# Patient Record
Sex: Male | Born: 1940
Health system: Southern US, Community
[De-identification: ages and names within clinical notes are randomized; demographics above are authoritative.]

## PROBLEM LIST (undated history)

## (undated) DIAGNOSIS — Z86010 Personal history of colonic polyps: Secondary | ICD-10-CM

## (undated) DIAGNOSIS — Z8719 Personal history of other diseases of the digestive system: Secondary | ICD-10-CM

## (undated) DIAGNOSIS — I10 Essential (primary) hypertension: Secondary | ICD-10-CM

## (undated) DIAGNOSIS — R918 Other nonspecific abnormal finding of lung field: Secondary | ICD-10-CM

## (undated) DIAGNOSIS — D499 Neoplasm of unspecified behavior of unspecified site: Secondary | ICD-10-CM

## (undated) DIAGNOSIS — R51 Headache: Secondary | ICD-10-CM

## (undated) DIAGNOSIS — E663 Overweight: Secondary | ICD-10-CM

## (undated) DIAGNOSIS — C801 Malignant (primary) neoplasm, unspecified: Secondary | ICD-10-CM

## (undated) DIAGNOSIS — R519 Headache, unspecified: Secondary | ICD-10-CM

## (undated) DIAGNOSIS — E785 Hyperlipidemia, unspecified: Secondary | ICD-10-CM

## (undated) DIAGNOSIS — Z8669 Personal history of other diseases of the nervous system and sense organs: Secondary | ICD-10-CM

## (undated) DIAGNOSIS — Z87442 Personal history of urinary calculi: Secondary | ICD-10-CM

## (undated) DIAGNOSIS — K219 Gastro-esophageal reflux disease without esophagitis: Secondary | ICD-10-CM

## (undated) DIAGNOSIS — D509 Iron deficiency anemia, unspecified: Secondary | ICD-10-CM

## (undated) HISTORY — DX: Neoplasm of unspecified behavior of unspecified site: D49.9

## (undated) HISTORY — DX: Gastro-esophageal reflux disease without esophagitis: K21.9

## (undated) HISTORY — DX: Other nonspecific abnormal finding of lung field: R91.8

## (undated) HISTORY — DX: Essential (primary) hypertension: I10

## (undated) HISTORY — DX: Personal history of other diseases of the nervous system and sense organs: Z86.69

## (undated) HISTORY — PX: OTHER SURGICAL HISTORY: SHX169

## (undated) HISTORY — DX: Iron deficiency anemia, unspecified: D50.9

## (undated) HISTORY — DX: Overweight: E66.3

## (undated) HISTORY — DX: Hyperlipidemia, unspecified: E78.5

## (undated) HISTORY — DX: Personal history of colonic polyps: Z86.010

---

## 2001-10-20 DIAGNOSIS — E785 Hyperlipidemia, unspecified: Secondary | ICD-10-CM

## 2001-10-20 DIAGNOSIS — I10 Essential (primary) hypertension: Secondary | ICD-10-CM

## 2001-10-20 HISTORY — DX: Hyperlipidemia, unspecified: E78.5

## 2001-10-20 HISTORY — DX: Essential (primary) hypertension: I10

## 2003-02-15 ENCOUNTER — Ambulatory Visit (HOSPITAL_COMMUNITY): Admission: RE | Admit: 2003-02-15 | Discharge: 2003-02-15 | Payer: Self-pay | Admitting: Family Medicine

## 2003-02-15 ENCOUNTER — Encounter: Payer: Self-pay | Admitting: Family Medicine

## 2003-03-22 ENCOUNTER — Ambulatory Visit (HOSPITAL_COMMUNITY): Admission: RE | Admit: 2003-03-22 | Discharge: 2003-03-22 | Payer: Self-pay | Admitting: General Surgery

## 2006-10-20 HISTORY — PX: COLONOSCOPY: SHX174

## 2006-12-28 ENCOUNTER — Ambulatory Visit: Payer: Self-pay | Admitting: Gastroenterology

## 2007-01-05 ENCOUNTER — Encounter (INDEPENDENT_AMBULATORY_CARE_PROVIDER_SITE_OTHER): Payer: Self-pay | Admitting: *Deleted

## 2007-01-05 ENCOUNTER — Ambulatory Visit: Payer: Self-pay | Admitting: Gastroenterology

## 2007-01-05 ENCOUNTER — Ambulatory Visit (HOSPITAL_COMMUNITY): Admission: RE | Admit: 2007-01-05 | Discharge: 2007-01-05 | Payer: Self-pay | Admitting: Gastroenterology

## 2007-01-05 HISTORY — PX: ESOPHAGOGASTRODUODENOSCOPY: SHX1529

## 2007-01-20 ENCOUNTER — Ambulatory Visit (HOSPITAL_COMMUNITY): Admission: RE | Admit: 2007-01-20 | Discharge: 2007-01-20 | Payer: Self-pay | Admitting: Gastroenterology

## 2007-01-20 HISTORY — PX: GIVENS CAPSULE STUDY: SHX5432

## 2007-01-22 ENCOUNTER — Ambulatory Visit: Payer: Self-pay | Admitting: Gastroenterology

## 2007-02-24 ENCOUNTER — Ambulatory Visit (HOSPITAL_COMMUNITY): Admission: RE | Admit: 2007-02-24 | Discharge: 2007-02-24 | Payer: Self-pay | Admitting: Gastroenterology

## 2007-02-24 ENCOUNTER — Ambulatory Visit: Payer: Self-pay | Admitting: Gastroenterology

## 2007-02-24 ENCOUNTER — Encounter: Payer: Self-pay | Admitting: Family Medicine

## 2007-02-24 DIAGNOSIS — Z860100 Personal history of colon polyps, unspecified: Secondary | ICD-10-CM

## 2007-02-24 HISTORY — DX: Personal history of colonic polyps: Z86.010

## 2007-02-24 HISTORY — DX: Personal history of colon polyps, unspecified: Z86.0100

## 2007-07-08 ENCOUNTER — Encounter: Payer: Self-pay | Admitting: Family Medicine

## 2008-03-06 ENCOUNTER — Ambulatory Visit (HOSPITAL_COMMUNITY): Admission: RE | Admit: 2008-03-06 | Discharge: 2008-03-06 | Payer: Self-pay | Admitting: Family Medicine

## 2008-04-10 ENCOUNTER — Ambulatory Visit (HOSPITAL_COMMUNITY): Admission: RE | Admit: 2008-04-10 | Discharge: 2008-04-10 | Payer: Self-pay | Admitting: Family Medicine

## 2008-04-10 ENCOUNTER — Encounter: Payer: Self-pay | Admitting: Family Medicine

## 2008-06-23 ENCOUNTER — Emergency Department (HOSPITAL_COMMUNITY): Admission: EM | Admit: 2008-06-23 | Discharge: 2008-06-23 | Payer: Self-pay | Admitting: Emergency Medicine

## 2008-07-11 ENCOUNTER — Encounter: Payer: Self-pay | Admitting: Family Medicine

## 2008-09-07 ENCOUNTER — Encounter: Payer: Self-pay | Admitting: Family Medicine

## 2008-09-07 ENCOUNTER — Ambulatory Visit (HOSPITAL_COMMUNITY): Admission: RE | Admit: 2008-09-07 | Discharge: 2008-09-07 | Payer: Self-pay | Admitting: Family Medicine

## 2008-09-18 ENCOUNTER — Emergency Department (HOSPITAL_COMMUNITY): Admission: EM | Admit: 2008-09-18 | Discharge: 2008-09-18 | Payer: Self-pay | Admitting: Emergency Medicine

## 2009-03-16 ENCOUNTER — Ambulatory Visit (HOSPITAL_COMMUNITY): Admission: RE | Admit: 2009-03-16 | Discharge: 2009-03-16 | Payer: Self-pay | Admitting: Family Medicine

## 2009-03-16 ENCOUNTER — Encounter: Payer: Self-pay | Admitting: Family Medicine

## 2009-09-24 ENCOUNTER — Encounter: Payer: Self-pay | Admitting: Family Medicine

## 2009-09-24 ENCOUNTER — Ambulatory Visit (HOSPITAL_COMMUNITY): Admission: RE | Admit: 2009-09-24 | Discharge: 2009-09-24 | Payer: Self-pay | Admitting: Family Medicine

## 2010-07-23 ENCOUNTER — Ambulatory Visit: Payer: Self-pay | Admitting: Family Medicine

## 2010-07-23 DIAGNOSIS — R5381 Other malaise: Secondary | ICD-10-CM | POA: Insufficient documentation

## 2010-07-23 DIAGNOSIS — E785 Hyperlipidemia, unspecified: Secondary | ICD-10-CM | POA: Insufficient documentation

## 2010-07-23 DIAGNOSIS — I10 Essential (primary) hypertension: Secondary | ICD-10-CM | POA: Insufficient documentation

## 2010-07-23 DIAGNOSIS — R5383 Other fatigue: Secondary | ICD-10-CM

## 2010-07-30 ENCOUNTER — Encounter: Payer: Self-pay | Admitting: Physician Assistant

## 2010-08-09 ENCOUNTER — Encounter (INDEPENDENT_AMBULATORY_CARE_PROVIDER_SITE_OTHER): Payer: Self-pay | Admitting: *Deleted

## 2010-08-09 LAB — CONVERTED CEMR LAB
ALT: 17 units/L
ALT: 17 units/L (ref 0–53)
AST: 18 units/L
AST: 18 units/L (ref 0–37)
Albumin: 4.6 g/dL
Albumin: 4.6 g/dL (ref 3.5–5.2)
Alkaline Phosphatase: 49 units/L
Alkaline Phosphatase: 49 units/L (ref 39–117)
BUN: 15 mg/dL
BUN: 15 mg/dL (ref 6–23)
Bilirubin, Direct: 52 mg/dL
CO2: 27 meq/L
CO2: 27 meq/L (ref 19–32)
Calcium: 9.4 mg/dL
Calcium: 9.4 mg/dL (ref 8.4–10.5)
Chloride: 106 meq/L
Chloride: 106 meq/L (ref 96–112)
Cholesterol: 211 mg/dL — ABNORMAL HIGH (ref 0–200)
Creatinine, Ser: 1.37 mg/dL
Creatinine, Ser: 1.37 mg/dL (ref 0.40–1.50)
Glomerular Filtration Rate, Af Am: >60 mL/min/{1.73_m2}
Glucose, Bld: 102 mg/dL
Glucose, Bld: 102 mg/dL — ABNORMAL HIGH (ref 70–99)
HCT: 39.8 % (ref 39.0–52.0)
HDL: 48 mg/dL (ref >39)
Hemoglobin: 12.9 g/dL — ABNORMAL LOW (ref 13.0–17.0)
LDL Cholesterol: 127 mg/dL — ABNORMAL HIGH (ref 0–99)
MCHC: 32.4 g/dL (ref 30.0–36.0)
MCV: 88.1 fL (ref 78.0–100.0)
PSA: 0.5 ng/mL (ref 0.10–4.00)
Platelets: 229 10*3/uL (ref 150–400)
Potassium: 4.4 meq/L
Potassium: 4.4 meq/L (ref 3.5–5.3)
RBC: 4.52 M/uL (ref 4.22–5.81)
RDW: 14.3 % (ref 11.5–15.5)
Sodium: 142 meq/L
Sodium: 142 meq/L (ref 135–145)
TSH: 2.003 microintl units/mL (ref 0.350–4.500)
Total Bilirubin: 0.5 mg/dL (ref 0.3–1.2)
Total CHOL/HDL Ratio: 4.4
Total Protein: 6.8 g/dL
Total Protein: 6.8 g/dL (ref 6.0–8.3)
Triglycerides: 181 mg/dL — ABNORMAL HIGH (ref <150)
VLDL: 36 mg/dL (ref 0–40)
WBC: 7.3 10*3/uL (ref 4.0–10.5)

## 2010-08-12 ENCOUNTER — Ambulatory Visit: Payer: Self-pay | Admitting: Family Medicine

## 2010-08-12 LAB — CONVERTED CEMR LAB
Bilirubin Urine: NEGATIVE
Glucose, Urine, Semiquant: NEGATIVE
Ketones, urine, test strip: NEGATIVE
Nitrite: NEGATIVE
OCCULT 1: NEGATIVE
Protein, U semiquant: NEGATIVE
Specific Gravity, Urine: 1.025
Urobilinogen, UA: 0.2
WBC Urine, dipstick: NEGATIVE
pH: 5.5

## 2010-08-14 ENCOUNTER — Encounter: Payer: Self-pay | Admitting: Family Medicine

## 2010-08-14 LAB — CONVERTED CEMR LAB
Bacteria, UA: NONE SEEN
Bilirubin Urine: NEGATIVE
Casts: NONE SEEN /lpf
Crystals: NONE SEEN
Hemoglobin, Urine: NEGATIVE
Ketones, ur: NEGATIVE mg/dL
Leukocytes, UA: NEGATIVE
Nitrite: NEGATIVE
Protein, ur: NEGATIVE mg/dL
Specific Gravity, Urine: 1.021 (ref 1.005–1.030)
Squamous Epithelial / LPF: NONE SEEN /lpf
Urine Glucose: NEGATIVE mg/dL
Urobilinogen, UA: 0.2 (ref 0.0–1.0)
pH: 5.5 (ref 5.0–8.0)

## 2010-08-18 DIAGNOSIS — J984 Other disorders of lung: Secondary | ICD-10-CM

## 2010-08-18 DIAGNOSIS — R930 Abnormal findings on diagnostic imaging of skull and head, not elsewhere classified: Secondary | ICD-10-CM

## 2010-08-18 DIAGNOSIS — R9431 Abnormal electrocardiogram [ECG] [EKG]: Secondary | ICD-10-CM

## 2010-08-18 HISTORY — DX: Other disorders of lung: J98.4

## 2010-08-18 HISTORY — DX: Abnormal findings on diagnostic imaging of skull and head, not elsewhere classified: R93.0

## 2010-08-18 HISTORY — DX: Abnormal electrocardiogram (ECG) (EKG): R94.31

## 2010-08-19 ENCOUNTER — Encounter: Payer: Self-pay | Admitting: Family Medicine

## 2010-08-19 ENCOUNTER — Encounter: Payer: Self-pay | Admitting: Cardiology

## 2010-08-21 ENCOUNTER — Encounter (INDEPENDENT_AMBULATORY_CARE_PROVIDER_SITE_OTHER): Payer: Self-pay | Admitting: *Deleted

## 2010-08-27 ENCOUNTER — Ambulatory Visit: Payer: Self-pay | Admitting: Cardiology

## 2010-08-30 ENCOUNTER — Ambulatory Visit (HOSPITAL_COMMUNITY): Admission: RE | Admit: 2010-08-30 | Discharge: 2010-08-30 | Payer: Self-pay | Admitting: Family Medicine

## 2010-09-09 ENCOUNTER — Telehealth: Payer: Self-pay | Admitting: Family Medicine

## 2010-10-18 ENCOUNTER — Encounter: Payer: Self-pay | Admitting: Family Medicine

## 2010-10-18 ENCOUNTER — Telehealth: Payer: Self-pay | Admitting: Family Medicine

## 2010-10-18 ENCOUNTER — Ambulatory Visit
Admission: RE | Admit: 2010-10-18 | Discharge: 2010-10-18 | Payer: Self-pay | Source: Home / Self Care | Attending: Family Medicine | Admitting: Family Medicine

## 2010-11-19 NOTE — Progress Notes (Signed)
Summary: medicine  Phone Note Call from Patient   Summary of Call: needs his simas. and his lis. called in at [facility] in [place] and 90 day supply please  left message Initial call taken by: Lind Guest,  September 09, 2010 8:18 AM    Prescriptions: LISINOPRIL 40 MG TABS (LISINOPRIL) Take 1 tablet by mouth once a day  #90 x 0   Entered by:   Adella Hare LPN   Authorized by:   Syliva Overman MD   Signed by:   Adella Hare LPN on 19/14/7829   Method used:   Electronically to        [facility]  [address] (retail)       [address]       [place], [place]  [address]       Ph: [phone]       Fax: [phone]   RxID:   [id number] SIMVASTATIN 40 MG TABS (SIMVASTATIN) Take 1 tab by mouth at bedtime  #90 x 0   Entered by:   Adella Hare LPN   Authorized by:   Syliva Overman MD   Signed by:   Adella Hare LPN on 74/25/9563   Method used:   Electronically to        [facility]  [address] (retail)       [address]       [place], [place]  [address]       Ph: [phone]       Fax: [phone]   RxID:   [id number]

## 2010-11-19 NOTE — Assessment & Plan Note (Signed)
Summary: ***NP3 ABNORMAL EKG   Visit Type:  Initial Consult Primary Provider:  Syliva Overman MD   History of Present Illness: [name] is evaluated in the office today at the kind request of Dr. Garnette [name] for assessment of EKG abnormalities.  This nice gentleman has enjoyed generally excellent health.  He has no known cardiac disease, has never been evaluated by a cardiologist nor has he undergone any significant cardiac testing.  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  He has a history of hypertension and hyperlipidemia, both of which have been well-controlled.  He has no known cerebrovascular nor peripheral vascular disease nor any symptoms of same.  A recent EKG in Dr. Anthony [name] office was interpreted as demonstrating atrial and ventricular ectopy.  This tracing as well as other records were obtained and reviewed from Dr. Lodema [name].  There was a sinus arrhythmia and artifact, but no ectopy.  The tracing was otherwise normal except for a borderline first-degree AV block and right bundle branch block pattern.  Current Medications (verified): 1)  Simvastatin 40 Mg Tabs (Simvastatin) .... Take 1 Tab By Mouth At Bedtime 2)  Lisinopril 40 Mg Tabs (Lisinopril) .... Take 1 Tablet By Mouth Once A Day 3)  Aspirin 81 Mg Tbec (Aspirin) .... Take 1 Tablet By Mouth Once A Day 4)  Multivitamins  Tabs (Multiple Vitamin) .... Take 1 Tablet By Mouth Once A Day  Allergies: No Known Drug Allergies  Comments:  Nurse/Medical Assistant: patient stated that all meds are correct with what TylerSimpson has listed [facility] [place]  Past History:  Past Medical History: Last updated: 08/12/2010 Hypertension dx 03-10-02  (approx) Hyperlipidemia dx  Mar 10, 2002 (approx) Migraines in childhood, flared up in 03/10/08, and have responded to depakote which was stopped in 9 months, none since Cataract in 03-10-2010, 1  eye lung nodules since 03/10/2008, needs rept scan in 08/2010 skin cancer, pt report, old note says seborreic keratoses, will request path  dx in 11-Mar-2007 two lesions removed, [place] Obesity, dx 03-10-2010  Family History: Last updated: 08/12/2010 Mother-living-Pacemaker, Alzheimers age 46 in Mar 10, 2010 Father-deceased-Heart failure, died in late 39's 2 sisters-healthy 1 brother-bradycardia  Social History: Last updated: 08/12/2010 Occupation: retired Scientist, physiological Married x twice, current is 45 yrs Quit nicoine 10-Mar-2005 chewing Alcohol use-no Drug use-no Regular exercise-yes  Past Surgical History: Removal of skin cancer twice Colonoscopy in 2008-excision of 2 small benign polyps  EKG  Procedure date:  08/27/2010  Findings:      Normal sinus rhythm PR interval at the upper limit of normal Right bundle branch block pattern Early R wave progression-possible lead placement error No significant change since a previous tracing of 08/12/10   Review of Systems       occasional headaches; develops motion sickness; requires corrective lenses; all other systems reviewed and are negative.  Vital Signs:  Patient profile:   70 year old male Weight:      201 pounds BMI:     29.79 Pulse rate:   74 / minute BP sitting:   122 / 80  (right arm)  Vitals Entered By: [name] Saa, CNA (August 27, 2010 1:09 PM)  Physical Exam  General:   General-Well-developed; no acute distress: HEENT-[place]/AT; PERRL; EOM intact; conjunctiva and lids nl:  Neck-No JVD; no carotid bruits: Endocrine-No thyromegaly: Lungs-No tachypnea, clear without rales, rhonchi or wheezes: CV-normal PMI; normal S1 and S2:;  Abdomen-BS normal; soft and non-tender without  masses or organomegaly: MS-No deformities, cyanosis or clubbing: Neurologic-Nl cranial nerves; symmetric strength and tone: Skin- Warm, no sig. lesions: Extremities-Nl distal pulses; no edema    Impression & Recommendations:  Problem # 1:   ELECTROCARDIOGRAM, ABNORMAL (ICD-794.31) Mr. [name] does have a number of cardiovascular risk factors, but no symptoms suggesting myocardial ischemia.  His EKG abnormalities are insignificant.  He requires no additional cardiology followup nor cardiac testing.  Continued effective therapy for hyperlipidemia and hypertension is appropriate and will be left to the discretion of Dr. Lodema [name], who has done well in this regard to date.  I will be available to assist with [name] [name]'s care as needed in the future.

## 2010-11-19 NOTE — Miscellaneous (Signed)
Summary: labs cmp 08/09/2010  Clinical Lists Changes  Observations: Added new observation of CALCIUM: 9.4 mg/dL (09/81/1914 7:82) Added new observation of ALBUMIN: 4.6 g/dL (95/62/1308 6:57) Added new observation of PROTEIN, TOT: 6.8 g/dL (84/69/6295 2:84) Added new observation of SGPT (ALT): 17 units/L (08/09/2010 8:39) Added new observation of SGOT (AST): 18 units/L (08/09/2010 8:39) Added new observation of ALK PHOS: 49 units/L (08/09/2010 8:39) Added new observation of BILI DIRECT: 52 mg/dL (13/24/4010 2:72) Added new observation of GFR AA: >60 mL/min/1.39m2 (08/09/2010 8:39) Added new observation of CREATININE: 1.37 mg/dL (53/66/4403 4:74) Added new observation of BUN: 15 mg/dL (25/95/6387 5:64) Added new observation of BG RANDOM: 102 mg/dL (33/29/5188 4:16) Added new observation of CO2 PLSM/SER: 27 meq/L (08/09/2010 8:39) Added new observation of CL SERUM: 106 meq/L (08/09/2010 8:39) Added new observation of K SERUM: 4.4 meq/L (08/09/2010 8:39) Added new observation of NA: 142 meq/L (08/09/2010 8:39)

## 2010-11-19 NOTE — Letter (Signed)
Summary: [facility]   Imported By: Lind Guest 08/19/2010 14:53:02  _____________________________________________________________________  External Attachment:    Type:   Image     Comment:   External Document

## 2010-11-19 NOTE — Assessment & Plan Note (Signed)
Summary: NEW  PATIENT  PER DR BRENDAS HUSBAND room 1   Vital Signs:  Patient profile:   70 year old male Height:      69 inches Weight:      210 pounds BMI:     31.12 O2 Sat:      98 % Pulse rate:   73 / minute Resp:     16 per minute BP sitting:   114 / 74  (left arm) Cuff size:   large  Vitals Entered By: Everitt Amber LPN CC: New Patient   CC:  New Patient.  History of Present Illness: New pt here to establish care with new PCP. Pt states he has had cold symptoms x approx 1 week and no improvement.  Reports nasal congestion and post nasal drainage. Mucus is mostly clear.  He thinks this was triggered by baling hay.  Little cough, nonprod.  No fever or chills.  Has been using Excedrin and no help.  Has an appt this mos with Dr Lodema Hong for CPE.  Has been 1 yr since his previous. Hx of htn. Taking medications as prescribed and denies side effects.  No headache, chest pain or palpitations.    Current Medications (verified): 1)  Simvastatin 40 Mg Tabs (Simvastatin) .... Take 1 Tab By Mouth At Bedtime 2)  Lisinopril 40 Mg Tabs (Lisinopril) .... Take 1 Tablet By Mouth Once A Day  Allergies (verified): No Known Drug Allergies  Past History:  Past medical, surgical, family and social histories (including risk factors) reviewed for relevance to current acute and chronic problems.  Past Medical History: Hypertension Hyperlipidemia  Past Surgical History: Denies surgical history  Family History: Reviewed history and no changes required. Mother-living-Pacemaker, Alzheimers Father-deceased-Heart failure 2 sisters-healthy 1 brother-bradycardia  Social History: Reviewed history and no changes required. Occupation: retired Married Never Smoked Alcohol use-no Drug use-no Regular exercise-yes Occupation:  employed Smoking Status:  never Drug Use:  no Does Patient Exercise:  yes  Review of Systems General:  Denies chills and fever. ENT:  Complains of nasal  congestion and postnasal drainage; denies earache, sinus pressure, and sore throat. CV:  Denies chest pain or discomfort and palpitations. Resp:  Complains of cough; denies shortness of breath, sputum productive, and wheezing.  Physical Exam  General:  Well-developed,well-nourished,in no acute distress; alert,appropriate and cooperative throughout examination Head:  Normocephalic and atraumatic without obvious abnormalities. No apparent alopecia or balding. Ears:  External ear exam shows no significant lesions or deformities.  Otoscopic examination reveals clear canals, tympanic membranes are intact bilaterally without bulging, retraction, inflammation or discharge. Hearing is grossly normal bilaterally. Nose:  External nasal examination shows no deformity or inflammation. Nasal mucosa are pink and moist without lesions or exudates. Mouth:  Oral mucosa and oropharynx without lesions or exudates. Clear PND noted. Teeth in good repair. Neck:  No deformities, masses, or tenderness noted. Lungs:  Normal respiratory effort, chest expands symmetrically. Lungs are clear to auscultation, no crackles or wheezes. Heart:  Normal rate and regular rhythm. S1 and S2 normal without gallop, murmur, click, rub or other extra sounds. Cervical Nodes:  No lymphadenopathy noted Psych:  Cognition and judgment appear intact. Alert and cooperative with normal attention span and concentration. No apparent delusions, illusions, hallucinations   Impression & Recommendations:  Problem # 1:  SINUSITIS, ACUTE (ICD-461.9) Assessment New  His updated medication list for this problem includes:    Amoxicillin 875 Mg Tabs (Amoxicillin) .Marland Kitchen... Take 1 two times a day x 10 days  Orders:  Depo- Medrol 80mg  (J1040) Admin of Therapeutic Inj  intramuscular or subcutaneous ([address])  Problem # 2:  ESSENTIAL HYPERTENSION (ICD-401.9) Assessment: Comment Only  His updated medication list for this problem includes:    Lisinopril 40  Mg Tabs (Lisinopril) .Marland Kitchen... Take 1 tablet by mouth once a day  Orders: T-CMP with estimated GFR ([address])  BP today: 114/74  Problem # 3:  HYPERLIPIDEMIA (ICD-272.4) Assessment: Comment Only  His updated medication list for this problem includes:    Simvastatin 40 Mg Tabs (Simvastatin) .Marland Kitchen... Take 1 tab by mouth at bedtime  Orders: T-CMP with estimated GFR ([address]) T-Lipid Profile (62130-86578)  Complete Medication List: 1)  Simvastatin 40 Mg Tabs (Simvastatin) .... Take 1 tab by mouth at bedtime 2)  Lisinopril 40 Mg Tabs (Lisinopril) .... Take 1 tablet by mouth once a day 3)  Amoxicillin 875 Mg Tabs (Amoxicillin) .... Take 1 two times a day x 10 days  Other Orders: T-CBC No Diff (46962-95284) T-PSA (13244-01027) T-TSH (25366-44034)  Patient Instructions: 1)  Keep your next appt with DrSimpson for your physical. 2)  I have ordered blood work for you to have drawn fasting before your physical. 3)  I have prescribed an antibiotic for you. 4)  You have received a shot of Depo Medrol to help with your congestion. 5)  I suggest you take Loratadine 10 mg once a day for congestion. Prescriptions: AMOXICILLIN 875 MG TABS (AMOXICILLIN) take 1 two times a day x 10 days  #20 x 0   Entered and Authorized by:   Esperanza Sheets PA   Signed by:   Esperanza Sheets PA on 07/23/2010   Method used:   Electronically to        [facility]  [address] (retail)       [address]       [place], [place]  [address]       Ph: [phone]       Fax: [phone]   RxID:   [id number]    Medication Administration  Injection # 1:    Medication: Depo- Medrol 80mg     Diagnosis: SINUSITIS, ACUTE (ICD-461.9)    Route: IM    Site: RUOQ gluteus    Exp Date: 03/2011    Lot #: [id number]     Mfr: [employer]    Comments: 80mg  given     Patient tolerated injection without complications    Given by: Everitt Amber LPN (July 23, 2010 10:47 AM)  Orders Added: 1)  T-CMP with estimated  GFR [[address]] 2)  T-Lipid Profile [80061-22930] 3)  T-CBC No Diff [85027-10000] 4)  T-PSA [32202-54270] 5)  T-TSH [62376-28315] 6)  Depo- Medrol 80mg  [J1040] 7)  Admin of Therapeutic Inj  intramuscular or subcutaneous [96372] 8)  New Patient Level III [17616]

## 2010-11-19 NOTE — Assessment & Plan Note (Signed)
Summary: PHY   Vital Signs:  Patient profile:   70 year old male Height:      69 inches Weight:      207.75 pounds BMI:     30.79 O2 Sat:      97 % on Room air Pulse rate:   82 / minute Pulse rhythm:   regular Resp:     16 per minute BP sitting:   140 / 102  (left arm)  Vitals Entered By: Mauricia Area CMA (August 12, 2010 1:21 PM)  Nutrition Counseling: Patient's BMI is greater than 25 and therefore counseled on weight management options.  O2 Flow:  Room air  CC: Follow up  Vision Screening:Left eye w/o correction: 20 / 25 Right Eye w/o correction: 20 / 20 Both eyes w/o correction:  20/ 20         CC:  Follow up.  History of Present Illness: New pt evaluation for this  very pleeasant male with a h/o HTN and hyperlipidemia, well controlled. He is active, but not dedicated to regular exercise for good health, and his diet is reportedly too heavilyu laden with carbs and sweets. He has a past h/o skin cancer also. He is reminded to avoid excessive sun exposure, use sunscreen and receive annual dermatology evaluations. Reports  that he has generally been doing well. Denies recent fever or chills. Denies sinus pressure, nasal congestion , ear pain or sore throat. Denies chest congestion, or cough productive of sputum. Denies chest pain, palpitations, PND, orthopnea or leg swelling. Denies abdominal pain, nausea, vomitting, diarrhea or constipation. Denies change in bowel movements or bloody stool. Denies dysuria , frequency, incontinence or hesitancy. Denies  joint pain, swelling, or reduced mobility. Denies headaches, vertigo, seizures. Denies depression, anxiety or insomnia. Denies  rash, lesions, or itch.     Current Medications (verified): 1)  Simvastatin 40 Mg Tabs (Simvastatin) .... Take 1 Tab By Mouth At Bedtime 2)  Lisinopril 40 Mg Tabs (Lisinopril) .... Take 1 Tablet By Mouth Once A Day  Allergies (verified): No Known Drug Allergies  Past  History:  Past medical, surgical, family and social histories (including risk factors) reviewed, and no changes noted (except as noted below).  Past Medical History: Hypertension dx 03/02/02  (approx) Hyperlipidemia dx  Mar 02, 2002 (approx) Migraines in childhood, flared up in Mar 02, 2008, and have responded to depakote which was stopped in 9 months, none since Cataract in Mar 02, 2010, 1 eye lung nodules since 03-02-08, needs rept scan in 08/2010 skin cancer, pt report, old note says seborreic keratoses, will request path  dx in 03/03/2007 two lesions removed, [place] Obesity, dx 03/02/10  Past Surgical History: removal of skin cancer twice  Family History: Reviewed history from 07/23/2010 and no changes required. Mother-living-Pacemaker, Alzheimers age 8 in Mar 02, 2010 Father-deceased-Heart failure, died in late 40's 2 sisters-healthy 1 brother-bradycardia  Social History: Reviewed history from 07/23/2010 and no changes required. Occupation: retired Scientist, physiological Married x twice, current is 45 yrs Quit nicoine 03-02-2005 chewing Alcohol use-no Drug use-no Regular exercise-yes  Review of Systems      See HPI General:  Denies sleep disorder, sweats, weakness, and weight loss. Eyes:  Denies blurring, discharge, eye pain, and red eye. Endo:  Denies cold intolerance, excessive thirst, excessive urination, and heat intolerance. Heme:  Denies abnormal bruising and bleeding. Allergy:  Denies hives or rash and itching eyes.  Physical Exam  General:  Well-developed,well-nourished,in no acute distress; alert,appropriate and cooperative throughout examination Head:  Normocephalic and atraumatic without obvious abnormalities. No  apparent alopecia or balding. Eyes:  No corneal or conjunctival inflammation noted. EOMI. Perrla. Funduscopic exam benign, without hemorrhagesor  exudates  Vision grossly normal. Ears:  External ear exam shows no significant lesions or deformities.  Otoscopic examination reveals clear canals, tympanic  membranes are intact bilaterally without bulging, retraction, inflammation or discharge. Hearing is grossly normal bilaterally. Nose:  External nasal examination shows no deformity or inflammation. Nasal mucosa are pink and moist without lesions or exudates. Mouth:  Oral mucosa and oropharynx without lesions or exudates.  Teeth in good repair. Neck:  No deformities, masses, or tenderness noted. Chest Wall:  No deformities, masses, tenderness or gynecomastia noted. Breasts:  No masses or gynecomastia noted Lungs:  Normal respiratory effort, chest expands symmetrically. Lungs are clear to auscultation, no crackles or wheezes. Heart:  Normal rate and regular rhythm. S1 and S2 normal without gallop, murmur, click, rub or other extra sounds. Abdomen:  Bowel sounds positive,abdomen soft and non-tender without masses, organomegaly or hernias noted. Rectal:  No external abnormalities noted. Normal sphincter tone. No rectal masses or tenderness. Genitalia:  Testes bilaterally descended without nodularity, tenderness or masses. No scrotal masses or lesions. No penis lesions or urethral discharge. Prostate:  Prostate gland firm and smooth, no enlargement, nodularity, tenderness, mass, asymmetry or induration. Msk:  No deformity or scoliosis noted of thoracic or lumbar spine.   Pulses:  R and L carotid,radial,femoral,dorsalis pedis and posterior tibial pulses are full and equal bilaterally Extremities:  No clubbing, cyanosis, edema, or deformity noted with normal full range of motion of all joints.   Neurologic:  No cranial nerve deficits noted. Station and gait are normal. Plantar reflexes are down-going bilaterally. DTRs are symmetrical throughout. Sensory, motor and coordinative functions appear intact. Skin:  Intact multiple freckles, no suspiscious lesions Cervical Nodes:  No lymphadenopathy noted Axillary Nodes:  No palpable lymphadenopathy Inguinal Nodes:  No significant adenopathy Psych:  Cognition  and judgment appear intact. Alert and cooperative with normal attention span and concentration. No apparent delusions, illusions, hallucinations   Impression & Recommendations:  Problem # 1:  ESSENTIAL HYPERTENSION (ICD-401.9) Assessment Comment Only  His updated medication list for this problem includes:    Lisinopril 40 Mg Tabs (Lisinopril) .Marland Kitchen... Take 1 tablet by mouth once a day Patient advised to follow low sodium diet rich in fruit and vegetables, and to commit to at least 30 minutes 5 days per week of regular exercise , to improve blood presure control.   BP today: 140/102 Prior BP: 114/74 (07/23/2010)  Labs Reviewed: Chol: 211 (07/30/2010)   HDL: 48 (07/30/2010)   LDL: 127 (07/30/2010)   TG: 181 (07/30/2010)  Orders: T-Basic Metabolic Panel [id number]) Urinalysis (81003-65000) EKG w/ Interpretation (93000)no acute ischemia, however conduction abn with possible prolonged qt, will request card eval Medicare Electronic Prescription [phone])  Problem # 2:  HYPERLIPIDEMIA (ICD-272.4)    HDL:48 (07/30/2010)  LDL:127 (07/30/2010)  Chol:211 (07/30/2010)  Trig:181 (07/30/2010) Low fat dietdiscussed and encouraged pt to resume lipid lowering med also  Problem # 3:  OBESITY, UNSPECIFIED (ICD-278.00) Assessment: Comment Only  Ht: 69 (08/12/2010)   Wt: 207.75 (08/12/2010)   BMI: 30.79 (08/12/2010) therapeutic lifestyle change discussed and encouraged  Problem # 4:  LUNG NODULE (ICD-518.89) Assessment: Comment Only  Orders: Radiology Referral (Radiology) h/o multiple pulmonary nodules which have been fololwed radiologically for at leaset 2 yrs, recommended 2011 f/u in Nov  Problem # 5:  CT, CHEST, ABNORMAL (ICD-793.1) Assessment: Comment Only  Orders: Radiology Referral (Radiology) ground glass opacity ,  needs f/u if reptsd again will be referring to pulmonary specialist or thoracic surgeon also  Problem # 6:  Preventive Health Care (ICD-V70.0) Assessment: Comment  Only pneumovac documented in old record, TDaP not seen. Need to find colonscvopy also  Complete Medication List: 1)  Simvastatin 40 Mg Tabs (Simvastatin) .... Take 1 tab by mouth at bedtime 2)  Lisinopril 40 Mg Tabs (Lisinopril) .... Take 1 tablet by mouth once a day 3)  Simvastatin 40 Mg Tabs (Simvastatin) .... Take 1 tab by mouth at bedtime 4)  Lisinopril 40 Mg Tabs (Lisinopril) .... Take 1 tablet by mouth once a day 5)  Aspirin 81 Mg Tbec (Aspirin) .... Take 1 tablet by mouth once a day 6)  Multivitamins Tabs (Multiple vitamin) .... Take 1 tablet by mouth once a day  Other Orders: T-Lipid Profile [phone]) T- Hemoglobin A1C (09811-91478) T-Hepatic Function (29562-13086) Influenza Vaccine MCR (57846) Hemoccult Guaiac-1 spec.(in office) (96295) T-Urinalysis with Culture Reflex (28413) Cardiology Referral (Cardiology)  Patient Instructions: 1)  Please schedule a follow-up appointment in 4 months. 2)  pls follow a low fat diet rich in fresh or frozen fruit and veg. Cut back on red meat. 3)     4)  It is important that you exercise regularly at least 40 minutes 5 times a week. If you develop chest pain, have severe difficulty breathing, or feel very tired , stop exercising immediately and seek medical attention. 5)  You need to lose weight. Consider a lower calorie diet and regular exercise. Goal is 10 pounds or more. 6)  pls start 1 multivitamin and one baby asprin 81mg  daily 7)  BMP prior to visit, ICD-9: 8)  Hepatic Panel prior to visit, ICD-9: 9)  Lipid Panel prior to visit, ICD-9:  fasting in 4 months 10)  Flu vac today 11)  HbgA1C prior to visit, ICD-9: Prescriptions: LISINOPRIL 40 MG TABS (LISINOPRIL) Take 1 tablet by mouth once a day  #90 x 1   Entered and Authorized by:   Syliva Overman MD   Signed by:   Syliva Overman MD on 08/12/2010   Method used:   Electronically to        [facility] [address] [facility]retail)       [address]       [place], [place]  [address]       Ph: [phone]       Fax: [phone]   RxID:   [id number] SIMVASTATIN 40 MG TABS (SIMVASTATIN) Take 1 tab by mouth at bedtime  #90 x 1   Entered and Authorized by:   Syliva Overman MD   Signed by:   Syliva Overman MD on 08/12/2010   Method used:   Electronically to        [facility] [address] [facility] (retail)       [address]       [place], [place]  [address]       Ph: [phone]       Fax: [phone]   RxID:   [id number]    Orders Added: 1)  T-Basic Metabolic Panel [phone] 2)  T-Lipid Profile [80061-22930] 3)  T- Hemoglobin A1C [83036-23375] 4)  T-Hepatic Function [80076-22960] 5)  Influenza Vaccine MCR [00025] 6)  Hemoccult Guaiac-1 spec.(in office) [82270] 7)  Urinalysis [81003-65000] 8)  T-Urinalysis with Culture Reflex [65001] 9)  Est. Patient 65& > [99397] 10)  EKG  w/ Interpretation [93000] 11)  Medicare Electronic Prescription [G8553] 12)  Radiology Referral [Radiology] 23)  Cardiology Referral [Cardiology]   Immunizations Administered:  Influenza Vaccine # 1:    Vaccine Type: Fluvax MCR    Site: left deltoid    Mfr: novartis    Dose: 0.5 ml    Route: IM    Given by: Adella Hare LPN    Exp. Date: 02/2011    Lot #: [id number]    VIS given: 05/14/10 version given August 12, 2010.   Immunizations Administered:  Influenza Vaccine # 1:    Vaccine Type: Fluvax MCR    Site: left deltoid    Mfr: novartis    Dose: 0.5 ml    Route: IM    Given by: Adella Hare LPN    Exp. Date: 02/2011    Lot #: [id number]    VIS given: 05/14/10 version given August 12, 2010.    Laboratory Results   Urine Tests  Date/Time Received: August 12, 2010 3:52 PM  Date/Time Reported: August 12, 2010 3:52 PM   Routine Urinalysis   Color: yellow Appearance: Clear Glucose: negative   (Normal Range: Negative) Bilirubin: negative   (Normal Range: Negative) Ketone: negative   (Normal Range: Negative) Spec.  Gravity: 1.025   (Normal Range: 1.003-1.035) Blood: trace-lysed   (Normal Range: Negative) pH: 5.5   (Normal Range: 5.0-8.0) Protein: negative   (Normal Range: Negative) Urobilinogen: 0.2   (Normal Range: 0-1) Nitrite: negative   (Normal Range: Negative) Leukocyte Esterace: negative   (Normal Range: Negative)    Date/Time Received: August 12, 2010 3:33 PM  Date/Time Reported: August 12, 2010 3:33 PM   Stool - Occult Blood Hemmoccult #1: negative Date: 08/12/2010 Comments: 50201 10L 02/13 118 10/12 Adella Hare LPN  August 12, 2010 3:33 PM      Appended Document: PHY pls explain to pt after reviewing his record he needs another chest st scan ,and i think it best to have a cardiologist review his ekg. Also it apopears he has had a pneumovac , but no record of TDap so he needs that . we also need to locate his colonscopy pls send for it and lv in my box, thanks  Appended Document: PHY called patient, left message  Appended Document: PHY patient has cardiology appt, advised patient to come by as a nurse visit for tdap in the future, do we need to schedule chest ct?  Appended Document: PHY yes he needs a chest CT should be in referral box i will put in if not

## 2010-11-19 NOTE — Procedures (Signed)
Summary: COLONOSCOPY  COLONOSCOPY   Imported By: Lind Guest 08/20/2010 10:44:48  _____________________________________________________________________  External Attachment:    Type:   Image     Comment:   External Document

## 2010-11-21 NOTE — Progress Notes (Signed)
Summary: please advise appt.  Phone Note Call from Patient   Summary of Call: wife called in and states he can't hardly talk, coughing sneezing sinus stopped up and she wants him to come in and be seen real quick I advised patient's wife we didn't have any appt. for today and then she said " well Luann will work [place] in and that they always work them in when he or she needs to be seen" I advised wife I would take a msg. Initial call taken by: Curtis Sites,  October 18, 2010 10:53 AM  Follow-up for Phone Call        returned call, left message Follow-up by: Adella Hare LPN,  October 18, 2010 11:19 AM  Additional Follow-up for Phone Call Additional follow up Details #1::        if coughing and sneezing, no fever , chills or green sputum, i suggest, prednisone doe pack which I am willing to send in without office eval , for uncontrolled allergies, if he has green nasal drainage or green mucus, he needs antibiotic and will need evaluation Additional Follow-up by: Syliva Overman MD,  October 18, 2010 11:28 AM    Additional Follow-up for Phone Call Additional follow up Details #2::    patient being seen in office today Follow-up by: Adella Hare LPN,  October 18, 2010 11:39 AM

## 2010-11-21 NOTE — Assessment & Plan Note (Signed)
Summary: work in/slj   Vital Signs:  Patient profile:   70 year old male Height:      69 inches Weight:      204.50 pounds BMI:     30.31 O2 Sat:      98 % on Room air Pulse rate:   80 / minute Pulse rhythm:   regular Resp:     16 per minute BP sitting:   100 / 70  (left arm)  Vitals Entered By: Adella Hare LPN (October 18, 2010 11:36 AM)  Nutrition Counseling: Patient's BMI is greater than 25 and therefore counseled on weight management options.  O2 Flow:  Room air CC: sinus congestion, cough, diarrhea, sore throat, all week Is Patient Diabetic? No   Primary Care Sarissa Dern:  Syliva Overman MD  CC:  sinus congestion, cough, diarrhea, sore throat, and all week.  History of Present Illness: 5 day h/o head and chest congestion , chills yellow sputum and nasal drainage.  body aches, no established fever. Pt also reports diarreah earlier this week. Reports  that prior to this he had been  doing well.  Denies chest pain, palpitations, PND, orthopnea or leg swelling. Denies abdominal pain, nausea, vomitting, diarrhea or constipation. Denies change in bowel movements or bloody stool. Denies dysuria , frequency, incontinence or hesitancy. Denies  joint pain, swelling, or reduced mobility. Denies headaches, vertigo, seizures. Denies depression, anxiety or insomnia. Denies  rash, lesions, or itch.     Current Medications (verified): 1)  Simvastatin 40 Mg Tabs (Simvastatin) .... Take 1 Tab By Mouth At Bedtime 2)  Lisinopril 40 Mg Tabs (Lisinopril) .... Take 1 Tablet By Mouth Once A Day 3)  Aspirin 81 Mg Tbec (Aspirin) .... Take 1 Tablet By Mouth Once A Day 4)  Multivitamins  Tabs (Multiple Vitamin) .... Take 1 Tablet By Mouth Once A Day  Allergies (verified): No Known Drug Allergies  Review of Systems      See HPI General:  Complains of chills, fatigue, and malaise. Eyes:  Denies blurring and discharge. Endo:  Denies cold intolerance, excessive hunger, excessive  thirst, and excessive urination. Heme:  Denies abnormal bruising and bleeding. Allergy:  Complains of seasonal allergies; denies hives or rash and itching eyes.  Physical Exam  General:  Well-developed,well-nourished,in no acute distress; alert,appropriate and cooperative throughout examination. ill appearing and hoarse. HEENT: No facial asymmetry,  EOMI, maxillary  sinus tenderness, TM's Clear, oropharynx  pink and moist.   Chest: decreased air entry, bilateral crackles, no wheezes CVS: S1, S2, No murmurs, No S3.   Abd: Soft, Nontender.  MS: Adequate ROM spine, hips, shoulders and knees.  Ext: No edema.   CNS: CN 2-12 intact, power tone and sensation normal throughout.   Skin: Intact, no visible lesions or rashes.  Psych: Good eye contact, normal affect.  Memory intact, not anxious or depressed appearing.    Impression & Recommendations:  Problem # 1:  ACUTE BRONCHITIS (ICD-466.0) Assessment Comment Only  His updated medication list for this problem includes:    Penicillin V Potassium 500 Mg Tabs (Penicillin v potassium) .Marland Kitchen... Take 1 tablet by mouth three times a day    Tessalon Perles 100 Mg Caps (Benzonatate) .Marland Kitchen... Take 1 capsule by mouth three times a day  Orders: Medicare Electronic Prescription [id number]) Depo- Medrol 80mg  (J1040) Rocephin  250mg  (B1478) Admin of Therapeutic Inj  intramuscular or subcutaneous (29562)  Problem # 2:  ACUTE SINUSITIS, UNSPECIFIED (ICD-461.9) Assessment: Comment Only  His updated medication list for this problem  includes:    Penicillin V Potassium 500 Mg Tabs (Penicillin v potassium) .Marland Kitchen... Take 1 tablet by mouth three times a day    Tessalon Perles 100 Mg Caps (Benzonatate) .Marland Kitchen... Take 1 capsule by mouth three times a day  Orders: Medicare Electronic Prescription [phone])  Problem # 3:  ESSENTIAL HYPERTENSION (ICD-401.9) Assessment: Unchanged  His updated medication list for this problem includes:    Lisinopril 40 Mg Tabs (Lisinopril)  .Marland Kitchen... Take 1 tablet by mouth once a day  BP today: 100/70 Prior BP: 122/80 (08/27/2010)  Labs Reviewed: K+: 4.4 (08/09/2010) Creat: : 1.37 (08/09/2010)   Chol: 211 (07/30/2010)   HDL: 48 (07/30/2010)   LDL: 127 (07/30/2010)   TG: 181 (07/30/2010)  Complete Medication List: 1)  Simvastatin 40 Mg Tabs (Simvastatin) .... Take 1 tab by mouth at bedtime 2)  Lisinopril 40 Mg Tabs (Lisinopril) .... Take 1 tablet by mouth once a day 3)  Aspirin 81 Mg Tbec (Aspirin) .... Take 1 tablet by mouth once a day 4)  Multivitamins Tabs (Multiple vitamin) .... Take 1 tablet by mouth once a day 5)  Penicillin V Potassium 500 Mg Tabs (Penicillin v potassium) .... Take 1 tablet by mouth three times a day 6)  Prednisone (pak) 5 Mg Tabs (Prednisone) .... Use as directed 7)  Hycodan Syrup  .... One teaspoon every 8 hours as needed for excessive cough 8)  Tessalon Perles 100 Mg Caps (Benzonatate) .... Take 1 capsule by mouth three times a day  Patient Instructions: 1)  F/U as before 2)  You will get inhections in the office today. 3)  Meds are sent in for sinsitis and bronchitis. 4)  PLS get alot of rest, drink alot of fluids, rest your voice...do not talk, and pLS take the entire course of med Prescriptions: TESSALON PERLES 100 MG CAPS (BENZONATATE) Take 1 capsule by mouth three times a day  #30 x 0   Entered and Authorized by:   Syliva Overman MD   Signed by:   Syliva Overman MD on 10/18/2010   Method used:   Electronically to        [facility]  [address] [facility] (retail)       [address]       [place], [place]  [address]       Ph: [phone]       Fax: [phone]   RxID:   [id number] HYCODAN SYRUP one teaspoon every 8 hours as needed for excessive cough  #120 cc x 0   Entered and Authorized by:   Syliva Overman MD   Signed by:   Syliva Overman MD on 10/18/2010   Method used:   Printed then faxed to ...       [facility] [address] [facility] (retail)       [address]        [place], [place]  [address]       Ph: [phone]       Fax: [phone]   RxID:   [id number] PREDNISONE (PAK) 5 MG TABS (PREDNISONE) Use as directed  #21 x 0   Entered and Authorized by:   Syliva Overman MD   Signed by:   Syliva Overman MD on 10/18/2010   Method used:   Electronically to        [facility] [address] [facility] ([facility]ail)       [address]  [place], [place]  [address]       Ph: [phone]       Fax: [phone]   RxID:   [id number] PENICILLIN V POTASSIUM 500 MG TABS (PENICILLIN V POTASSIUM) Take 1 tablet by mouth three times a day  #30 x 0   Entered and Authorized by:   Syliva Overman MD   Signed by:   Syliva Overman MD on 10/18/2010   Method used:   Electronically to        [facility] [address] [address] [facility] ([facility]ail)       [address]       [place], [place]  [address]       Ph: [phone]       Fax: [phone]   RxID:   [id number]    Medication Administration  Injection # 1:    Medication: Depo- Medrol 80mg     Diagnosis: ACUTE BRONCHITIS (ICD-466.0)    Route: IM    Site: RUOQ gluteus    Exp Date: 07/12    Lot #: [id number]    Mfr: Pharmacia    Patient tolerated injection without complications    Given by: Adella Hare LPN (October 18, 2010 1:35 PM)  Injection # 2:    Medication: Rocephin  250mg     Diagnosis: ACUTE BRONCHITIS (ICD-466.0)    Route: IM    Site: LUOQ gluteus    Exp Date: 05/14    Lot #: [id number]    Mfr: [email or web address]    Comments: rocephin 500mg  given    Patient tolerated injection without complications    Given by: Adella Hare LPN (October 18, 2010 1:36 PM)  Orders Added: 1)  Est. Patient Level IV [62831] 2)  Medicare Electronic Prescription [G8553] 3)  Depo- Medrol 80mg  [J1040] 4)  Rocephin  250mg  [J0696] 5)  Admin of Therapeutic Inj  intramuscular or subcutaneous [96372]     Medication Administration  Injection # 1:    Medication: Depo- Medrol 80mg     Diagnosis: ACUTE  BRONCHITIS (ICD-466.0)    Route: IM    Site: RUOQ gluteus    Exp Date: 07/12    Lot #: [id number]    Mfr: Pharmacia    Patient tolerated injection without complications    Given by: Adella Hare LPN (October 18, 2010 1:35 PM)  Injection # 2:    Medication: Rocephin  250mg     Diagnosis: ACUTE BRONCHITIS (ICD-466.0)    Route: IM    Site: LUOQ gluteus    Exp Date: 05/14    Lot #: [id number]    Mfr: [email or web address]    Comments: rocephin 500mg  given    Patient tolerated injection without complications    Given by: Adella Hare LPN (October 18, 2010 1:36 PM)  Orders Added: 1)  Est. Patient Level IV [07371] 2)  Medicare Electronic Prescription [G8553] 3)  Depo- Medrol 80mg  [J1040] 4)  Rocephin  250mg  [J0696] 5)  Admin of Therapeutic Inj  intramuscular or subcutaneous [06269]

## 2010-11-21 NOTE — Letter (Signed)
Summary: Medication Reconciliation Letter  [facility]  [address]   [place], [place] [address]   Phone: [phone]  Fax: [phone]    10/18/2010 MRN: [id number]  [facility] [address] [place], [place]  [address]  Dear [name] [name],  We are glad to see you again today!  Please take a moment to review and update your medication list and Wellness questions then give this paper to the nurse when she calls you back for your appointment.Please include the over the counter medicines that you are currently taking.  Medication List:  SIMVASTATIN 40 MG TABS (SIMVASTATIN) Take 1 tab by mouth at bedtime LISINOPRIL 40 MG TABS (LISINOPRIL) Take 1 tablet by mouth once a day ASPIRIN 81 MG TBEC (ASPIRIN) Take 1 tablet by mouth once a day MULTIVITAMINS  TABS (MULTIPLE VITAMIN) Take 1 tablet by mouth once a day      Medication allergies:______________________________________________  Name of your current Pharmacy and location:_____________________________  ____________________________________________  Last Colonoscopy: Date:_____________ Where:____________________________  Last Tetanus Shot:__________ Last Flu Shot:__________   Last Pneumonia Shot:________  If you are Diabetic when and where was your last eye exam?______________  __________________________________________________________  Daytime Phone number:____________________________  Do you currently smoke or use tobacco products: Yes _____   No _____ If "Yes", how many packs per day: ______________  Has it been more than one year since your last visit to a physician? Yes______  No_______  Have you been seen in the Emergency Department since your last visit to the office? Yes______  No_______ If yes, please provide the name, location, and date seen:__________________________________________  Have you been admitted to the hospital since your last visit to the office? Yes______  No_______ If  yes, please provide the name, location, and date admitted:__________________________________________  Have you been seen by another physician since your last visit to the office? Yes______  No_______ If yes, please provide the name, location, and date seen:__________________________________________  Have you made any changes to your medications since your last visit to the office? Yes______  No_______ If yes, please mark changes on the list above.  When you are finished reviewing your medications, please sign one of the following statements.   I have reviewed the list of medicines above and they are correct,  Signature: ___________________________________________                                                  OR  I have reviewed the list of medicines and corrected any errors,  Signature: ___________________________________________     Thank you

## 2010-12-13 ENCOUNTER — Ambulatory Visit (INDEPENDENT_AMBULATORY_CARE_PROVIDER_SITE_OTHER): Payer: PRIVATE HEALTH INSURANCE | Admitting: Family Medicine

## 2010-12-13 ENCOUNTER — Encounter: Payer: Self-pay | Admitting: Family Medicine

## 2010-12-13 DIAGNOSIS — I10 Essential (primary) hypertension: Secondary | ICD-10-CM

## 2010-12-13 DIAGNOSIS — R5381 Other malaise: Secondary | ICD-10-CM

## 2010-12-13 DIAGNOSIS — E669 Obesity, unspecified: Secondary | ICD-10-CM

## 2010-12-13 DIAGNOSIS — J309 Allergic rhinitis, unspecified: Secondary | ICD-10-CM

## 2010-12-15 DIAGNOSIS — E66811 Obesity, class 1: Secondary | ICD-10-CM | POA: Insufficient documentation

## 2010-12-15 DIAGNOSIS — E669 Obesity, unspecified: Secondary | ICD-10-CM | POA: Insufficient documentation

## 2010-12-15 DIAGNOSIS — J309 Allergic rhinitis, unspecified: Secondary | ICD-10-CM | POA: Insufficient documentation

## 2010-12-26 NOTE — Assessment & Plan Note (Signed)
Summary: F UP   Vital Signs:  Patient profile:   70 year old male Height:      69 inches Weight:      202 pounds BMI:     29.94 O2 Sat:      98 % Pulse rate:   68 / minute Pulse rhythm:   regular Resp:     16 per minute BP sitting:   118 / 82  (left arm) Cuff size:   large  Vitals Entered By: Everitt Amber LPN (December 13, 2010 8:37 AM)  Nutrition Counseling: Patient's BMI is greater than 25 and therefore counseled on weight management options. CC: Follow up chronic problems   Primary Care Provider:  Syliva Overman MD  CC:  Follow up chronic problems.  History of Present Illness: Reports  that  hwe ahs been doing well. He still has niot commited to a regular exercise program, but is willing to consider it. Denies recent fever or chills. Denies sinus pressure, nasal congestion , ear pain or sore throat. Denies chest congestion, or cough productive of sputum. Denies chest pain, palpitations, PND, orthopnea or leg swelling. Denies abdominal pain, nausea, vomitting, diarrhea or constipation. Denies change in bowel movements or bloody stool. Denies dysuria , frequency, incontinence or hesitancy. Denies  joint pain, swelling, or reduced mobility. Denies headaches, vertigo, seizures. Denies depression, anxiety or insomnia. Denies  rash, lesions, or itch.     Current Medications (verified): 1)  Simvastatin 40 Mg Tabs (Simvastatin) .... Take 1 Tab By Mouth At Bedtime 2)  Lisinopril 40 Mg Tabs (Lisinopril) .... Take 1 Tablet By Mouth Once A Day 3)  Aspirin 81 Mg Tbec (Aspirin) .... Take 1 Tablet By Mouth Once A Day 4)  Multivitamins  Tabs (Multiple Vitamin) .... Take 1 Tablet By Mouth Once A Day  Allergies (verified): No Known Drug Allergies  Review of Systems      See HPI Eyes:  Denies discharge and eye pain. Endo:  Denies cold intolerance, excessive hunger, excessive thirst, and excessive urination. Heme:  Denies abnormal bruising, bleeding, enlarge lymph nodes, and  fevers. Allergy:  Complains of seasonal allergies; increased symptoms x 1 month, nasal congestion and clear drainage.  Physical Exam  General:  Well-developed,well-nourished,in no acute distress; alert,appropriate and cooperative throughout examination HEENT: No facial asymmetry,  EOMI, No sinus tenderness, TM's Clear, oropharynx  pink and moist.   Chest: Clear to auscultation bilaterally.  CVS: S1, S2, No murmurs, No S3.   Abd: Soft, Nontender.  MS: Adequate ROM spine, hips, shoulders and knees.  Ext: No edema.   CNS: CN 2-12 intact, power tone and sensation normal throughout.   Skin: Intact, no visible lesions or rashes.  Psych: Good eye contact, normal affect.  Memory intact, not anxious or depressed appearing.    Impression & Recommendations:  Problem # 1:  OVERWEIGHT (ICD-278.02) Assessment Comment Only  Ht: 69 (12/13/2010)   Wt: 202 (12/13/2010)   BMI: 29.94 (12/13/2010) ,therapeutic lifestyle change discussed and encouraged  Problem # 2:  ESSENTIAL HYPERTENSION (ICD-401.9) Assessment: Unchanged  His updated medication list for this problem includes:    Lisinopril 40 Mg Tabs (Lisinopril) .Marland Kitchen... Take 1 tablet by mouth once a day  Orders: T-Basic Metabolic Panel [phone]) T-Basic Metabolic Panel [phone])  BP today: 118/82 Prior BP: 100/70 (10/18/2010)  Labs Reviewed: K+: 4.4 (08/09/2010) Creat: : 1.37 (08/09/2010)   Chol: 211 (07/30/2010)   HDL: 48 (07/30/2010)   LDL: 127 (07/30/2010)   TG: 181 (07/30/2010)  Problem # 3:  HYPERLIPIDEMIA (ICD-272.4) Assessment: Comment Only  His updated medication list for this problem includes:    Simvastatin 40 Mg Tabs (Simvastatin) .Marland Kitchen... Take 1 tab by mouth at bedtime  Orders: T-Lipid Profile [id number]) T-Hepatic Function [phone]) T-Hepatic Function [id number]) T-Lipid Profile [phone]) Low fat dietdiscussed and encouraged  Labs Reviewed: SGOT: 18 (08/09/2010)   SGPT: 17 (08/09/2010)   HDL:48  (07/30/2010)  LDL:127 (07/30/2010)  Chol:211 (07/30/2010)  Trig:181 (07/30/2010)  Problem # 4:  ALLERGIC RHINITIS CAUSE UNSPECIFIED (ICD-477.9) Assessment: Deteriorated  His updated medication list for this problem includes:    Fluticasone Propionate 50 Mcg/act Susp (Fluticasone propionate) ..... One to two puffs per nostril once daily  Orders: Medicare Electronic Prescription [phone])  Complete Medication List: 1)  Simvastatin 40 Mg Tabs (Simvastatin) .... Take 1 tab by mouth at bedtime 2)  Lisinopril 40 Mg Tabs (Lisinopril) .... Take 1 tablet by mouth once a day 3)  Aspirin 81 Mg Tbec (Aspirin) .... Take 1 tablet by mouth once a day 4)  Multivitamins Tabs (Multiple vitamin) .... Take 1 tablet by mouth once a day 5)  Fluticasone Propionate 50 Mcg/act Susp (Fluticasone propionate) .... One to two puffs per nostril once daily  Other Orders: T-CBC w/Diff ([id number]) T-TSH [phone]) T-PSA [phone])  Patient Instructions: 1)  Please schedule a follow-up appointment October 25 or after. 2)  It is important that you exercise regularly at least 30 minutes 5 times a week. If you develop chest pain, have severe difficulty breathing, or feel very tired , stop exercising immediately and seek medical attention. 3)  You need to lose weight. Consider a lower calorie diet and regular exercise.  4)  BMP prior to visit, ICD-9: 5)  Hepatic Panel prior to visit, ICD-9:  fasting labs end April  6)  Lipid Panel prior to visit, ICD-9: 7)  BMP prior to visit, ICD-9: 8)  Hepatic Panel prior to visit, ICD-9: 9)  Lipid Panel prior to visit, ICD-9: 10)  TSH prior to visit, ICD-9:   afsting in October after the 20th 11)  CBC w/ Diff prior to visit, ICD-9: 12)  PSA prior to visit, ICD-9: 13)  No med chabnes. 14)  Colonscopy due May 2012 Prescriptions: FLUTICASONE PROPIONATE 50 MCG/ACT SUSP (FLUTICASONE PROPIONATE) one to two puffs per nostril once daily  #1 x 3   Entered and Authorized by:    Syliva Overman MD   Signed by:   Syliva Overman MD on 12/13/2010   Method used:   Electronically to        [facility] [address] [facility] (retail)       [address]       [place], [place]  [address]       Ph: [phone]       Fax: [phone]   RxID:   [id number]    Orders Added: 1)  Est. Patient Level IV [37628] 2)  T-Basic Metabolic Panel [[id number]] 3)  T-Lipid Profile [37106-26948] 4)  T-Hepatic Function [54627-03500] 5)  T-Basic Metabolic Panel [93818-29937] 6)  T-Hepatic Function [16967-89381] 7)  T-Lipid Profile [80061-22930] 8)  T-CBC w/Diff [01751-02585] 9)  T-TSH [27782-42353] 10)  T-PSA [61443-15400] 11)  Medicare Electronic Prescription [Q6761]

## 2011-02-17 ENCOUNTER — Other Ambulatory Visit: Payer: Self-pay | Admitting: Family Medicine

## 2011-02-17 LAB — BASIC METABOLIC PANEL
BUN: 20 mg/dL (ref 6–23)
CO2: 26 mEq/L (ref 19–32)
Calcium: 9.8 mg/dL (ref 8.4–10.5)
Chloride: 106 mEq/L (ref 96–112)
Creat: 1.43 mg/dL (ref 0.40–1.50)
Glucose, Bld: 84 mg/dL (ref 70–99)
Potassium: 4.7 mEq/L (ref 3.5–5.3)
Sodium: 143 mEq/L (ref 135–145)

## 2011-02-17 LAB — HEPATIC FUNCTION PANEL
ALT: 19 U/L (ref 0–53)
AST: 21 U/L (ref 0–37)
Albumin: 4.5 g/dL (ref 3.5–5.2)
Alkaline Phosphatase: 40 U/L (ref 39–117)
Bilirubin, Direct: 0.1 mg/dL (ref 0.0–0.3)
Indirect Bilirubin: 0.4 mg/dL (ref 0.0–0.9)
Total Bilirubin: 0.5 mg/dL (ref 0.3–1.2)
Total Protein: 6.8 g/dL (ref 6.0–8.3)

## 2011-02-17 LAB — LIPID PANEL
Cholesterol: 195 mg/dL (ref 0–200)
HDL: 48 mg/dL (ref >39)
LDL Cholesterol: 114 mg/dL — ABNORMAL HIGH (ref 0–99)
Total CHOL/HDL Ratio: 4.1 Ratio
Triglycerides: 163 mg/dL — ABNORMAL HIGH (ref <150)
VLDL: 33 mg/dL (ref 0–40)

## 2011-02-26 ENCOUNTER — Telehealth: Payer: Self-pay | Admitting: Family Medicine

## 2011-02-26 NOTE — Telephone Encounter (Signed)
Advised wife patient needs to go to urgent care

## 2011-03-04 NOTE — Op Note (Signed)
[name] [name], [name] [name]              ACCOUNT NO.:  [id number]   MEDICAL RECORD NO.:  [id number]          PATIENT TYPE:  AMB   LOCATION:  DAY                           FACILITY:  [facility]   PHYSICIAN:  Kassie Mends, M.D.      DATE OF BIRTH:  [birth year]   DATE OF PROCEDURE:  02/24/2007  DATE OF DISCHARGE:                                PROCEDURE NOTE   REFERRING PHYSICIAN:  Karleen Hampshire, M.D.   PROCEDURE:  Colonoscopy.   ENDOSCOPIST:  Kassie Mends, M.D.   INDICATION FOR EXAM:  [name] [name] is a 70 year old male who has a  history of a normocytic anemia and a low-normal ferritin level.  He also  has heme-positive stools.  He had an EGD as well as a capsule endoscopy  which revealed no source for his anemia or heme-positive stool.  His  last colonoscopy was in 2004.  He presents for evaluation for anemia and  heme-positive stool.   FINDINGS:  1. Moderate internal hemorrhoids, likely source of heme-positive      stools.  No source for his anemia identified.  2. [name] [name] had a 3-mm polyp in the sigmoid colon which disappeared      with full insufflation, suggesting that it was hyperplastic.  The      polyp could not be retrieved.  Otherwise, no masses, inflammatory      changes, diverticula or AVMs seen.   RECOMMENDATIONS:  1. Screening colonoscopy in 5 years.  2. [name] [name] is given information on hemorrhoids as well as a high-      fiber diet.  3. [name] [name] states that oral iron causes him to feel extremely      nauseated and I asked him to follow up with his primary physician      for an alternative means of iron supplementation.   MEDICATIONS:  1. Demerol 75 mg IV.  2. Versed 3 mg IV.   PROCEDURE TECHNIQUE:  Physical exam was performed and informed consent  was obtained from the patient after explaining the benefits, risks and  alternatives to the procedure.  The patient was connected to the monitor  and placed in the left lateral position.  Continuous oxygen was  provided  by nasal cannula and IV medicine administered through an indwelling  cannula.  After administration of sedation and rectal exam, the  patient's rectum was intubated and scope was advanced under direct  visualization to the cecum.  The ileocecal valve was visualized and was  normal.  The scope was subsequently removed slowly by carefully  examining the color, texture, anatomy and integrity of the mucosa on the  way out.  The patient was recovered in Endoscopy and discharged home in  satisfactory condition.      Kassie Mends, M.D.  Electronically Signed     SM/MEDQ  D:  02/24/2007  T:  02/24/2007  Job:  [id number]   cc:   [name], M.D.  Fax: [phone]

## 2011-03-07 NOTE — Op Note (Signed)
NAMEEDWEN, [name] [name]              ACCOUNT NO.:  [id number]   MEDICAL RECORD NO.:  [id number]          PATIENT TYPE:  AMB   LOCATION:  DAY                           FACILITY:  [facility]   PHYSICIAN:  Kassie Mends, M.D.      DATE OF BIRTH:  01/25/1941   DATE OF PROCEDURE:  01/20/2007  DATE OF DISCHARGE:  01/20/2007                                PROCEDURE NOTE   REQUESTING PHYSICIAN:  Kassie Mends, M.D.   PROCEDURE:  Small bowel Given capsule endoscopy.   INDICATION FOR PROCEDURE:  [name] [name] is a 70 year old male who had a  colonoscopy in 2004 for screening, which was benign.  He has history of  normocytic anemia and a ferritin level that is low limits of normal.  He  underwent EGD by Dr. Cira Servant on January 05, 2007; he was found to have a  small hiatal hernia and otherwise normal exam.  He is undergoing Given  capsule study to further evaluate his anemia and Hemoccult-positive  stool.   FINDINGS:  Gastric passage time was less than 1 minute.  The first  duodenal image was at 1 minute 25 seconds.  At 1 hour 1 minute and 17  seconds through 1 hour 1 minute and 21 seconds, there is presence of a  yellowish discoloration and submucosal lesion suspicious for  lymphangiectasis.  Similar findings are present at 1 hour 1 minute and  29 seconds through 1 hour 2 minutes and 59 seconds.  At 1 hour 42  minutes and 59 seconds, there is interference of the video footage which  lasts through 1 hours 43 minutes and 29 seconds.  The first ileocecal  valve image is at 3 hours 10 minutes and 16 seconds and first cecal  image is at 3 hours 35 minutes.  Total small bowel passage time is 3  hours 34 minutes.  There is no evidence of GI bleeding, stricture, edema  or erosions on the study.   SUMMARY AND RECOMMENDATIONS:  Proximal small bowel moderate-sized  lymphangiectasis seen.  No evidence of active bleeding, stricture,  erosions or bleeding.  These findings do not explain his iron deficiency  anemia.   Several images were reviewed with Dr. Jena Gauss in Dr. Ulyses Southward  absence.  At this point, [name] [name] is due for followup in the office  in a couple of weeks with Dr. Cira Servant and she can discuss pursuing a  repeat colonoscopy, given the fact that it has been almost 4 years and  he has persistent iron deficiency anemia as well as weight loss.      Nicholas Lose, N.P.      Kassie Mends, M.D.  Electronically Signed    KC/MEDQ  D:  01/22/2007  T:  01/22/2007  Job:  [id number]   cc:   [name], M.D.  Fax: [phone]

## 2011-03-07 NOTE — Consult Note (Signed)
NAMECHINMAY, [name]              ACCOUNT NO.:  [id number]   MEDICAL RECORD NO.:  [id number]          PATIENT TYPE:  AMB   LOCATION:                                FACILITY:  [facility]   PHYSICIAN:  [name] [name], M.D.      DATE OF BIRTH:  [birth year]   DATE OF CONSULTATION:  12/28/2006  DATE OF DISCHARGE:                                 CONSULTATION   REFERRING PHYSICIAN:  Kirk [name], M.D.   REASON FOR CONSULTATION:  Nausea, weight loss and anemia.   HISTORY OF PRESENT ILLNESS:  [name] [name] is a 70 year old male who had a  colonoscopy in 2004 for screening, which showed no polyps.  He started  taking iron pills in December2007, which caused nausea and vomiting and  resulted in weight loss.  He had regular bowel movements two to three  times a day until he started taking iron pills and now he has  constipation.  His iron pill was changed and now he no longer has nausea  or vomiting and has gained 6 pounds since his last visit with [name].  He has labs that were drawn in the beginning of  February2008, which revealed a BUN of 18, a creatinine of 1.44, a  ferritin of 22 (22-322, percent saturation of 11 (20-55), iron 39 (42-  165), hemoglobin 12.6 (13-17). MCV 87 (78-100).  He denies any black,  tarry stools, blood in his stool, nausea, vomiting or problems  swallowing.  He denies any abdominal pain, heartburn or indigestion.  He  does burp more.   PAST MEDICAL HISTORY:  1. Hypertension.  2. Hyperlipidemia.   PAST SURGICAL HISTORY:  None.   ALLERGIES:  No known drug allergies.   MEDICATIONS:  1. Simvastatin 20 mg daily.  2. Lisinopril 20/hydrochlorothiazide 12.5 daily.  3. Iron 325 mg daily.   FAMILY HISTORY:  He has no family history of colon cancer, colon polyps,  breast, ovarian, uterine cancer.   SOCIAL HISTORY:  He is married and he has one child, age 41.  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  He denies any tobacco or alcohol use.   REVIEW OF SYSTEMS:  On February22 he weighed 193 pounds.  His review of  systems is per the HPI, otherwise all systems are negative.  He has a  note here that states he had Hemoccult testing and it was positive.  His  H. pylori serologies were tested and they were negative.  His review of  systems is per the HPI, otherwise all other systems are negative.   PHYSICAL EXAMINATION:  VITAL SIGNS:  Weight 199 pounds, height 5 feet 10  inches, BMI 28.6 (overweight), temperature 97.8, blood pressure 108/78,  pulse 84.GENERAL:  He is no apparent distress, alert and oriented x4.  HEENT:  Atraumatic, normocephalic.  Pupils equal and react to light.  Mouth:  No oral lesions.  Posterior pharynx without erythema or exudate.  NECK:  Full range of motion.  No lymphadenopathy.  LUNGS:  Clear to  auscultation bilaterally.  CARDIOVASCULAR:  Regular rhythm, no murmur,  normal S1 and S2.  ABDOMEN:  Bowel sounds present, soft, nontender,  nondistended.  No rebound or guarding.  I am unable to appreciate  hepatosplenomegaly, abdominal bruits or pulsatile masses.  EXTREMITIES:  Without cyanosis, clubbing or edema.  NEUROLOGIC:  He has no focal  neurologic deficits.   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   ASSESSMENT:  [name] [name] is a 70 year old male with nausea which is  resolved now that is he is on a different iron preparation.  However, he  has a normocytic anemia and a ferritin level that is the lower limit of  normal, and his colonoscopy is up-to-date.  His Hemoccult testing has a  low positive predictive value when performed within 5 years of his  colonoscopy.  He has never had an upper endoscopy performed and could  have an arteriovenous malformation.  However, the etiology for his  normocytic anemia and his inappropriate bone marrow response is unclear  and is most  likely reflects early iron-deficiency anemia.  Thank you for  allowing me to see [name] [name] in consultation.  My recommendations  follow.   RECOMMENDATIONS:  1. [name] [name] will be scheduled for an upper endoscopy on March 17 or      March 18.  If his upper endoscopy is negative, then he will be      scheduled for a capsule endoscopy.  If the upper endoscopy and      capsule endoscopy do not reveal the etiology for his normocytic      anemia, then I will review the colonoscopy report from [name] [name]      from 2004 and consider repeating his colonoscopy.  2. He should continue his iron supplementation.  3. He will have a follow-up appointment to see me after the endoscopy      is complete.      [name] [name], M.D.  Electronically Signed     SM/MEDQ  D:  12/28/2006  T:  12/29/2006  Job:  [id number]   cc:   [name] [name], M.D.  Fax: [phone]

## 2011-03-07 NOTE — H&P (Signed)
   NAME:  [name] [name], [name] [name]                        ACCOUNT NO.:  [id number]   MEDICAL RECORD NO.:  [id number]                   PATIENT TYPE:  AMB   LOCATION:  DAY                                  FACILITY:  [facility]   PHYSICIAN:  Jerolyn Shin C. Katrinka Blazing, M.D.                DATE OF BIRTH:  Jan 04, 1941   DATE OF ADMISSION:  DATE OF DISCHARGE:                                HISTORY & PHYSICAL   HISTORY OF PRESENT ILLNESS:  [age]-year-old male referred by Dr. Patrica Duel for screening colonoscopy.  He has no GI symptoms.  He has had  normal bowel movements.  He has had no difficulty with meals.  He denies  abdominal pain.  There is a negative family history of colon cancer or other  GI problems.  The patient is scheduled for screening colonoscopy.   PAST HISTORY:  He has hypertension and hyperlipidemia.   MEDICATIONS:  1. Diovan HCT 160/12.5 mg daily.  2. Zocor 20 mg daily.   SURGERY:  None.   ALLERGIES:  None.   PHYSICAL EXAMINATION:  VITAL SIGNS:  Blood pressure 100/70, pulse 78,  respirations 20, weight 183 pounds.  HEENT:  Unremarkable.  NECK:  Neck is supple without JVD or bruit.  CHEST:  Chest clear to auscultation.  HEART:  Regular rate and rhythm without murmur, gallop or rub.  ABDOMEN:  Abdomen soft, nontender and no masses.  RECTAL:  Normal.  Stool guaiac negative.  EXTREMITIES:  No cyanosis, clubbing or edema.  NEUROLOGIC:  No focal motor, sensory or cerebellar deficit.   IMPRESSION:  1. Need for screening colonoscopy.  2. Hypertension.  3. Hyperlipidemia.   PLAN:  Screening colonoscopy.                                               Dirk Dress. Katrinka Blazing, M.D.   LCS/MEDQ  D:  03/21/2003  T:  03/22/2003  Job:  [id number]

## 2011-03-07 NOTE — Op Note (Signed)
NAMEPAYDEN, DOCTER              ACCOUNT NO.:  [id number]   MEDICAL RECORD NO.:  [id number]          PATIENT TYPE:  AMB   LOCATION:  DAY                           FACILITY:  [facility]   PHYSICIAN:  Kassie Mends, M.D.      DATE OF BIRTH:  11-16-1940   DATE OF PROCEDURE:  01/05/2007  DATE OF DISCHARGE:                               OPERATIVE REPORT   REFERRING PHYSICIAN:  [name], M.D.   PROCEDURE:  Esophagogastroduodenoscopy with cold forceps biopsy.   INDICATION FOR EXAM:  Mr. Dunlap is a 70 year old male with normocytic  anemia, and heme-positive stool stools.  His last colonoscopy was in  2004 and was normal.  He has had no evidence of active overt GI  bleeding.  He has been maintained on iron since December 2007.  His  ferritin was measured at 22 which is the lower limit of normal.   FINDINGS:  1. Small hiatal hernia with no evidence of Cameron ulcers.  The second      portion of the duodenum was biopsied to evaluate for celiac sprue.      Otherwise he had a normal esophagus without evidence of Barrett's.      Normal stomach without evidence of antral erosions or ulcerations.      Normal duodenal bulb and second portion of the duodenum.   RECOMMENDATIONS:  1. He may resume his previous diet.  He is asked to avoid aspirin,      NSAIDs or anticoagulation for three days.  2. He should continue his iron.  3. He is scheduled for capsule endoscopy in 2 weeks to evaluate his      heme-positive stools and anemia.  He is asked to begin clear      liquids at 6:00 p.m. on the day prior to his capsule endoscopy.  4. Will call Mr. Holmer with biopsy results.  5. He is scheduled to return to see me in six weeks.   MEDICATIONS:  1. Demerol 75 mg IV.  2. Versed 6 mg IV.   PROCEDURE TECHNIQUE:  Physical exam was performed and informed consent  was obtained from the patient after explaining the benefits, risks and  alternatives to the procedure.  The patient was connected to the  monitor  and placed in the left lateral position.  Continuous oxygen was provided  by nasal cannula and IV medicine administered through an indwelling  cannula.  After administration of sedation and rectal exam, the  patient's esophagus intubated  and the scope was advanced under direct visualization to second portion  of duodenum.  The scope was subsequently  removed slowly by carefully  examining the color, texture, anatomy integrity mucosa on the way out.  The patient was recovered in endoscopy suite and discharged home in  satisfactory condition.      Kassie Mends, M.D.  Electronically Signed     SM/MEDQ  D:  01/06/2007  T:  01/07/2007  Job:  [id number]   cc:   [name], M.D.  Fax: [phone]

## 2011-06-11 ENCOUNTER — Other Ambulatory Visit: Payer: Self-pay | Admitting: Family Medicine

## 2011-06-30 ENCOUNTER — Other Ambulatory Visit: Payer: Self-pay | Admitting: Family Medicine

## 2011-07-23 LAB — URINALYSIS, ROUTINE W REFLEX MICROSCOPIC
Bilirubin Urine: NEGATIVE
Glucose, UA: NEGATIVE
Hgb urine dipstick: NEGATIVE
Ketones, ur: NEGATIVE
Nitrite: NEGATIVE
Protein, ur: NEGATIVE
Specific Gravity, Urine: 1.025
Urobilinogen, UA: 0.2
pH: 5.5

## 2011-07-23 LAB — COMPREHENSIVE METABOLIC PANEL
ALT: 21
AST: 24
Albumin: 4.1
Alkaline Phosphatase: 52
BUN: 26 — ABNORMAL HIGH
CO2: 28
Calcium: 9.1
Chloride: 104
Creatinine, Ser: 1.67 — ABNORMAL HIGH
GFR calc Af Amer: 50 — ABNORMAL LOW
GFR calc non Af Amer: 41 — ABNORMAL LOW
Glucose, Bld: 103 — ABNORMAL HIGH
Potassium: 3.9
Sodium: 135
Total Bilirubin: 0.8
Total Protein: 6.9

## 2011-07-23 LAB — LIPASE, BLOOD: Lipase: 35

## 2011-07-23 LAB — DIFFERENTIAL
Basophils Absolute: 0
Basophils Relative: 0
Eosinophils Absolute: 0.1
Eosinophils Relative: 1
Lymphocytes Relative: 13
Lymphs Abs: 1.3
Monocytes Absolute: 1
Monocytes Relative: 10
Neutro Abs: 7.8 — ABNORMAL HIGH
Neutrophils Relative %: 76

## 2011-07-23 LAB — CBC
HCT: 37.1 — ABNORMAL LOW
Hemoglobin: 12.8 — ABNORMAL LOW
MCHC: 34.5
MCV: 87.7
Platelets: 192
RBC: 4.23
RDW: 13.6
WBC: 10.2

## 2011-07-23 LAB — AMYLASE: Amylase: 82

## 2011-08-04 ENCOUNTER — Other Ambulatory Visit: Payer: Self-pay | Admitting: Family Medicine

## 2011-08-04 LAB — HEPATIC FUNCTION PANEL
ALT: 18 U/L (ref 0–53)
AST: 20 U/L (ref 0–37)
Albumin: 4.5 g/dL (ref 3.5–5.2)
Alkaline Phosphatase: 41 U/L (ref 39–117)
Bilirubin, Direct: 0.1 mg/dL (ref 0.0–0.3)
Indirect Bilirubin: 0.2 mg/dL (ref 0.0–0.9)
Total Bilirubin: 0.3 mg/dL (ref 0.3–1.2)
Total Protein: 6.7 g/dL (ref 6.0–8.3)

## 2011-08-04 LAB — CBC WITH DIFFERENTIAL/PLATELET
Basophils Absolute: 0.1 10*3/uL (ref 0.0–0.1)
Basophils Relative: 1 % (ref 0–1)
Eosinophils Absolute: 0.3 10*3/uL (ref 0.0–0.7)
Eosinophils Relative: 5 % (ref 0–5)
HCT: 41.1 % (ref 39.0–52.0)
Hemoglobin: 13.1 g/dL (ref 13.0–17.0)
Lymphocytes Relative: 35 % (ref 12–46)
Lymphs Abs: 2.3 10*3/uL (ref 0.7–4.0)
MCH: 28.6 pg (ref 26.0–34.0)
MCHC: 31.9 g/dL (ref 30.0–36.0)
MCV: 89.7 fL (ref 78.0–100.0)
Monocytes Absolute: 0.7 10*3/uL (ref 0.1–1.0)
Monocytes Relative: 11 % (ref 3–12)
Neutro Abs: 3.1 10*3/uL (ref 1.7–7.7)
Neutrophils Relative %: 49 % (ref 43–77)
Platelets: 214 10*3/uL (ref 150–400)
RBC: 4.58 MIL/uL (ref 4.22–5.81)
RDW: 14.2 % (ref 11.5–15.5)
WBC: 6.4 10*3/uL (ref 4.0–10.5)

## 2011-08-04 LAB — LIPID PANEL
Cholesterol: 186 mg/dL (ref 0–200)
HDL: 46 mg/dL (ref >39)
LDL Cholesterol: 114 mg/dL — ABNORMAL HIGH (ref 0–99)
Total CHOL/HDL Ratio: 4 Ratio
Triglycerides: 129 mg/dL (ref <150)
VLDL: 26 mg/dL (ref 0–40)

## 2011-08-04 LAB — TSH: TSH: 2.417 u[IU]/mL (ref 0.350–4.500)

## 2011-08-04 LAB — BASIC METABOLIC PANEL
BUN: 15 mg/dL (ref 6–23)
CO2: 27 mEq/L (ref 19–32)
Calcium: 9.5 mg/dL (ref 8.4–10.5)
Chloride: 108 mEq/L (ref 96–112)
Creat: 1.4 mg/dL — ABNORMAL HIGH (ref 0.50–1.35)
Glucose, Bld: 87 mg/dL (ref 70–99)
Potassium: 4.7 mEq/L (ref 3.5–5.3)
Sodium: 144 mEq/L (ref 135–145)

## 2011-08-05 LAB — PSA: PSA: 0.57 ng/mL (ref <=4.00)

## 2011-08-07 ENCOUNTER — Encounter: Payer: Self-pay | Admitting: Family Medicine

## 2011-08-11 ENCOUNTER — Encounter: Payer: Self-pay | Admitting: Family Medicine

## 2011-08-11 ENCOUNTER — Ambulatory Visit (INDEPENDENT_AMBULATORY_CARE_PROVIDER_SITE_OTHER): Payer: PRIVATE HEALTH INSURANCE | Admitting: Family Medicine

## 2011-08-11 DIAGNOSIS — I10 Essential (primary) hypertension: Secondary | ICD-10-CM

## 2011-08-11 DIAGNOSIS — E785 Hyperlipidemia, unspecified: Secondary | ICD-10-CM

## 2011-08-11 DIAGNOSIS — Z23 Encounter for immunization: Secondary | ICD-10-CM

## 2011-08-11 DIAGNOSIS — J309 Allergic rhinitis, unspecified: Secondary | ICD-10-CM

## 2011-08-11 MED ORDER — INFLUENZA VAC TYPES A & B PF IM SUSP: 0.5000 mL | Freq: Once | INTRAMUSCULAR | Status: DC

## 2011-08-11 MED ORDER — LORATADINE 10 MG PO TABS: 10.0000 mg | ORAL_TABLET | Freq: Every day | ORAL | Status: DC

## 2011-08-11 NOTE — Patient Instructions (Signed)
CPE in 6 months.  Pls cut back on red meat, country ham, fried and fatty foods, your bad cholesterol which increases your risk of heart disease and stroke is still too high, and has not changed since April. Current is 114, should be under 100, you can do this   Fasting lipid, hepatic and chem 7 in 6 months.   Please call your ins to see if the shingles vaccine is covered , we have it and will give it to you.  Today, TdAP and flu vaccines

## 2011-08-11 NOTE — Assessment & Plan Note (Signed)
Hyperlipidemia:Low fat diet discussed and encouraged.  Uncontrolled due to dietary indiscretion

## 2011-08-11 NOTE — Assessment & Plan Note (Signed)
Controlled, no change in medication  

## 2011-08-18 NOTE — Progress Notes (Signed)
  Subjective:    Patient ID: [name] [name], male    DOB: Apr 23, 1941, 70 y.o.   MRN: [id number]  HPI The PT is here for follow up and re-evaluation of chronic medical conditions, medication management and review of any available recent lab and radiology data.  Preventive health is updated, specifically  Cancer screening and Immunization.   Questions or concerns regarding consultations or procedures which the PT has had in the interim are  addressed. The PT denies any adverse reactions to current medications since the last visit.  There are no new concerns.  C/o increased allergy symptoms with nasal congestion and increased clear post nasal drainage for the past month      Review of Systems See HPI Denies recent fever or chills. Denies sinus pressure, ear pain or sore throat. Denies chest congestion, productive cough or wheezing. Denies chest pains, palpitations and leg swelling Denies abdominal pain, nausea, vomiting,diarrhea or constipation.   Denies dysuria, frequency, hesitancy or incontinence. Denies joint pain, swelling and limitation in mobility. Denies headaches, seizures, numbness, or tingling. Denies depression, anxiety or insomnia. Denies skin break down or rash.        Objective:   Physical Exam Patient alert and oriented and in no cardiopulmonary distress.  HEENT: No facial asymmetry, EOMI, no sinus tenderness,  oropharynx pink and moist.  Neck supple no adenopathy.  Chest: Clear to auscultation bilaterally.  CVS: S1, S2 no murmurs, no S3.  ABD: Soft non tender. Bowel sounds normal.  Ext: No edema  MS: Adequate ROM spine, shoulders, hips and knees.  Skin: Intact, no ulcerations or rash noted.  Psych: Good eye contact, normal affect. Memory intact not anxious or depressed appearing.  CNS: CN 2-12 intact, power, tone and sensation normal throughout.        Assessment & Plan:

## 2011-08-18 NOTE — Assessment & Plan Note (Signed)
Uncontrolled with increased symptoms x 1 month, pt to start oral med

## 2011-09-08 ENCOUNTER — Telehealth: Payer: Self-pay | Admitting: Family Medicine

## 2011-09-08 MED ORDER — LISINOPRIL 40 MG PO TABS: ORAL_TABLET | ORAL | Status: DC

## 2011-09-08 NOTE — Telephone Encounter (Signed)
Sent in

## 2011-10-01 ENCOUNTER — Encounter: Payer: Self-pay | Admitting: Family Medicine

## 2011-10-02 ENCOUNTER — Encounter: Payer: Self-pay | Admitting: Family Medicine

## 2011-10-02 ENCOUNTER — Ambulatory Visit (INDEPENDENT_AMBULATORY_CARE_PROVIDER_SITE_OTHER): Payer: PRIVATE HEALTH INSURANCE | Admitting: Family Medicine

## 2011-10-02 VITALS — BP 122/72 | HR 92 | Resp 18 | Ht 69.0 in | Wt 209.1 lb

## 2011-10-02 DIAGNOSIS — J40 Bronchitis, not specified as acute or chronic: Secondary | ICD-10-CM

## 2011-10-02 DIAGNOSIS — H6690 Otitis media, unspecified, unspecified ear: Secondary | ICD-10-CM

## 2011-10-02 DIAGNOSIS — I10 Essential (primary) hypertension: Secondary | ICD-10-CM

## 2011-10-02 DIAGNOSIS — H6691 Otitis media, unspecified, right ear: Secondary | ICD-10-CM

## 2011-10-02 MED ORDER — SULFAMETHOXAZOLE-TRIMETHOPRIM 800-160 MG PO TABS: 1.0000 | ORAL_TABLET | Freq: Two times a day (BID) | ORAL | Status: AC

## 2011-10-02 MED ORDER — LISINOPRIL 40 MG PO TABS: ORAL_TABLET | ORAL | Status: DC

## 2011-10-02 MED ORDER — BENZONATATE 100 MG PO CAPS: 100.0000 mg | ORAL_CAPSULE | Freq: Four times a day (QID) | ORAL | Status: DC | PRN

## 2011-10-02 MED ORDER — CEFTRIAXONE SODIUM 500 MG IJ SOLR
500.0000 mg | Freq: Once | INTRAMUSCULAR | Status: AC
  Administered 2011-10-02: 500 mg via INTRAMUSCULAR

## 2011-10-02 MED ORDER — SIMVASTATIN 40 MG PO TABS: ORAL_TABLET | ORAL | Status: DC

## 2011-10-02 NOTE — Patient Instructions (Addendum)
F/u as before.  You are being treated for acute bronchitis and right ear infection. Rocephin 500mg  is administered in the office and a 10 day course of antibiotic and decongestant is sent to the pharmacy also  Acute Bronchitis Bronchitis is when the organs and tissues involved in breathing get puffy (swollen) and can leak fluid. This makes it harder for air to get in and out of the lungs. You may cough a lot and produce thick spit (mucus). Acute means the illness started suddenly. HOME CARE  Rest.   Drink enough fluids to keep the pee (urine) clear or pale yellow.   Medicines may be given that will open up your airways to help you breathe better. Only take medicine as told by your doctor.   Use a cool mist vaporizer. This will help to thin any thick spit.   Do not smoke. Avoid secondhand smoke.  GET HELP RIGHT AWAY IF:   You have a temperature by mouth above 102 F (38.9 C), not controlled by medicine.   You have chills.   You develop severe shortness of breath or chest pain.   You have bloody spit mixed with mucus (sputum).   You throw up (vomit) often.   You lose too much body fluid (dehydrated).   You have a severe headache.   You feel faint.   You do not improve after 1 week of treatment.  MAKE SURE YOU:   Understand these instructions.   Will watch your condition.   Will get help right away if you are not doing well or get worse.  Document Released: 03/24/2008 Document Revised: 06/18/2011 Document Reviewed: 10/24/2009 [facility] Patient Information 2012 [place], [place].

## 2011-10-02 NOTE — Progress Notes (Signed)
Subjective:     Patient ID: [name] [name], male   DOB: 21-Feb-1941, 70 y.o.   MRN: [id number]  HPI 10 day h/o chest congestion, hoarseness, and cough also right ear pain with ringing, intermittent chills no documented fever Review of Systems See HPI  Denies sinus pressure, nasal congestion,. Denies chest congestion, productive cough or wheezing. Denies chest pains, palpitations and leg swelling Denies abdominal pain, nausea, vomiting,diarrhea or constipation.   Denies dysuria, frequency, hesitancy or incontinence. Denies joint pain, swelling and limitation in mobility. Denies headaches, seizures, numbness, or tingling. Denies depression, anxiety or insomnia. Denies skin break down or rash.        Objective:   Physical Exam Patient alert and oriented and in no cardiopulmonary distress.  HEENT: No facial asymmetry, EOMI, no sinus tenderness,  oropharynx pink and moist.  Neck supple no adenopathy.Right tM erythematous and dull  Chest: decreased air entry bilaterally, scattered crackles CVS: S1, S2 no murmurs, no S3.  ABD: Soft non tender. Bowel sounds normal.  Ext: No edema  MS: Adequate ROM spine, shoulders, hips and knees.  Skin: Intact, no ulcerations or rash noted.  Psych: Good eye contact, normal affect. Memory intact not anxious or depressed appearing.  CNS: CN 2-12 intact, power, tone and sensation normal throughout.     Assessment:         Plan:

## 2011-10-02 NOTE — Progress Notes (Deleted)
Patient ID: [name] [name], male   DOB: 1941-05-29, 70 y.o.   MRN: [id number]

## 2011-10-02 NOTE — Assessment & Plan Note (Signed)
10 day h/o chest congestion , cough and right ear pai, rocephin in the office , septra and tessalon perles prescribed

## 2011-10-03 DIAGNOSIS — H6691 Otitis media, unspecified, right ear: Secondary | ICD-10-CM | POA: Insufficient documentation

## 2011-10-03 NOTE — Assessment & Plan Note (Signed)
Controlled, no change in medication  

## 2011-10-03 NOTE — Assessment & Plan Note (Signed)
10 day course of antibiotic prescribed 

## 2011-10-20 ENCOUNTER — Encounter: Payer: Self-pay | Admitting: Family Medicine

## 2011-10-24 ENCOUNTER — Encounter: Payer: Self-pay | Admitting: Family Medicine

## 2011-10-27 ENCOUNTER — Ambulatory Visit (INDEPENDENT_AMBULATORY_CARE_PROVIDER_SITE_OTHER): Payer: MEDICARE | Admitting: Family Medicine

## 2011-10-27 ENCOUNTER — Encounter: Payer: Self-pay | Admitting: Family Medicine

## 2011-10-27 VITALS — BP 112/68 | HR 69 | Resp 18 | Ht 69.0 in | Wt 215.1 lb

## 2011-10-27 DIAGNOSIS — E785 Hyperlipidemia, unspecified: Secondary | ICD-10-CM

## 2011-10-27 DIAGNOSIS — J40 Bronchitis, not specified as acute or chronic: Secondary | ICD-10-CM

## 2011-10-27 DIAGNOSIS — I10 Essential (primary) hypertension: Secondary | ICD-10-CM

## 2011-10-27 DIAGNOSIS — E663 Overweight: Secondary | ICD-10-CM

## 2011-10-27 DIAGNOSIS — J040 Acute laryngitis: Secondary | ICD-10-CM

## 2011-10-27 DIAGNOSIS — R930 Abnormal findings on diagnostic imaging of skull and head, not elsewhere classified: Secondary | ICD-10-CM

## 2011-10-27 MED ORDER — PREDNISONE (PAK) 5 MG PO TABS: 5.0000 mg | ORAL_TABLET | ORAL | Status: DC

## 2011-10-27 MED ORDER — HYDROCODONE-HOMATROPINE 5-1.5 MG/5ML PO SYRP: 5.0000 mL | ORAL_SOLUTION | Freq: Three times a day (TID) | ORAL | Status: AC | PRN

## 2011-10-27 NOTE — Patient Instructions (Addendum)
F/u as before.  You have laryngitis.  You need to rest the voice, speak very little.  Prednisone is prescribed for 6 days total.   Ok to gargle with salt water for some relief.  If you are still hoarse in 10 days, call I will refer  You to ENT.  You may be also having probs with uncontrolled reflux , so pls stop the chocolate and caffeine, and also work on weight loss

## 2011-10-29 NOTE — Assessment & Plan Note (Signed)
Hyperlipidemia:Low fat diet discussed and encouraged.  Uncontrolled, no med change dietary change only

## 2011-10-29 NOTE — Assessment & Plan Note (Signed)
resolved 

## 2011-10-29 NOTE — Assessment & Plan Note (Signed)
Deteriorated. Patient re-educated about  the importance of commitment to a  minimum of 150 minutes of exercise per week. The importance of healthy food choices with portion control discussed. Encouraged to start a food diary, count calories and to consider  joining a support group. Sample diet sheets offered. Goals set by the patient for the next several months.    

## 2011-10-29 NOTE — Assessment & Plan Note (Signed)
Controlled, no change in medication  

## 2011-10-29 NOTE — Progress Notes (Signed)
  Subjective:    Patient ID: [name] [name], male    DOB: [birth year], 71 y.o.   MRN: [id number]  HPI Pt reports 5 day h/o progressive painless hoarseness, no fever or choills, no sinus pressure or nasal drainage, cough has resolved. C/o increased GERD symptoms in the past several weeks based on dietary indiscretion, willing to change this rather than start medication for reflux, he is aware tha uncontrolled reflux may be the source of this problem   Review of Systems See HPI Denies recent fever or chills. Denies sinus pressure, nasal congestion, ear pain or sore throat. Denies chest congestion, productive cough or wheezing. Denies chest pains, palpitations and leg swelling Denies abdominal pain, nausea, vomiting,diarrhea or constipation.   Denies dysuria, frequency, hesitancy or incontinence. Denies joint pain, swelling and limitation in mobility. Denies headaches, seizures, numbness, or tingling. Denies depression, anxiety or insomnia. Denies skin break down or rash.       Objective:   Physical Exam  Patient alert and oriented and in no cardiopulmonary distress.  HEENT: No facial asymmetry, EOMI, no sinus tenderness,  oropharynx pink and moist.  Neck supple no adenopathy.  Chest: Clear to auscultation bilaterally.  CVS: S1, S2 no murmurs, no S3.  ABD: Soft non tender. Bowel sounds normal.  Ext: No edema  MS: Adequate ROM spine, shoulders, hips and knees.  Skin: Intact, no ulcerations or rash noted.  Psych: Good eye contact, normal affect. Memory intact not anxious or depressed appearing.  CNS: CN 2-12 intact, power, tone and sensation normal throughout.       Assessment & Plan:

## 2011-10-29 NOTE — Assessment & Plan Note (Signed)
Acute onset, meds prescribed, pt to call if persists for ENT eval

## 2011-11-05 ENCOUNTER — Telehealth: Payer: Self-pay | Admitting: Family Medicine

## 2011-11-06 NOTE — Telephone Encounter (Signed)
Still hoarse, not coughing anymore but wants to know if he needs anything for the hoarsness. I advised voice rest as much as possible, anything else?

## 2011-11-07 ENCOUNTER — Other Ambulatory Visit: Payer: Self-pay | Admitting: Family Medicine

## 2011-11-07 ENCOUNTER — Telehealth: Payer: Self-pay | Admitting: Family Medicine

## 2011-11-07 DIAGNOSIS — R490 Dysphonia: Secondary | ICD-10-CM

## 2011-11-07 DIAGNOSIS — J309 Allergic rhinitis, unspecified: Secondary | ICD-10-CM

## 2011-11-07 MED ORDER — LISINOPRIL 40 MG PO TABS: ORAL_TABLET | ORAL | Status: DC

## 2011-11-07 MED ORDER — LORATADINE 10 MG PO TABS: 10.0000 mg | ORAL_TABLET | Freq: Every day | ORAL | Status: DC

## 2011-11-07 MED ORDER — SIMVASTATIN 40 MG PO TABS: ORAL_TABLET | ORAL | Status: DC

## 2011-11-07 NOTE — Telephone Encounter (Signed)
Refilled to [facility]. 

## 2011-11-07 NOTE — Telephone Encounter (Signed)
Needs to be seen by ENT will enter referral

## 2011-11-07 NOTE — Telephone Encounter (Signed)
Called and left message to return call. 

## 2011-11-11 NOTE — Telephone Encounter (Signed)
Patient aware.

## 2011-11-27 ENCOUNTER — Ambulatory Visit (INDEPENDENT_AMBULATORY_CARE_PROVIDER_SITE_OTHER): Payer: Medicare Other | Admitting: Otolaryngology

## 2011-11-27 DIAGNOSIS — R490 Dysphonia: Secondary | ICD-10-CM

## 2011-11-27 DIAGNOSIS — K219 Gastro-esophageal reflux disease without esophagitis: Secondary | ICD-10-CM

## 2011-12-10 ENCOUNTER — Telehealth: Payer: Self-pay

## 2011-12-10 NOTE — Telephone Encounter (Signed)
Are in [place]. Had stomach virus since Sunday. Has had diarrhea up to 8 times this am. Not throwing up anymore. Just nauseated  [facility] [address] [phone].  Feels weak. Wants something called in for the diarrhea because they can't even get on the road to come back home because of the diarrhea. Recommended BRAT and plently of fluids. Immodium not helping. Advised might have to be evaluated in [place] but she wanted me to see if something could be sent in first.  [phone]

## 2011-12-10 NOTE — Telephone Encounter (Signed)
Called cell phone with no answer. Left message to return call.

## 2011-12-10 NOTE — Telephone Encounter (Signed)
Sounds as though he really needs to be seen since vomiting as many as 8 times this morning on day 4, the potassium may be low and he may be dehydrated. The OTC med should have helped , safest for him to be seen

## 2011-12-10 NOTE — Telephone Encounter (Signed)
Sorry so sick

## 2011-12-25 ENCOUNTER — Ambulatory Visit (INDEPENDENT_AMBULATORY_CARE_PROVIDER_SITE_OTHER): Payer: Medicare Other | Admitting: Otolaryngology

## 2011-12-25 DIAGNOSIS — J343 Hypertrophy of nasal turbinates: Secondary | ICD-10-CM

## 2011-12-25 DIAGNOSIS — K219 Gastro-esophageal reflux disease without esophagitis: Secondary | ICD-10-CM

## 2011-12-25 DIAGNOSIS — J310 Chronic rhinitis: Secondary | ICD-10-CM

## 2011-12-25 DIAGNOSIS — R490 Dysphonia: Secondary | ICD-10-CM

## 2012-01-27 ENCOUNTER — Encounter: Payer: Self-pay | Admitting: Gastroenterology

## 2012-01-27 LAB — BASIC METABOLIC PANEL
BUN: 14 mg/dL (ref 6–23)
CO2: 22 mEq/L (ref 19–32)
Calcium: 9.3 mg/dL (ref 8.4–10.5)
Chloride: 108 mEq/L (ref 96–112)
Creat: 1.34 mg/dL (ref 0.50–1.35)
Glucose, Bld: 83 mg/dL (ref 70–99)
Potassium: 4.2 mEq/L (ref 3.5–5.3)
Sodium: 144 mEq/L (ref 135–145)

## 2012-01-27 LAB — HEPATIC FUNCTION PANEL
ALT: 18 U/L (ref 0–53)
AST: 21 U/L (ref 0–37)
Albumin: 4.2 g/dL (ref 3.5–5.2)
Alkaline Phosphatase: 41 U/L (ref 39–117)
Bilirubin, Direct: 0.1 mg/dL (ref 0.0–0.3)
Indirect Bilirubin: 0.3 mg/dL (ref 0.0–0.9)
Total Bilirubin: 0.4 mg/dL (ref 0.3–1.2)
Total Protein: 6.1 g/dL (ref 6.0–8.3)

## 2012-01-27 LAB — LIPID PANEL
Cholesterol: 195 mg/dL (ref 0–200)
HDL: 42 mg/dL (ref >39)
LDL Cholesterol: 117 mg/dL — ABNORMAL HIGH (ref 0–99)
Total CHOL/HDL Ratio: 4.6 Ratio
Triglycerides: 181 mg/dL — ABNORMAL HIGH (ref <150)
VLDL: 36 mg/dL (ref 0–40)

## 2012-02-09 ENCOUNTER — Encounter: Payer: Self-pay | Admitting: Family Medicine

## 2012-02-09 ENCOUNTER — Ambulatory Visit (INDEPENDENT_AMBULATORY_CARE_PROVIDER_SITE_OTHER): Payer: Medicare Other | Admitting: Family Medicine

## 2012-02-09 VITALS — BP 128/82 | HR 70 | Resp 15 | Ht 69.0 in | Wt 205.0 lb

## 2012-02-09 DIAGNOSIS — Z1211 Encounter for screening for malignant neoplasm of colon: Secondary | ICD-10-CM

## 2012-02-09 DIAGNOSIS — E785 Hyperlipidemia, unspecified: Secondary | ICD-10-CM

## 2012-02-09 DIAGNOSIS — J984 Other disorders of lung: Secondary | ICD-10-CM

## 2012-02-09 DIAGNOSIS — I10 Essential (primary) hypertension: Secondary | ICD-10-CM

## 2012-02-09 LAB — POC HEMOCCULT BLD/STL (OFFICE/1-CARD/DIAGNOSTIC): Fecal Occult Blood, POC: NEGATIVE

## 2012-02-09 MED ORDER — TERAZOSIN HCL 2 MG PO CAPS: 2.0000 mg | ORAL_CAPSULE | Freq: Every day | ORAL | Status: DC

## 2012-02-09 NOTE — Assessment & Plan Note (Signed)
Controlled, however will change ace due to possibility of iontolerance

## 2012-02-09 NOTE — Progress Notes (Signed)
  Subjective:    Patient ID: [name] [name], male    DOB: 14-Sep-1941, 71 y.o.   MRN: [id number]  HPI The PT is here for follow up and re-evaluation of chronic medical conditions, medication management and review of any available recent lab and radiology data.  Preventive health is updated, specifically  Cancer screening and Immunization.   Questions or concerns regarding consultations or procedures which the PT has had in the interim are  Addressed.He has bee following with ENT with chronic hoarseness which is still persisting longer than hoped. He also reports a painless tickle in th throat, which i believe may be ACE related and a part of the problem     Review of Systems See HPI Denies recent fever or chills. Denies sinus pressure, nasal congestion, ear pain Denies chest congestion, productive cough or wheezing. Denies chest pains, palpitations and leg swelling Denies abdominal pain, nausea, vomiting,diarrhea or constipation.   Denies dysuria, frequency, hesitancy or incontinence. Denies joint pain, swelling and limitation in mobility. Denies headaches, seizures, numbness, or tingling. Denies depression, anxiety or insomnia. Denies skin break down or rash.        Objective:   Physical Exam  Pleasant well nourished male, alert and oriented x 3, in no cardio-pulmonary distress. Afebrile. HEENT No facial trauma or asymetry. Sinuses non tender. EOMI, PERTL, External ears normal, tympanic membranes clear. Oropharynx moist, no exudate, fair dentition. Neck: supple, no adenopathy,JVD or thyromegaly.No bruits.  Chest: Clear to ascultation bilaterally.No crackles or wheezes. Non tender to palpation  Breast: No asymetry,no masses. No nipple discharge or inversion. No axillary or supraclavicular adenopathy  Cardiovascular system; Heart sounds normal,  S1 and  S2 ,no S3.  No murmur, or thrill. Apical beat not displaced Peripheral pulses normal.  Abdomen: Soft, non tender,  no organomegaly or masses. No bruits. Bowel sounds normal. No guarding, tenderness or rebound.  Rectal:  No mass. guaiac negative stool. Prostate smooth and firm  Musculoskeletal exam: Full ROM of spine, hips , shoulders and knees. No deformity ,swelling or crepitus noted. No muscle wasting or atrophy.   Neurologic: Cranial nerves 2 to 12 intact. Power, tone ,sensation and reflexes normal throughout. No disturbance in gait. No tremor.  Skin: Intact, no ulceration, erythema , scaling or rash noted. Pigmentation normal throughout  Psych; Normal mood and affect. Judgement and concentration normal        Assessment & Plan:

## 2012-02-09 NOTE — Patient Instructions (Addendum)
F/u in 6 to 8 weeks.  New medication in place of lisinopril for blood pressure control, which will also help your urinary stream. Take medication at bedtime the same time every night please.  Stop lisinopril, I believe this may be irritating your throat also.  Please cut back on fried and fatty foods, country ham and cheese.  It is important that you exercise regularly at least 30 minutes 5 times a week. If you develop chest pain, have severe difficulty breathing, or feel very tired, stop exercising immediately and seek medical attention  A healthy diet is rich in fruit, vegetables and whole grains. Poultry fish, nuts and beans are a healthy choice for protein rather then red meat. A low sodium diet and drinking 64 ounces of water daily is generally recommended. Oils and sweet should be limited. Carbohydrates especially for those who are diabetic or overweight, should be limited to 34-45 gram per meal. It is important to eat on a regular schedule, at least 3 times daily. Snacks should be primarily fruits, vegetables or nuts.   Remember, communicate in writing only for 2 to 3 days, and see how much better your voice becomes!:-)

## 2012-02-22 NOTE — Assessment & Plan Note (Signed)
No need for further radiologic f/u unless new symptoms develop

## 2012-02-22 NOTE — Assessment & Plan Note (Signed)
Deteriorated , neds to follow low fat diet, no med change

## 2012-03-25 ENCOUNTER — Ambulatory Visit (INDEPENDENT_AMBULATORY_CARE_PROVIDER_SITE_OTHER): Payer: Medicare Other | Admitting: Otolaryngology

## 2012-03-25 DIAGNOSIS — R490 Dysphonia: Secondary | ICD-10-CM

## 2012-04-12 ENCOUNTER — Ambulatory Visit (INDEPENDENT_AMBULATORY_CARE_PROVIDER_SITE_OTHER): Payer: Medicare Other | Admitting: Family Medicine

## 2012-04-12 ENCOUNTER — Encounter: Payer: Self-pay | Admitting: Family Medicine

## 2012-04-12 VITALS — BP 130/82 | HR 69 | Resp 18 | Ht 69.0 in | Wt 204.0 lb

## 2012-04-12 DIAGNOSIS — D126 Benign neoplasm of colon, unspecified: Secondary | ICD-10-CM

## 2012-04-12 DIAGNOSIS — K635 Polyp of colon: Secondary | ICD-10-CM | POA: Insufficient documentation

## 2012-04-12 DIAGNOSIS — I10 Essential (primary) hypertension: Secondary | ICD-10-CM

## 2012-04-12 DIAGNOSIS — Z125 Encounter for screening for malignant neoplasm of prostate: Secondary | ICD-10-CM

## 2012-04-12 DIAGNOSIS — E663 Overweight: Secondary | ICD-10-CM

## 2012-04-12 DIAGNOSIS — E785 Hyperlipidemia, unspecified: Secondary | ICD-10-CM

## 2012-04-12 NOTE — Assessment & Plan Note (Signed)
History of polyp, no change in BM, no family h/o colon cancer.guaiac neg stool in 11/2011 Will refer to gi to determine if rept colonoscopy needed per letter sent to pt in April

## 2012-04-12 NOTE — Progress Notes (Signed)
  Subjective:    Patient ID: [name] [name], male    DOB: [birth year], 71 y.o.   MRN: [id number]  HPI The PT is here for follow up and re-evaluation of chronic medical conditions, medication management and review of any available recent lab and radiology data.  Preventive health is updated, specifically  Cancer screening and Immunization.   Questions or concerns regarding consultations or procedures which the PT has had in the interim are  Addressed.Was being referred to voice school at [facility], however he is now having improvement in speech.S The PT denies any adverse reactions to current medications since the last visit. States he is having much less nocturia with hytrin and no adverse s/e Has worked on dietary change with weight loss and intends to continue same      Review of Systems See HPI Denies recent fever or chills. Denies sinus pressure, nasal congestion, ear pain or sore throat. Denies chest congestion, productive cough or wheezing. Denies chest pains, palpitations and leg swelling Denies abdominal pain, nausea, vomiting,diarrhea or constipation.  Needs GI re eval to determine if 5 year f/u colonoscopy actually needed per recent letter sent Denies dysuria, frequency, hesitancy or incontinence. Denies joint pain, swelling and limitation in mobility. Denies headaches, seizures, numbness, or tingling. Denies depression, anxiety or insomnia. Denies skin break down or rash.        Objective:   Physical Exam  Patient alert and oriented and in no cardiopulmonary distress.  HEENT: No facial asymmetry, EOMI, no sinus tenderness,  oropharynx pink and moist.  Neck supple no adenopathy.  Chest: Clear to auscultation bilaterally.  CVS: S1, S2 no murmurs, no S3.  ABD: Soft non tender. Bowel sounds normal.  Ext: No edema  MS: Adequate ROM spine, shoulders, hips and knees.  Skin: Intact, no ulcerations or rash noted.  Psych: Good eye contact, normal affect. Memory intact  not anxious or depressed appearing.  CNS: CN 2-12 intact, power, tone and sensation normal throughout.       Assessment & Plan:

## 2012-04-12 NOTE — Assessment & Plan Note (Signed)
Improved. Pt applauded on succesful weight loss through lifestyle change, and encouraged to continue same. Weight loss goal set for the next several months.  

## 2012-04-12 NOTE — Assessment & Plan Note (Signed)
Controlled, no change in medication  

## 2012-04-12 NOTE — Assessment & Plan Note (Signed)
Uncontrolled, pt working on diet prim[facility]

## 2012-04-12 NOTE — Patient Instructions (Addendum)
F/u early January or mid December .Please call if you need me before  Fasting lipid, cmp, TSH and PSA in mid November  I am happy your voice is improved  Blood pressure is excellent  Weight loss goal by diet change and regular activity of 6 to 10 pounds in the next 6 month.  Congrats on the 11 pound weight loss to date.  No changes in medication You are referred to Dr Darrick Penna for f/u

## 2012-04-13 ENCOUNTER — Telehealth: Payer: Self-pay | Admitting: Family Medicine

## 2012-04-13 DIAGNOSIS — I10 Essential (primary) hypertension: Secondary | ICD-10-CM

## 2012-04-13 MED ORDER — SIMVASTATIN 40 MG PO TABS: ORAL_TABLET | ORAL | Status: DC

## 2012-04-13 MED ORDER — TERAZOSIN HCL 2 MG PO CAPS: 2.0000 mg | ORAL_CAPSULE | Freq: Every day | ORAL | Status: DC

## 2012-04-13 NOTE — Telephone Encounter (Signed)
Patient aware.

## 2012-04-13 NOTE — Telephone Encounter (Signed)
Sent in for 7 day supply

## 2012-04-14 ENCOUNTER — Encounter: Payer: Self-pay | Admitting: Gastroenterology

## 2012-05-04 ENCOUNTER — Encounter: Payer: Self-pay | Admitting: Urgent Care

## 2012-05-04 ENCOUNTER — Ambulatory Visit (INDEPENDENT_AMBULATORY_CARE_PROVIDER_SITE_OTHER): Payer: Medicare Other | Admitting: Urgent Care

## 2012-05-04 ENCOUNTER — Other Ambulatory Visit: Payer: Self-pay | Admitting: Gastroenterology

## 2012-05-04 VITALS — BP 135/87 | HR 80 | Temp 97.8°F | Ht 69.0 in | Wt 204.8 lb

## 2012-05-04 DIAGNOSIS — K635 Polyp of colon: Secondary | ICD-10-CM

## 2012-05-04 DIAGNOSIS — Z86010 Personal history of colonic polyps: Secondary | ICD-10-CM

## 2012-05-04 DIAGNOSIS — D126 Benign neoplasm of colon, unspecified: Secondary | ICD-10-CM

## 2012-05-04 MED ORDER — PEG-KCL-NACL-NASULF-NA ASC-C 100 G PO SOLR: 1.0000 | Freq: Once | ORAL | Status: DC

## 2012-05-04 NOTE — Patient Instructions (Signed)
Colonoscopy w/ Dr Darrick Penna

## 2012-05-04 NOTE — Assessment & Plan Note (Signed)
[name] [name] is a pleasant 71 y.o. male  w/ hx colon polyp that was not retrieved on last colonoscopy 2008.  Due for surveillance colonoscopy.  I have discussed risks & benefits which include, but are not limited to, bleeding, infection, perforation & drug reaction.  The patient agrees with this plan & written consent will be obtained.

## 2012-05-04 NOTE — Progress Notes (Signed)
Faxed to PCP, Dr Simpson 

## 2012-05-04 NOTE — Progress Notes (Signed)
Referring Provider: Kerri Perches, MD Primary Care Physician:  Syliva Overman, MD Primary Gastroenterologist:  Dr. Jonette Eva  Chief Complaint  Patient presents with  . Colonoscopy    Hx colon polyps    HPI:  [name] [name] is a 71 y.o. male here as a referral from Dr. Lodema Hong for surveillance colonoscopy.  He has history of 3-MM polyp in the sigmoid colon which disappeared with full insufflation, suggesting that was hyperplastic, but could not be retrieved on last colonoscopy by Dr. Darrick Penna May 2008.  He has done very well since that time.  Denies any lower GI symptoms including constipation, diarrhea, rectal bleeding, melena or weight loss.   Past Medical History  Diagnosis Date  . Hypertension 2003  . Hyperlipidemia 2003  . History of migraines childhood     Flared up in 2009, and have responded to depakote which was stopped in 9 months  . Lung nodules since 2009    CT scan advised in 08/2010  . Neoplasm     dermal  . Overweight   . Anemia, iron deficiency   . Hiatal hernia     small  . Hemorrhoids     and minimal polp  . GERD (gastroesophageal reflux disease)   . Hx of colonic polyps 02/24/2007    Past Surgical History  Procedure Date  . Removal of skin cancer twice   . Colonoscopy 2008    Fields-mod IH, 3mm polyp sigmoid colon (disappeared w/ insufflation & could not be retrieved)  . Esophagogastroduodenoscopy 01/05/2007    Fields-sm HH, benign SB biopsy  . Givens capsule study 01/20/2007    Fields-nothing to explain IDA, lymphectasias    Current Outpatient Prescriptions  Medication Sig Dispense Refill  . aspirin (ASPIRIN LOW DOSE) 81 MG EC tablet Take 81 mg by mouth daily. Take one tablet by mouth daily       . Multiple Vitamin (MULTIVITAMIN PO) Take by mouth daily. Take one tablet by mouth daily       . Omega-3 Fatty Acids (FISH OIL MAXIMUM STRENGTH) 1200 MG CAPS Take by mouth.      Marland Kitchen omeprazole (PRILOSEC) 20 MG capsule Take 20 mg by mouth daily.      .  simvastatin (ZOCOR) 40 MG tablet TAKE ONE TABLET BY MOUTH EVERY DAY AT BEDTIME  7 tablet  0  . terazosin (HYTRIN) 2 MG capsule Take 1 capsule (2 mg total) by mouth at bedtime.  7 capsule  0  . peg 3350 powder (MOVIPREP) 100 G SOLR Take 1 kit (100 g total) by mouth once. As directed Please purchase 1 Fleets enema to use with the prep  1 kit  0   Current Facility-Administered Medications  Medication Dose Route Frequency Provider Last Rate Last Dose  . Influenza (>/= 3 years) inactive virus vaccine (FLVIRIN/FLUZONE) injection SUSP 0.5 mL  0.5 mL Intramuscular Once Kerri Perches, MD        Allergies as of 05/04/2012 - Review Complete 05/04/2012  Allergen Reaction Noted  . Ace inhibitors Cough 02/09/2012    Family History:There is no known family history of colorectal carcinoma , liver disease, or inflammatory bowel disease.  Problem Relation Age of Onset  . Heart defect Mother     pacemaker   . Heart defect Brother     bradycardia   . Alzheimer's disease Mother 50    2011  . Heart failure Father     History   Social History  . Marital Status: Married  Spouse Name: N/A    Number of Children: 1  . Years of Education: N/A   Occupational History  . retired, Visual merchandiser    Social History Main Topics  . Smoking status: Never Smoker   . Smokeless tobacco: Former Neurosurgeon    Types: Snuff, Chew  . Alcohol Use: No  . Drug Use: No  . Sexually Active: Not on file   Other Topics Concern  . Not on file   Social History Narrative   1 healthy daughter    Review of Systems: Gen: Denies any fever, chills, sweats, anorexia, fatigue, weakness, malaise, weight loss, and sleep disorder CV: Denies chest pain, angina, palpitations, syncope, orthopnea, PND, peripheral edema, and claudication. Resp: Denies dyspnea at rest, dyspnea with exercise, cough, sputum, wheezing, coughing up blood, and pleurisy. GI: Denies vomiting blood, jaundice, and fecal incontinence.   Denies dysphagia  or odynophagia. GU : Denies urinary burning, blood in urine, urinary frequency, urinary hesitancy, nocturnal urination, and urinary incontinence. MS: Denies joint pain, limitation of movement, and swelling, stiffness, low back pain, extremity pain. Denies muscle weakness, cramps, atrophy.  Derm: Denies rash, itching, dry skin, hives, moles, warts, or unhealing ulcers.  Psych: Denies depression, anxiety, memory loss, suicidal ideation, hallucinations, paranoia, and confusion. Heme: Denies bruising, bleeding, and enlarged lymph nodes. Neuro:  Denies any headaches, dizziness, paresthesias. Endo:  Denies any problems with DM, thyroid, adrenal function.  Physical Exam: BP 135/87  Pulse 80  Temp 97.8 F (36.6 C) (Temporal)  Ht 5\' 9"  (1.753 m)  Wt 204 lb 12.8 oz (92.897 kg)  BMI 30.24 kg/m2 General:   Alert,  Well-developed, well-nourished, pleasant and cooperative in NAD Head:  Normocephalic and atraumatic. Eyes:  Sclera clear, no icterus.   Conjunctiva pink. Ears:  Normal auditory acuity. Nose:  No deformity, discharge, or lesions. Mouth:  No deformity or lesions,oropharynx pink & moist. Neck:  Supple; no masses or thyromegaly. Lungs:  Clear throughout to auscultation.   No wheezes, crackles, or rhonchi. No acute distress. Heart:  Regular rate and rhythm; no murmurs, clicks, rubs,  or gallops. Abdomen:  Normal bowel sounds.  No bruits.  Soft, non-tender and non-distended without masses, hepatosplenomegaly or hernias noted.  No guarding or rebound tenderness.   Rectal:  Deferred. Msk:  Symmetrical without gross deformities. Normal posture. Pulses:  Normal pulses noted. Extremities:  No clubbing or edema. Neurologic:  Alert and oriented x4;  grossly normal neurologically. Skin:  Intact without significant lesions or rashes. Lymph Nodes:  No significant cervical adenopathy. Psych:  Alert and cooperative. Normal mood and affect.

## 2012-05-07 ENCOUNTER — Encounter (HOSPITAL_COMMUNITY): Payer: Self-pay | Admitting: Pharmacy Technician

## 2012-05-19 ENCOUNTER — Encounter (HOSPITAL_COMMUNITY)
Admission: RE | Disposition: A | Discharge status: Home / Self Care | Payer: Self-pay | Source: Ambulatory Visit | Attending: Gastroenterology

## 2012-05-19 ENCOUNTER — Encounter (HOSPITAL_COMMUNITY): Payer: Self-pay | Admitting: *Deleted

## 2012-05-19 ENCOUNTER — Ambulatory Visit (HOSPITAL_COMMUNITY)
Admission: RE | Admit: 2012-05-19 | Discharge: 2012-05-19 | Disposition: A | Discharge status: Home / Self Care | Payer: Medicare Other | Source: Ambulatory Visit | Attending: Gastroenterology | Admitting: Gastroenterology

## 2012-05-19 DIAGNOSIS — Z860100 Personal history of colon polyps, unspecified: Secondary | ICD-10-CM | POA: Insufficient documentation

## 2012-05-19 DIAGNOSIS — Z09 Encounter for follow-up examination after completed treatment for conditions other than malignant neoplasm: Secondary | ICD-10-CM | POA: Insufficient documentation

## 2012-05-19 DIAGNOSIS — E785 Hyperlipidemia, unspecified: Secondary | ICD-10-CM | POA: Insufficient documentation

## 2012-05-19 DIAGNOSIS — Z79899 Other long term (current) drug therapy: Secondary | ICD-10-CM | POA: Insufficient documentation

## 2012-05-19 DIAGNOSIS — K648 Other hemorrhoids: Secondary | ICD-10-CM

## 2012-05-19 DIAGNOSIS — I10 Essential (primary) hypertension: Secondary | ICD-10-CM | POA: Insufficient documentation

## 2012-05-19 HISTORY — DX: Personal history of other diseases of the digestive system: Z87.19

## 2012-05-19 HISTORY — PX: COLONOSCOPY: SHX5424

## 2012-05-19 SURGERY — COLONOSCOPY
Anesthesia: Moderate Sedation

## 2012-05-19 MED ORDER — MEPERIDINE HCL 100 MG/ML IJ SOLN
INTRAMUSCULAR | Status: AC
  Filled 2012-05-19: qty 2

## 2012-05-19 MED ORDER — STERILE WATER FOR IRRIGATION IR SOLN
Status: DC | PRN
  Administered 2012-05-19: 08:00:00

## 2012-05-19 MED ORDER — MIDAZOLAM HCL 5 MG/5ML IJ SOLN
INTRAMUSCULAR | Status: AC
  Filled 2012-05-19: qty 10

## 2012-05-19 MED ORDER — MEPERIDINE HCL 100 MG/ML IJ SOLN
INTRAMUSCULAR | Status: DC | PRN
  Administered 2012-05-19 (×2): 25 mg via INTRAVENOUS

## 2012-05-19 MED ORDER — SODIUM CHLORIDE 0.45 % IV SOLN
Freq: Once | INTRAVENOUS | Status: AC
  Administered 2012-05-19: 08:00:00 via INTRAVENOUS

## 2012-05-19 MED ORDER — MIDAZOLAM HCL 5 MG/5ML IJ SOLN
INTRAMUSCULAR | Status: DC | PRN
  Administered 2012-05-19: 2 mg via INTRAVENOUS
  Administered 2012-05-19: 1 mg via INTRAVENOUS

## 2012-05-19 NOTE — H&P (Signed)
Primary Care Physician:  Syliva Overman, MD Primary Gastroenterologist:  Dr. Darrick Penna  Pre-Procedure History & Physical: HPI:  [name] [name] is a 71 y.o. male here for  PERSONAL HISTORY OF POLYPS.   Past Medical History  Diagnosis Date  . Hypertension 2003  . Hyperlipidemia 2003  . History of migraines childhood     Flared up in 2009, and have responded to depakote which was stopped in 9 months  . Lung nodules since 2009    CT scan advised in 08/2010  . Neoplasm     dermal  . Overweight   . Anemia, iron deficiency   . Hemorrhoids     and minimal polp  . GERD (gastroesophageal reflux disease)   . Hx of colonic polyps 02/24/2007  . H/O hiatal hernia     Past Surgical History  Procedure Date  . Removal of skin cancer twice   . Colonoscopy 2008    Elainah Rhyne-mod IH, 3mm polyp sigmoid colon (disappeared w/ insufflation & could not be retrieved)  . Esophagogastroduodenoscopy 01/05/2007    Ayahna Solazzo-sm HH, benign SB biopsy  . Givens capsule study 01/20/2007    Raylynne Cubbage-nothing to explain IDA, lymphectasias    Prior to Admission medications   Medication Sig Start Date End Date Taking? Authorizing Provider  aspirin (ASPIRIN LOW DOSE) 81 MG EC tablet Take 81 mg by mouth every morning.    Yes Historical Provider, MD  Multiple Vitamin (MULTIVITAMIN WITH MINERALS) TABS Take 1 tablet by mouth daily.   Yes Historical Provider, MD  omeprazole (PRILOSEC) 20 MG capsule Take 20 mg by mouth every morning.    Yes Historical Provider, MD  simvastatin (ZOCOR) 40 MG tablet TAKE ONE TABLET BY MOUTH EVERY DAY AT BEDTIME 04/13/12  Yes Kerri Perches, MD  terazosin (HYTRIN) 2 MG capsule Take 1 capsule (2 mg total) by mouth at bedtime. 04/13/12 04/13/13 Yes Kerri Perches, MD  Omega-3 Fatty Acids (FISH OIL MAXIMUM STRENGTH) 1200 MG CAPS Take 1 capsule by mouth every morning.     Historical Provider, MD  ZOSTAVAX [address] UNT/0.65ML injection  02/09/12   Historical Provider, MD    Allergies as of  05/04/2012 - Review Complete 05/04/2012  Allergen Reaction Noted  . Ace inhibitors Cough 02/09/2012    Family History  Problem Relation Age of Onset  . Heart defect Mother     pacemaker   . Alzheimer's disease Mother 13    2011  . Heart defect Brother     bradycardia   . Heart failure Father   . Colon cancer Neg Hx     History   Social History  . Marital Status: Married    Spouse Name: N/A    Number of Children: 1  . Years of Education: N/A   Occupational History  . retired, Visual merchandiser    Social History Main Topics  . Smoking status: Former Smoker -- 2.0 packs/day for 30 years    Types: Cigarettes  . Smokeless tobacco: Former Neurosurgeon    Types: Snuff, Chew  . Alcohol Use: No  . Drug Use: No  . Sexually Active: Not on file   Other Topics Concern  . Not on file   Social History Narrative   1 healthy daughter    Review of Systems: See HPI, otherwise negative ROS   Physical Exam: BP 144/89  Pulse 61  Temp 97.6 F (36.4 C) (Oral)  Resp 15  Ht 5\' 10"  (1.778 m)  Wt 204 lb (92.534 kg)  BMI 29.27  kg/m2  SpO2 100% General:   Alert,  pleasant and cooperative in NAD Head:  Normocephalic and atraumatic. Neck:  Supple;  Lungs:  Clear throughout to auscultation.    Heart:  Regular rate and rhythm. Abdomen:  Soft, nontender and nondistended. Normal bowel sounds, without guarding, and without rebound.   Neurologic:  Alert and  oriented x4;  grossly normal neurologically.  Impression/Plan:     PERSONAL HISTORY OF POLYPS.  Plan: 1. tcs today

## 2012-05-19 NOTE — Op Note (Signed)
[facility] [address] [place], [place]  [address]  COLONOSCOPY PROCEDURE REPORT  PATIENT:  [name], [name]  MR#:  [id number] BIRTHDATE:  [birth year], 70 yrs. old  GENDER:  male  ENDOSCOPIST:  Jonette Eva, MD REF. BY:  Syliva Overman, M.D. ASSISTANT:  PROCEDURE DATE:  05/19/2012 PROCEDURE:  Colonoscopy 19147  INDICATIONS:  PERSONAL HISTORY OF POLYPS  LAST TCS 2008-HYPERPLASTIC APPEARING LESION NOT REMOVED FROM SIGMOID COLON  MEDICATIONS:   Demerol 50 mg IV, Versed 3 mg IV  DESCRIPTION OF PROCEDURE:    Physical exam was performed. Informed consent was obtained from the patient after explaining the benefits, risks, and alternatives to procedure.  The patient was connected to monitor and placed in left lateral position. Continuous oxygen was provided by nasal cannula and IV medicine administered through an indwelling cannula.  After administration of sedation and rectal exam, the patient's rectum was intubated and the EC-3490Li ([id number]) colonoscope was advanced under direct visualization to the cecum.  The scope was removed slowly by carefully examining the color, texture, anatomy, and integrity mucosa on the way out.  The patient was recovered in endoscopy and discharged home in satisfactory condition. <<PROCEDUREIMAGES>>  FINDINGS:  Normal colonoscopy without polyps, masses, vascular ectasias, DIVERTICULA, or inflammatory changes.  SMALL Internal Hemorrhoids were found.  PREP QUALITY:    EXCELLENT CECAL W/D TIME:      15 minutes  COMPLICATIONS:    None  ENDOSCOPIC IMPRESSION: 1) Internal hemorrhoids  RECOMMENDATIONS: HIGH FIBER DIET TCS IN 10 YEARS IF THE BENEFITS OUTWEIGH THE RISKS  REPEAT EXAM:  No  ______________________________ Jonette Eva, MD  CC:  Syliva Overman, M.D.  n. eSIGNED:   Reshad Saab at 05/19/2012 09:23 AM  [employer], [phone]

## 2012-05-21 ENCOUNTER — Encounter (HOSPITAL_COMMUNITY): Payer: Self-pay | Admitting: Gastroenterology

## 2012-06-09 NOTE — Progress Notes (Signed)
JUL 2013 TCS: IH  REVIEWED.

## 2012-07-22 ENCOUNTER — Ambulatory Visit (INDEPENDENT_AMBULATORY_CARE_PROVIDER_SITE_OTHER): Payer: Medicare Other | Admitting: Family Medicine

## 2012-07-22 ENCOUNTER — Encounter: Payer: Self-pay | Admitting: Family Medicine

## 2012-07-22 ENCOUNTER — Ambulatory Visit (HOSPITAL_COMMUNITY)
Admission: RE | Admit: 2012-07-22 | Discharge: 2012-07-22 | Disposition: A | Discharge status: Home / Self Care | Payer: Medicare Other | Source: Ambulatory Visit | Attending: Family Medicine | Admitting: Family Medicine

## 2012-07-22 VITALS — BP 140/76 | HR 75 | Temp 97.6°F | Resp 18 | Ht 69.0 in | Wt 206.1 lb

## 2012-07-22 DIAGNOSIS — J309 Allergic rhinitis, unspecified: Secondary | ICD-10-CM

## 2012-07-22 DIAGNOSIS — J40 Bronchitis, not specified as acute or chronic: Secondary | ICD-10-CM

## 2012-07-22 DIAGNOSIS — J0100 Acute maxillary sinusitis, unspecified: Secondary | ICD-10-CM

## 2012-07-22 DIAGNOSIS — I10 Essential (primary) hypertension: Secondary | ICD-10-CM

## 2012-07-22 DIAGNOSIS — J209 Acute bronchitis, unspecified: Secondary | ICD-10-CM | POA: Insufficient documentation

## 2012-07-22 MED ORDER — PENICILLIN V POTASSIUM 500 MG PO TABS: 500.0000 mg | ORAL_TABLET | Freq: Three times a day (TID) | ORAL | Status: DC

## 2012-07-22 MED ORDER — CEFTRIAXONE SODIUM 1 G IJ SOLR
500.0000 mg | Freq: Once | INTRAMUSCULAR | Status: AC
  Administered 2012-07-22: 500 mg via INTRAMUSCULAR

## 2012-07-22 MED ORDER — PREDNISONE (PAK) 5 MG PO TABS: 5.0000 mg | ORAL_TABLET | ORAL | Status: DC

## 2012-07-22 MED ORDER — METHYLPREDNISOLONE ACETATE 80 MG/ML IJ SUSP
80.0000 mg | Freq: Once | INTRAMUSCULAR | Status: AC
  Administered 2012-07-22: 80 mg via INTRAMUSCULAR

## 2012-07-22 NOTE — Progress Notes (Signed)
  Subjective:    Patient ID: [name] [name], male    DOB: Sep 02, 1941, 71 y.o.   MRN: [id number]  HPI 4 day h/o increased maxillary pressure with excessive drainage and aggravating cough with yellow nasal drainage and yellow sputum which is thick. Has had intermittent chills , uncertain of fever. Does not feel well, has had pneumonia in the past, and he and his wife are anxious that this does not recur, he is still suffering intermittent loss of voice   Review of Systems See HPI  Denies chest pains, palpitations and leg swelling Denies abdominal pain, nausea, vomiting,diarrhea or constipation.   Denies dysuria, frequency, hesitancy or incontinence. Denies joint pain, swelling and limitation in mobility. Denies headaches, seizures, numbness, or tingling. Denies depression, anxiety or insomnia. Denies skin break down or rash.        Objective:   Physical Exam Patient alert and oriented and in mild  cardiopulmonary distress.  HEENT: No facial asymmetry, EOMI, maxillary  sinus tenderness,  oropharynx pink and moist.  Neck supple anterior cervical adenopathy.  Chest: decreased though adequate air entry, scattered crackles, no wheezes  CVS: S1, S2 no murmurs, no S3.  ABD: Soft non tender. Bowel sounds normal.  Ext: No edema  MS: Adequate ROM spine, shoulders, hips and knees.  Skin: Intact, no ulcerations or rash noted.  Psych: Good eye contact, normal affect. Memory intact not anxious or depressed appearing.  CNS: CN 2-12 intact, power, tone and sensation normal throughout.        Assessment & Plan:

## 2012-07-22 NOTE — Patient Instructions (Addendum)
F/u as before.  You are being treated for sinusitis and bronchitis.  Medication is sent to your pharmacy  Please get a CXR today, this is already ordered

## 2012-07-24 NOTE — Assessment & Plan Note (Signed)
Increased symptoms due to season change. OTC meds

## 2012-07-24 NOTE — Assessment & Plan Note (Signed)
Acute episode, antibiotic and decongestant prescribed 

## 2012-07-24 NOTE — Assessment & Plan Note (Signed)
Sub optimal control, no med change DASH diet and commitment to daily physical activity for a minimum of 30 minutes discussed and encouraged, as a part of hypertension management. The importance of attaining a healthy weight is also discussed.  

## 2012-07-24 NOTE — Assessment & Plan Note (Signed)
Acute infection , antibiotic precribed

## 2012-10-18 ENCOUNTER — Telehealth: Payer: Self-pay | Admitting: Family Medicine

## 2012-10-18 DIAGNOSIS — E785 Hyperlipidemia, unspecified: Secondary | ICD-10-CM

## 2012-10-18 DIAGNOSIS — R5381 Other malaise: Secondary | ICD-10-CM

## 2012-10-18 DIAGNOSIS — Z125 Encounter for screening for malignant neoplasm of prostate: Secondary | ICD-10-CM

## 2012-10-18 DIAGNOSIS — I10 Essential (primary) hypertension: Secondary | ICD-10-CM

## 2012-10-18 NOTE — Telephone Encounter (Signed)
Faxed. Pt aware

## 2012-10-19 LAB — COMPREHENSIVE METABOLIC PANEL
ALT: 20 U/L (ref 0–53)
AST: 26 U/L (ref 0–37)
Albumin: 4.4 g/dL (ref 3.5–5.2)
Alkaline Phosphatase: 43 U/L (ref 39–117)
BUN: 16 mg/dL (ref 6–23)
CO2: 26 mEq/L (ref 19–32)
Calcium: 9 mg/dL (ref 8.4–10.5)
Chloride: 106 mEq/L (ref 96–112)
Creat: 1.35 mg/dL (ref 0.50–1.35)
Glucose, Bld: 83 mg/dL (ref 70–99)
Potassium: 4.2 mEq/L (ref 3.5–5.3)
Sodium: 140 mEq/L (ref 135–145)
Total Bilirubin: 0.5 mg/dL (ref 0.3–1.2)
Total Protein: 6.4 g/dL (ref 6.0–8.3)

## 2012-10-19 LAB — LIPID PANEL
Cholesterol: 185 mg/dL (ref 0–200)
HDL: 50 mg/dL (ref >39)
LDL Cholesterol: 113 mg/dL — ABNORMAL HIGH (ref 0–99)
Total CHOL/HDL Ratio: 3.7 Ratio
Triglycerides: 112 mg/dL (ref <150)
VLDL: 22 mg/dL (ref 0–40)

## 2012-10-19 LAB — TSH: TSH: 2.357 u[IU]/mL (ref 0.350–4.500)

## 2012-10-19 LAB — PSA, MEDICARE: PSA: 0.65 ng/mL (ref <=4.00)

## 2012-10-22 ENCOUNTER — Ambulatory Visit (INDEPENDENT_AMBULATORY_CARE_PROVIDER_SITE_OTHER): Payer: Medicare Other | Admitting: Family Medicine

## 2012-10-22 ENCOUNTER — Encounter: Payer: Self-pay | Admitting: Family Medicine

## 2012-10-22 VITALS — BP 130/82 | HR 81 | Resp 16 | Ht 69.0 in | Wt 207.0 lb

## 2012-10-22 DIAGNOSIS — E785 Hyperlipidemia, unspecified: Secondary | ICD-10-CM

## 2012-10-22 DIAGNOSIS — J040 Acute laryngitis: Secondary | ICD-10-CM

## 2012-10-22 DIAGNOSIS — R5381 Other malaise: Secondary | ICD-10-CM

## 2012-10-22 DIAGNOSIS — I10 Essential (primary) hypertension: Secondary | ICD-10-CM

## 2012-10-22 NOTE — Progress Notes (Signed)
  Subjective:    Patient ID: [name] [name], male    DOB: May 15, 1941, 72 y.o.   MRN: [id number]  HPI The PT is here for follow up and re-evaluation of chronic medical conditions, medication management and review of any available recent lab and radiology data.  Preventive health is updated, specifically  Cancer screening and Immunization. Pneumonia vaccine status needs to be checked  Questions or concerns regarding consultations or procedures which the PT has had in the interim are  Addressed.Laryngitis has improved in the past 12 months, remains on PPI The PT denies any adverse reactions to current medications since the last visit.  There are no new concerns.  There are no specific complaints       Review of Systems See HPI Denies recent fever or chills. Denies sinus pressure, nasal congestion, ear pain or sore throat. Denies chest congestion, productive cough or wheezing. Denies chest pains, palpitations and leg swelling Denies abdominal pain, nausea, vomiting,diarrhea or constipation.   Denies dysuria, frequency, hesitancy or incontinence. Denies joint pain, swelling and limitation in mobility. Denies headaches, seizures, numbness, or tingling. Denies depression, anxiety or insomnia. Denies skin break down or rash.         Objective:   Physical Exam Patient alert and oriented and in no cardiopulmonary distress.  HEENT: No facial asymmetry, EOMI, no sinus tenderness,  oropharynx pink and moist.  Neck supple no adenopathy.  Chest: Clear to auscultation bilaterally.  CVS: S1, S2 no murmurs, no S3.  ABD: Soft non tender. Bowel sounds normal.  Ext: No edema  MS: Adequate ROM spine, shoulders, hips and knees.  Skin: Intact, no ulcerations or rash noted.  Psych: Good eye contact, normal affect. Memory intact not anxious or depressed appearing.  CNS: CN 2-12 intact, power, tone and sensation normal throughout.        Assessment & Plan:

## 2012-10-22 NOTE — Patient Instructions (Addendum)
Annual wellness in 6 month, please call if you need me before.  CBc, fasting lipid, cmp  , in 6 month  Weight loss goal of 6 pounds  ,It is important that you exercise regularly at least 30 minutes 5 times a week. If you develop chest pain, have severe difficulty breathing, or feel very tired, stop exercising immediately and seek medical attention    The patient is asked to make an attempt to improve diet and exercise patterns to aid in medical management of this problem.   No med changes

## 2012-10-23 NOTE — Assessment & Plan Note (Signed)
Controlled, no change in medication DASH diet and commitment to daily physical activity for a minimum of 30 minutes discussed and encouraged, as a part of hypertension management. The importance of attaining a healthy weight is also discussed.  

## 2012-10-23 NOTE — Assessment & Plan Note (Signed)
Improved, continue PPI 

## 2012-10-23 NOTE — Assessment & Plan Note (Signed)
Needs to reduce rd meat and fried foods, no med change

## 2013-02-09 ENCOUNTER — Other Ambulatory Visit: Payer: Self-pay

## 2013-02-09 DIAGNOSIS — I10 Essential (primary) hypertension: Secondary | ICD-10-CM

## 2013-02-09 MED ORDER — TERAZOSIN HCL 2 MG PO CAPS: 2.0000 mg | ORAL_CAPSULE | Freq: Every day | ORAL | Status: DC

## 2013-02-11 ENCOUNTER — Encounter: Payer: Self-pay | Admitting: Family Medicine

## 2013-02-11 ENCOUNTER — Ambulatory Visit (INDEPENDENT_AMBULATORY_CARE_PROVIDER_SITE_OTHER): Payer: Medicare Other | Admitting: Family Medicine

## 2013-02-11 VITALS — BP 122/80 | HR 78 | Temp 99.1°F | Resp 16 | Wt 210.0 lb

## 2013-02-11 DIAGNOSIS — B349 Viral infection, unspecified: Secondary | ICD-10-CM

## 2013-02-11 DIAGNOSIS — M7918 Myalgia, other site: Secondary | ICD-10-CM

## 2013-02-11 DIAGNOSIS — IMO0001 Reserved for inherently not codable concepts without codable children: Secondary | ICD-10-CM

## 2013-02-11 DIAGNOSIS — M791 Myalgia, unspecified site: Secondary | ICD-10-CM

## 2013-02-11 DIAGNOSIS — B9789 Other viral agents as the cause of diseases classified elsewhere: Secondary | ICD-10-CM

## 2013-02-11 LAB — COMPREHENSIVE METABOLIC PANEL
ALT: 20 U/L (ref 0–53)
AST: 27 U/L (ref 0–37)
Albumin: 4.4 g/dL (ref 3.5–5.2)
Alkaline Phosphatase: 48 U/L (ref 39–117)
BUN: 14 mg/dL (ref 6–23)
CO2: 24 mEq/L (ref 19–32)
Calcium: 9.1 mg/dL (ref 8.4–10.5)
Chloride: 106 mEq/L (ref 96–112)
Creat: 1.51 mg/dL — ABNORMAL HIGH (ref 0.50–1.35)
Glucose, Bld: 84 mg/dL (ref 70–99)
Potassium: 4.3 mEq/L (ref 3.5–5.3)
Sodium: 139 mEq/L (ref 135–145)
Total Bilirubin: 0.6 mg/dL (ref 0.3–1.2)
Total Protein: 6.6 g/dL (ref 6.0–8.3)

## 2013-02-11 LAB — CBC WITH DIFFERENTIAL/PLATELET
Basophils Absolute: 0 10*3/uL (ref 0.0–0.1)
Basophils Relative: 0 % (ref 0–1)
Eosinophils Absolute: 0 10*3/uL (ref 0.0–0.7)
Eosinophils Relative: 0 % (ref 0–5)
HCT: 40.3 % (ref 39.0–52.0)
Hemoglobin: 13.5 g/dL (ref 13.0–17.0)
Lymphocytes Relative: 7 % — ABNORMAL LOW (ref 12–46)
Lymphs Abs: 0.8 10*3/uL (ref 0.7–4.0)
MCH: 28.3 pg (ref 26.0–34.0)
MCHC: 33.5 g/dL (ref 30.0–36.0)
MCV: 84.5 fL (ref 78.0–100.0)
Monocytes Absolute: 0.9 10*3/uL (ref 0.1–1.0)
Monocytes Relative: 7 % (ref 3–12)
Neutro Abs: 10.1 10*3/uL — ABNORMAL HIGH (ref 1.7–7.7)
Neutrophils Relative %: 86 % — ABNORMAL HIGH (ref 43–77)
Platelets: 185 10*3/uL (ref 150–400)
RBC: 4.77 MIL/uL (ref 4.22–5.81)
RDW: 14.3 % (ref 11.5–15.5)
WBC: 11.8 10*3/uL — ABNORMAL HIGH (ref 4.0–10.5)

## 2013-02-11 LAB — CK: Total CK: 215 U/L (ref 7–232)

## 2013-02-11 NOTE — Patient Instructions (Signed)
Viral illness- fluids, Gatorade, Advil twice a day, rest Get the labs done Call if anything changes

## 2013-02-13 ENCOUNTER — Encounter: Payer: Self-pay | Admitting: Family Medicine

## 2013-02-13 DIAGNOSIS — B349 Viral infection, unspecified: Secondary | ICD-10-CM | POA: Insufficient documentation

## 2013-02-13 DIAGNOSIS — M791 Myalgia, unspecified site: Secondary | ICD-10-CM | POA: Insufficient documentation

## 2013-02-13 NOTE — Assessment & Plan Note (Signed)
Vitals look good, exam benign Will call this viral illness, discussed with pt so early in course difficult to treat with any particular medication Advised plenty of fluids If diarrhea worsens he can use immodium Call for any new symptoms Due for labs, check electrolytes and CBC

## 2013-02-13 NOTE — Progress Notes (Signed)
  Subjective:    Patient ID: [name] [name], male    DOB: 1941-08-11, 72 y.o.   MRN: [id number]  HPI  Pt presents with body aches, diarrhea and chills since last night. +sick contact with someone he was visiting at the hospital. No documented fever. No new meds, denies, cough, SOB, sore throat, abd pain. Diarrhea a few times at his normal bowel movement times. No tick bites.  Review of Systems - per above  GEN- denies fatigue, fever, weight loss,weakness, recent illness HEENT- denies eye drainage, change in vision, nasal discharge, CVS- denies chest pain, palpitations RESP- denies SOB, cough, wheeze ABD- denies N/V, change in stools, abd pain GU- denies dysuria, hematuria, dribbling, incontinence MSK- denies joint pain,+ muscle aches, injury Neuro- denies headache, dizziness, syncope, seizure activity      Objective:   Physical Exam GEN- NAD, alert and oriented x3,well appearing HEENT- PERRL, EOMI, non injected sclera, pink conjunctiva, MMM, oropharynx clear Neck- Supple, no LAD CVS- RRR, no murmur RESP-CTAB ABD-NABS,soft,NT,ND EXT- No edema Pulses- Radial, DP- 2+        Assessment & Plan:

## 2013-02-13 NOTE — Assessment & Plan Note (Signed)
likley due to viral syndrome, CK to be checked as on statin drug

## 2013-02-14 ENCOUNTER — Telehealth: Payer: Self-pay | Admitting: Family Medicine

## 2013-02-14 DIAGNOSIS — N289 Disorder of kidney and ureter, unspecified: Secondary | ICD-10-CM

## 2013-02-14 NOTE — Telephone Encounter (Signed)
Spoke with pt, diarrhea worsened over weekend with mild fever, no URI symptoms, immodium is helping, able to eat some, but tired Reviewed labs, WBC elevated Mild RI He is to come in this week if no improvement in symptoms, has been about 4 days with illness Repeat labs in 2-3 weeks

## 2013-03-09 ENCOUNTER — Telehealth: Payer: Self-pay

## 2013-03-10 NOTE — Telephone Encounter (Signed)
noted 

## 2013-04-19 LAB — CBC WITH DIFFERENTIAL/PLATELET
Basophils Absolute: 0 10*3/uL (ref 0.0–0.1)
Basophils Relative: 1 % (ref 0–1)
Eosinophils Absolute: 0.2 10*3/uL (ref 0.0–0.7)
Eosinophils Relative: 4 % (ref 0–5)
HCT: 40.5 % (ref 39.0–52.0)
Hemoglobin: 13.6 g/dL (ref 13.0–17.0)
Lymphocytes Relative: 37 % (ref 12–46)
Lymphs Abs: 2.1 10*3/uL (ref 0.7–4.0)
MCH: 28.1 pg (ref 26.0–34.0)
MCHC: 33.6 g/dL (ref 30.0–36.0)
MCV: 83.7 fL (ref 78.0–100.0)
Monocytes Absolute: 0.4 10*3/uL (ref 0.1–1.0)
Monocytes Relative: 7 % (ref 3–12)
Neutro Abs: 3 10*3/uL (ref 1.7–7.7)
Neutrophils Relative %: 51 % (ref 43–77)
Platelets: 199 10*3/uL (ref 150–400)
RBC: 4.84 MIL/uL (ref 4.22–5.81)
RDW: 15.1 % (ref 11.5–15.5)
WBC: 5.8 10*3/uL (ref 4.0–10.5)

## 2013-04-19 LAB — COMPREHENSIVE METABOLIC PANEL
ALT: 19 U/L (ref 0–53)
AST: 24 U/L (ref 0–37)
Albumin: 4.2 g/dL (ref 3.5–5.2)
Alkaline Phosphatase: 48 U/L (ref 39–117)
BUN: 12 mg/dL (ref 6–23)
CO2: 26 mEq/L (ref 19–32)
Calcium: 9.2 mg/dL (ref 8.4–10.5)
Chloride: 108 mEq/L (ref 96–112)
Creat: 1.4 mg/dL — ABNORMAL HIGH (ref 0.50–1.35)
Glucose, Bld: 84 mg/dL (ref 70–99)
Potassium: 4.6 mEq/L (ref 3.5–5.3)
Sodium: 141 mEq/L (ref 135–145)
Total Bilirubin: 0.5 mg/dL (ref 0.3–1.2)
Total Protein: 6.6 g/dL (ref 6.0–8.3)

## 2013-04-19 LAB — LIPID PANEL
Cholesterol: 200 mg/dL (ref 0–200)
HDL: 51 mg/dL (ref >39)
LDL Cholesterol: 113 mg/dL — ABNORMAL HIGH (ref 0–99)
Total CHOL/HDL Ratio: 3.9 Ratio
Triglycerides: 181 mg/dL — ABNORMAL HIGH (ref <150)
VLDL: 36 mg/dL (ref 0–40)

## 2013-04-29 ENCOUNTER — Encounter: Payer: Self-pay | Admitting: Family Medicine

## 2013-04-29 ENCOUNTER — Ambulatory Visit (INDEPENDENT_AMBULATORY_CARE_PROVIDER_SITE_OTHER): Payer: Medicare Other | Admitting: Family Medicine

## 2013-04-29 VITALS — BP 124/80 | HR 67 | Resp 16 | Ht 69.0 in | Wt 210.4 lb

## 2013-04-29 DIAGNOSIS — Z125 Encounter for screening for malignant neoplasm of prostate: Secondary | ICD-10-CM

## 2013-04-29 DIAGNOSIS — E785 Hyperlipidemia, unspecified: Secondary | ICD-10-CM

## 2013-04-29 DIAGNOSIS — Z0000 Encounter for general adult medical examination without abnormal findings: Secondary | ICD-10-CM

## 2013-04-29 DIAGNOSIS — I10 Essential (primary) hypertension: Secondary | ICD-10-CM

## 2013-04-29 NOTE — Patient Instructions (Addendum)
F/U in early January, call if you need me before  Flu shot in October , call for  This in October  Pneumonia vaccine today   Keep doing good things for your health   No med changes.  Less fat, meat and sugar, more natural and unprocessed  It is important that you exercise regularly at least 30 minutes 5 times a week. If you develop chest pain, have severe difficulty breathing, or feel very tired, stop exercising immediately and seek medical attention

## 2013-04-29 NOTE — Progress Notes (Signed)
Subjective:    Patient ID: [name] [name], male    DOB: [birth year], 72 y.o.   MRN: [id number]  HPI Preventive Screening-Counseling & Management   Patient present here today for a Medicare annual wellness visit.   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 No  Over the past two weeks, have you felt little interest or pleasure in doing things? No  Have you lost interest or pleasure in daily life? No  Do you often feel hopeless? No  Do you cry easily over simple problems? No   Activities of Daily Living  In your present state of health, do you have any difficulty performing the following activities?  Driving?: No Managing money?: No Feeding yourself?:No Getting from bed to chair?:No Climbing a flight of stairs?:No Preparing food and eating?:No Bathing or showering?:No Getting dressed?:No Getting to the toilet?:No Using the toilet?:No Moving around from place to place?: No  Fall Risk Assessment In the past year have you fallen or had a near fall?:No Are you currently taking any medications that make you dizzy?:No   Hearing Difficulties: No Do you often ask people to speak up or repeat themselves?:No Do you experience ringing or noises in your ears?:No Do you have difficulty understanding soft or whispered voices?:No  Cognitive  Testing  Alert? Yes Normal Appearance?Yes  Oriented to person? Yes Place? Yes  Time? Yes  Displays appropriate judgment?Yes  Can read the correct time from a watch face? yes Are you having problems remembering things?No  Advanced Directives have been discussed with the patient?Yes, full code , has living will    List the Names of Other Physician/Practitioners you currently use:    Indicate any recent Medical Services you may have received from other than [facility] providers in the past year (date may be approximate).   Assessment:    Annual Wellness Exam   Plan:    During the course of the visit the patient was educated and counseled about appropriate screening and preventive services including:  A healthy diet is rich in fruit, vegetables and whole grains. Poultry fish, nuts and beans are a healthy choice for protein rather then red meat. A low sodium diet and drinking 64 ounces of water daily is generally recommended. Oils and sweet should be limited. Carbohydrates especially for those who are diabetic or overweight, should be limited to 30-45 gram per meal. It is important to eat on a regular schedule, at least 3 times daily. Snacks should be primarily fruits, vegetables or nuts. It is important that you exercise regularly at least 30 minutes 5 times a week. If you develop chest pain, have severe difficulty breathing, or feel very tired, stop exercising immediately and seek medical attention  Immunization reviewed and updated. Cancer screening reviewed and updated    Patient Instructions (the written plan) was given to the patient.  Medicare  Attestation  I have personally reviewed:  The patient's medical and social history  Their use of alcohol, tobacco or illicit drugs  Their current medications and supplements  The patient's functional ability including ADLs,fall risks, home safety risks, cognitive, and hearing and visual impairment  Diet and physical activities  Evidence for  depression or mood disorders  The patient's weight, height, BMI, and visual acuity have been recorded in the chart. I have made referrals, counseling, and provided education to the patient based on review of the above and I have provided the patient with a written personalized care plan for preventive services.      Review of Systems     Objective:   Physical Exam        Assessment & Plan:

## 2013-04-30 DIAGNOSIS — Z0000 Encounter for general adult medical examination without abnormal findings: Secondary | ICD-10-CM | POA: Insufficient documentation

## 2013-04-30 NOTE — Assessment & Plan Note (Signed)
Annual wellness completed as documented. He needs to focus on low fat , reduced carb diet and regular exercise. Pneumonia vaccine administered to complete his immunization schedule Very functional , no limitations in ADL at this time

## 2013-07-04 ENCOUNTER — Other Ambulatory Visit: Payer: Self-pay

## 2013-07-04 DIAGNOSIS — I10 Essential (primary) hypertension: Secondary | ICD-10-CM

## 2013-07-04 MED ORDER — TERAZOSIN HCL 2 MG PO CAPS: 2.0000 mg | ORAL_CAPSULE | Freq: Every day | ORAL | Status: DC

## 2013-07-27 ENCOUNTER — Ambulatory Visit (INDEPENDENT_AMBULATORY_CARE_PROVIDER_SITE_OTHER): Payer: Medicare Other

## 2013-07-27 DIAGNOSIS — Z23 Encounter for immunization: Secondary | ICD-10-CM

## 2013-08-16 ENCOUNTER — Ambulatory Visit: Payer: Medicare Other

## 2013-09-23 ENCOUNTER — Other Ambulatory Visit: Payer: Self-pay

## 2013-09-23 DIAGNOSIS — I10 Essential (primary) hypertension: Secondary | ICD-10-CM

## 2013-09-23 MED ORDER — SIMVASTATIN 40 MG PO TABS: ORAL_TABLET | ORAL | Status: DC

## 2013-09-23 MED ORDER — OMEPRAZOLE 20 MG PO CPDR: 20.0000 mg | DELAYED_RELEASE_CAPSULE | Freq: Every morning | ORAL | Status: DC

## 2013-09-23 MED ORDER — TERAZOSIN HCL 2 MG PO CAPS: 2.0000 mg | ORAL_CAPSULE | Freq: Every day | ORAL | Status: DC

## 2013-10-31 ENCOUNTER — Encounter: Payer: Self-pay | Admitting: Family Medicine

## 2013-10-31 ENCOUNTER — Ambulatory Visit (INDEPENDENT_AMBULATORY_CARE_PROVIDER_SITE_OTHER): Payer: Medicare Other | Admitting: Family Medicine

## 2013-10-31 ENCOUNTER — Other Ambulatory Visit: Payer: Self-pay | Admitting: Family Medicine

## 2013-10-31 VITALS — BP 128/82 | HR 70 | Resp 16 | Ht 69.0 in | Wt 214.1 lb

## 2013-10-31 DIAGNOSIS — E663 Overweight: Secondary | ICD-10-CM

## 2013-10-31 DIAGNOSIS — J309 Allergic rhinitis, unspecified: Secondary | ICD-10-CM

## 2013-10-31 DIAGNOSIS — K219 Gastro-esophageal reflux disease without esophagitis: Secondary | ICD-10-CM

## 2013-10-31 DIAGNOSIS — R7301 Impaired fasting glucose: Secondary | ICD-10-CM

## 2013-10-31 DIAGNOSIS — E785 Hyperlipidemia, unspecified: Secondary | ICD-10-CM

## 2013-10-31 DIAGNOSIS — R490 Dysphonia: Secondary | ICD-10-CM

## 2013-10-31 DIAGNOSIS — I10 Essential (primary) hypertension: Secondary | ICD-10-CM

## 2013-10-31 LAB — COMPREHENSIVE METABOLIC PANEL
ALT: 19 U/L (ref 0–53)
AST: 22 U/L (ref 0–37)
Albumin: 4.5 g/dL (ref 3.5–5.2)
Alkaline Phosphatase: 49 U/L (ref 39–117)
BUN: 17 mg/dL (ref 6–23)
CO2: 30 mEq/L (ref 19–32)
Calcium: 9.4 mg/dL (ref 8.4–10.5)
Chloride: 105 mEq/L (ref 96–112)
Creat: 1.4 mg/dL — ABNORMAL HIGH (ref 0.50–1.35)
Glucose, Bld: 89 mg/dL (ref 70–99)
Potassium: 4.7 mEq/L (ref 3.5–5.3)
Sodium: 141 mEq/L (ref 135–145)
Total Bilirubin: 0.4 mg/dL (ref 0.3–1.2)
Total Protein: 6.6 g/dL (ref 6.0–8.3)

## 2013-10-31 LAB — LIPID PANEL
Cholesterol: 175 mg/dL (ref 0–200)
HDL: 48 mg/dL (ref >39)
LDL Cholesterol: 101 mg/dL — ABNORMAL HIGH (ref 0–99)
Total CHOL/HDL Ratio: 3.6 Ratio
Triglycerides: 128 mg/dL (ref <150)
VLDL: 26 mg/dL (ref 0–40)

## 2013-10-31 LAB — PSA: PSA: 0.81 ng/mL (ref <=4.00)

## 2013-10-31 NOTE — Patient Instructions (Signed)
Annual wellness  July 12 or after, call if you need me before, Rectal exam next visit  Please reconsider regular physical activity.  Weight reduction of 7 pounds in next 6 month  Fasting lipid, cmp , HBa1C,CBC

## 2013-11-14 DIAGNOSIS — K219 Gastro-esophageal reflux disease without esophagitis: Secondary | ICD-10-CM | POA: Insufficient documentation

## 2013-11-14 DIAGNOSIS — R490 Dysphonia: Secondary | ICD-10-CM | POA: Insufficient documentation

## 2013-11-14 NOTE — Assessment & Plan Note (Signed)
Deteriorated. Patient re-educated about  the importance of commitment to a  minimum of 150 minutes of exercise per week. The importance of healthy food choices with portion control discussed. Encouraged to start a food diary, count calories and to consider  joining a support group. Sample diet sheets offered. Goals set by the patient for the next several months.    

## 2013-11-14 NOTE — Progress Notes (Signed)
   Subjective:    Patient ID: [name] [name], male    DOB: 1940-10-31, 73 y.o.   MRN: [id number]  HPI The PT is here for follow up and re-evaluation of chronic medical conditions, medication management and review of any available recent lab and radiology data.  Preventive health is updated, specifically  Cancer screening and Immunization.    The PT denies any adverse reactions to current medications since the last visit.  There are no new concerns.  Chronic hoarseness is unchanged Pt states h has been keeping well,still has not comited to exercise but plans to    Review of Systems See HPI Denies recent fever or chills. Denies sinus pressure, nasal congestion, ear pain or sore throat. Denies chest congestion, productive cough or wheezing. Denies chest pains, palpitations and leg swelling Denies abdominal pain, nausea, vomiting,diarrhea or constipation.   Denies dysuria, frequency, hesitancy or incontinence. Denies joint pain, swelling and limitation in mobility. Denies headaches, seizures, numbness, or tingling. Denies depression, anxiety or insomnia. Denies skin break down or rash.        Objective:   Physical Exam  Patient alert and oriented and in no cardiopulmonary distress.  HEENT: No facial asymmetry, EOMI, no sinus tenderness,  oropharynx pink and moist.  Neck supple no adenopathy.  Chest: Clear to auscultation bilaterally.  CVS: S1, S2 no murmurs, no S3.  ABD: Soft non tender. Bowel sounds normal.  Ext: No edema  MS: Adequate ROM spine, shoulders, hips and knees.  Skin: Intact, no ulcerations or rash noted.  Psych: Good eye contact, normal affect. Memory intact not anxious or depressed appearing.  CNS: CN 2-12 intact, power, tone and sensation normal throughout.       Assessment & Plan:

## 2013-11-14 NOTE — Assessment & Plan Note (Signed)
No current flare uses meds as needed 

## 2013-11-14 NOTE — Assessment & Plan Note (Signed)
Unchanged with chronic hoarseness , reduce caffeine and chronic PPI

## 2013-11-14 NOTE — Assessment & Plan Note (Signed)
Controlled, no change in medication DASH diet and commitment to daily physical activity for a minimum of 30 minutes discussed and encouraged, as a part of hypertension management. The importance of attaining a healthy weight is also discussed.  

## 2013-11-14 NOTE — Assessment & Plan Note (Signed)
Reports intermittent flares, but essentially unchanged. No desire for ENT eval at this time, continue pPI

## 2013-11-14 NOTE — Assessment & Plan Note (Signed)
Improved Hyperlipidemia:Low fat diet discussed and encouraged.  No med change, lDL still slightly high

## 2014-04-24 LAB — LIPID PANEL
Cholesterol: 186 mg/dL (ref 0–200)
HDL: 49 mg/dL (ref >39)
LDL Cholesterol: 107 mg/dL — ABNORMAL HIGH (ref 0–99)
Total CHOL/HDL Ratio: 3.8 Ratio
Triglycerides: 149 mg/dL (ref <150)
VLDL: 30 mg/dL (ref 0–40)

## 2014-04-24 LAB — CBC WITH DIFFERENTIAL/PLATELET
Basophils Absolute: 0 10*3/uL (ref 0.0–0.1)
Basophils Relative: 1 % (ref 0–1)
Eosinophils Absolute: 0.3 10*3/uL (ref 0.0–0.7)
Eosinophils Relative: 5 % (ref 0–5)
HCT: 40.5 % (ref 39.0–52.0)
Hemoglobin: 13.7 g/dL (ref 13.0–17.0)
Lymphocytes Relative: 33 % (ref 12–46)
Lymphs Abs: 1.8 10*3/uL (ref 0.7–4.0)
MCH: 28.2 pg (ref 26.0–34.0)
MCHC: 33.8 g/dL (ref 30.0–36.0)
MCV: 83.5 fL (ref 78.0–100.0)
Monocytes Absolute: 0.5 10*3/uL (ref 0.1–1.0)
Monocytes Relative: 10 % (ref 3–12)
Neutro Abs: 2.8 10*3/uL (ref 1.7–7.7)
Neutrophils Relative %: 51 % (ref 43–77)
Platelets: 199 10*3/uL (ref 150–400)
RBC: 4.85 MIL/uL (ref 4.22–5.81)
RDW: 14.5 % (ref 11.5–15.5)
WBC: 5.4 10*3/uL (ref 4.0–10.5)

## 2014-04-24 LAB — COMPREHENSIVE METABOLIC PANEL
ALT: 18 U/L (ref 0–53)
AST: 21 U/L (ref 0–37)
Albumin: 4.5 g/dL (ref 3.5–5.2)
Alkaline Phosphatase: 56 U/L (ref 39–117)
BUN: 17 mg/dL (ref 6–23)
CO2: 28 mEq/L (ref 19–32)
Calcium: 9.3 mg/dL (ref 8.4–10.5)
Chloride: 105 mEq/L (ref 96–112)
Creat: 1.39 mg/dL — ABNORMAL HIGH (ref 0.50–1.35)
Glucose, Bld: 91 mg/dL (ref 70–99)
Potassium: 4.7 mEq/L (ref 3.5–5.3)
Sodium: 140 mEq/L (ref 135–145)
Total Bilirubin: 0.4 mg/dL (ref 0.2–1.2)
Total Protein: 6.7 g/dL (ref 6.0–8.3)

## 2014-04-24 LAB — HEMOGLOBIN A1C
Hgb A1c MFr Bld: 5.7 % — ABNORMAL HIGH (ref <5.7)
Mean Plasma Glucose: 117 mg/dL — ABNORMAL HIGH (ref <117)

## 2014-05-01 ENCOUNTER — Encounter: Payer: Self-pay | Admitting: Family Medicine

## 2014-05-01 ENCOUNTER — Ambulatory Visit (INDEPENDENT_AMBULATORY_CARE_PROVIDER_SITE_OTHER): Payer: Medicare Other | Admitting: Family Medicine

## 2014-05-01 VITALS — BP 130/82 | HR 67 | Resp 16 | Ht 69.0 in | Wt 206.0 lb

## 2014-05-01 DIAGNOSIS — Z23 Encounter for immunization: Secondary | ICD-10-CM

## 2014-05-01 DIAGNOSIS — I10 Essential (primary) hypertension: Secondary | ICD-10-CM

## 2014-05-01 DIAGNOSIS — Z0000 Encounter for general adult medical examination without abnormal findings: Secondary | ICD-10-CM

## 2014-05-01 DIAGNOSIS — E785 Hyperlipidemia, unspecified: Secondary | ICD-10-CM

## 2014-05-01 DIAGNOSIS — R7301 Impaired fasting glucose: Secondary | ICD-10-CM

## 2014-05-01 NOTE — Progress Notes (Signed)
Subjective:    Patient ID: [name] [name], male    DOB: 1941-01-20, 73 y.o.   MRN: [id number]  HPI Preventive Screening-Counseling & Management   Patient present here today for a Medicare annual wellness visit.   Current Problems (verified)   Medications Prior to Visit Allergies (verified)   PAST HISTORY  Family History: documented  Social History Married father of one daughter, past h/o nicotine use, never alcohol or street drug dependence   Risk Factors  Current exercise habits:  30 mins daily, good, continue same  Dietary issues discussed:heart heal;thy diet , decreased carb intake   Cardiac risk factors: metabolic syndrome  Depression Screen  (Note: if answer to either of the following is "Yes", a more complete depression screening is indicated)   Over the past two weeks, have you felt down, depressed or hopeless? No  Over the past two weeks, have you felt little interest or pleasure in doing things? No  Have you lost interest or pleasure in daily life? No  Do you often feel hopeless? No  Do you cry easily over simple problems? No   Activities of Daily Living  In your present state of health, do you have any difficulty performing the following activities?  Driving?: No Managing money?: No Feeding yourself?:No Getting from bed to chair?:No Climbing a flight of stairs?:No Preparing food and eating?:No Bathing or showering?:No Getting dressed?:No Getting to the toilet?:No Using the toilet?:No Moving around from place to place?: No  Fall Risk Assessment In the past year have you fallen or had a near fall?:No Are you currently taking any medications that make you dizzy,?:No   Hearing Difficulties: No Do you often ask people to speak up or repeat themselves?:No Do you experience ringing or noises in your ears?:No Do you have difficulty understanding soft or whispered voices?:No  Cognitive Testing  Alert? Yes Normal Appearance?Yes  Oriented to person?  Yes Place? Yes  Time? Yes  Displays appropriate judgment?Yes  Can read the correct time from a watch face? yes Are you having problems remembering things?No  Advanced Directives have been discussed with the patient?Yes , will re address his living will   List the Names of Other Physician/Practitioners you currently use:    Indicate any recent Medical Services you may have received from other than [facility] providers in the past year (date may be approximate).   Assessment:    Annual Wellness Exam   Plan:    During the course of the visit the patient was educated and counseled about appropriate screening and preventive services including:  A healthy diet is rich in fruit, vegetables and whole grains. Poultry fish, nuts and beans are a healthy choice for protein rather then red meat. A low sodium diet and drinking 64 ounces of water daily is generally recommended. Oils and sweet should be limited. Carbohydrates especially for those who are diabetic or overweight, should be limited to 30-45 gram per meal. It is important to eat on a regular schedule, at least 3 times daily. Snacks should be primarily fruits, vegetables or nuts. It is important that you exercise regularly at least 30 minutes 5 times a week. If you develop chest pain, have severe difficulty breathing, or feel very tired, stop exercising immediately and seek medical attention  Immunization reviewed and updated. Cancer screening reviewed and updated    Patient Instructions (the written plan) was given to the patient.  Medicare Attestation  I have personally reviewed:  The patient's medical and social history  Their use of alcohol, tobacco or illicit drugs  Their current medications and supplements  The patient's functional ability including ADLs,fall risks, home safety risks, cognitive, and hearing and visual impairment  Diet and physical activities  Evidence for depression or mood disorders  The patient's weight, height,  BMI, and visual acuity have been recorded in the chart. I have made referrals, counseling, and provided education to the patient based on review of the above and I have provided the patient with a written personalized care plan for preventive services.      Review of Systems     Objective:   Physical Exam        Assessment & Plan:  Medicare annual wellness visit, subsequent Annual exam as documented. Counseling done  re healthy lifestyle involving commitment to 150 minutes exercise per week, heart healthy diet, and attaining healthy weight.The importance of adequate sleep also discussed. Regular seat belt use and safe storage  of firearms if patient has them, is also discussed. Changes in health habits are decided on by the patient with goals and time frames  set for achieving them. Immunization and cancer screening needs are specifically addressed at this visit.   Need for vaccination for Strep pneumoniae Vaccine administered at visit

## 2014-05-01 NOTE — Patient Instructions (Addendum)
F/u in December, pls call for flu vaccine in September.Call if you need me before  Prevnar today  No changes in your medications  Pls reduce sugar, carbs and fat in your diet, your numbers have increased slightly, increasing your heart disease risk  HBa1C, fasting lipid, cmp in December   Keep active and keep busy, and stay out of trouble!

## 2014-05-08 ENCOUNTER — Other Ambulatory Visit: Payer: Self-pay

## 2014-05-08 DIAGNOSIS — I10 Essential (primary) hypertension: Secondary | ICD-10-CM

## 2014-05-08 MED ORDER — TERAZOSIN HCL 2 MG PO CAPS: 2.0000 mg | ORAL_CAPSULE | Freq: Every day | ORAL | Status: DC

## 2014-05-08 MED ORDER — OMEPRAZOLE 20 MG PO CPDR: 20.0000 mg | DELAYED_RELEASE_CAPSULE | Freq: Every morning | ORAL | Status: DC

## 2014-05-08 MED ORDER — SIMVASTATIN 40 MG PO TABS: ORAL_TABLET | ORAL | Status: DC

## 2014-05-13 ENCOUNTER — Encounter: Payer: Self-pay | Admitting: Family Medicine

## 2014-05-13 DIAGNOSIS — Z23 Encounter for immunization: Secondary | ICD-10-CM | POA: Insufficient documentation

## 2014-05-13 NOTE — Assessment & Plan Note (Signed)
Annual exam as documented. Counseling done  re healthy lifestyle involving commitment to 150 minutes exercise per week, heart healthy diet, and attaining healthy weight.The importance of adequate sleep also discussed. Regular seat belt use and safe storage  of firearms if patient has them, is also discussed. Changes in health habits are decided on by the patient with goals and time frames  set for achieving them. Immunization and cancer screening needs are specifically addressed at this visit.  

## 2014-05-13 NOTE — Assessment & Plan Note (Signed)
Vaccine administered at visit.  

## 2014-07-27 ENCOUNTER — Ambulatory Visit (INDEPENDENT_AMBULATORY_CARE_PROVIDER_SITE_OTHER): Payer: Medicare Other

## 2014-07-27 DIAGNOSIS — Z23 Encounter for immunization: Secondary | ICD-10-CM

## 2014-09-19 ENCOUNTER — Encounter: Payer: Self-pay | Admitting: Family Medicine

## 2014-09-19 LAB — COMPREHENSIVE METABOLIC PANEL
ALT: 19 U/L (ref 0–53)
AST: 22 U/L (ref 0–37)
Albumin: 4.3 g/dL (ref 3.5–5.2)
Alkaline Phosphatase: 55 U/L (ref 39–117)
BUN: 16 mg/dL (ref 6–23)
CO2: 28 mEq/L (ref 19–32)
Calcium: 8.8 mg/dL (ref 8.4–10.5)
Chloride: 107 mEq/L (ref 96–112)
Creat: 1.47 mg/dL — ABNORMAL HIGH (ref 0.50–1.35)
Glucose, Bld: 89 mg/dL (ref 70–99)
Potassium: 4.6 mEq/L (ref 3.5–5.3)
Sodium: 142 mEq/L (ref 135–145)
Total Bilirubin: 0.4 mg/dL (ref 0.2–1.2)
Total Protein: 6.5 g/dL (ref 6.0–8.3)

## 2014-09-19 LAB — LIPID PANEL
Cholesterol: 180 mg/dL (ref 0–200)
HDL: 52 mg/dL (ref >39)
LDL Cholesterol: 103 mg/dL — ABNORMAL HIGH (ref 0–99)
Total CHOL/HDL Ratio: 3.5 Ratio
Triglycerides: 126 mg/dL (ref <150)
VLDL: 25 mg/dL (ref 0–40)

## 2014-09-19 LAB — HEMOGLOBIN A1C
Hgb A1c MFr Bld: 5.7 % — ABNORMAL HIGH (ref <5.7)
Mean Plasma Glucose: 117 mg/dL — ABNORMAL HIGH (ref <117)

## 2014-09-25 ENCOUNTER — Ambulatory Visit (INDEPENDENT_AMBULATORY_CARE_PROVIDER_SITE_OTHER): Payer: Medicare Other | Admitting: Family Medicine

## 2014-09-25 ENCOUNTER — Encounter: Payer: Self-pay | Admitting: Family Medicine

## 2014-09-25 VITALS — BP 140/90 | HR 79 | Resp 16 | Ht 69.0 in | Wt 213.0 lb

## 2014-09-25 DIAGNOSIS — Z1211 Encounter for screening for malignant neoplasm of colon: Secondary | ICD-10-CM

## 2014-09-25 DIAGNOSIS — I10 Essential (primary) hypertension: Secondary | ICD-10-CM

## 2014-09-25 DIAGNOSIS — K219 Gastro-esophageal reflux disease without esophagitis: Secondary | ICD-10-CM

## 2014-09-25 DIAGNOSIS — R519 Headache, unspecified: Secondary | ICD-10-CM | POA: Insufficient documentation

## 2014-09-25 DIAGNOSIS — E669 Obesity, unspecified: Secondary | ICD-10-CM

## 2014-09-25 DIAGNOSIS — R51 Headache: Secondary | ICD-10-CM

## 2014-09-25 DIAGNOSIS — R7302 Impaired glucose tolerance (oral): Secondary | ICD-10-CM

## 2014-09-25 DIAGNOSIS — Z125 Encounter for screening for malignant neoplasm of prostate: Secondary | ICD-10-CM

## 2014-09-25 DIAGNOSIS — E785 Hyperlipidemia, unspecified: Secondary | ICD-10-CM

## 2014-09-25 DIAGNOSIS — E66811 Obesity, class 1: Secondary | ICD-10-CM

## 2014-09-25 LAB — POC HEMOCCULT BLD/STL (OFFICE/1-CARD/DIAGNOSTIC): Fecal Occult Blood, POC: NEGATIVE

## 2014-09-25 MED ORDER — SIMVASTATIN 40 MG PO TABS: ORAL_TABLET | ORAL | Status: DC

## 2014-09-25 MED ORDER — OMEPRAZOLE 20 MG PO CPDR: 20.0000 mg | DELAYED_RELEASE_CAPSULE | Freq: Every morning | ORAL | Status: DC

## 2014-09-25 MED ORDER — BUTALBITAL-APAP-CAFFEINE 50-500-40 MG PO TABS: ORAL_TABLET | ORAL | Status: DC

## 2014-09-25 NOTE — Progress Notes (Signed)
Subjective:    Patient ID: [name] [name], male    DOB: 1941/07/11, 73 y.o.   MRN: [id number]  HPI The PT is here for follow up and re-evaluation of chronic medical conditions, medication management and review of any available recent lab and radiology data.  Preventive health is updated, specifically  Cancer screening and Immunization.    The PT denies any adverse reactions to current medications since the last visit.  4 week h/o frequent severe headaches which are disabling, most recent was 1 day ago, denies any new or associated neurologic deficits, past h/o migraines and the current headaches are similar, wants to monitor furhter does not want imaging strudy despite my advice to do so since the headaches have recently returned    Review of Systems See HPI Denies recent fever or chills. Denies sinus pressure, nasal congestion, ear pain or sore throat. Denies chest congestion, productive cough or wheezing. Denies chest pains, palpitations and leg swelling Denies abdominal pain, nausea, vomiting,diarrhea or constipation.   Denies dysuria, frequency, hesitancy or incontinence. Denies joint pain, swelling and limitation in mobility. Denies , seizures, numbness, or tingling. Denies depression, anxiety or insomnia. Denies skin break down or rash.        Objective:   Physical Exam  BP 140/90 mmHg  Pulse 79  Resp 16  Ht 5\' 9"  (1.753 m)  Wt 213 lb (96.616 kg)  BMI 31.44 kg/m2  SpO2 96% Patient alert and oriented and in no cardiopulmonary distress.  HEENT: No facial asymmetry, EOMI,   oropharynx pink and moist.  Neck supple no JVD, no mass. Fundoscopy: no hemorhage, No sinus tenderness Chest: Clear to auscultation bilaterally.  CVS: S1, S2 no murmurs, no S3.Regular rate.  ABD: Soft non tender. No organomegaly or mass, normal bS Rectal: no mass, heme negative stool, prostate is  smooth and firm  Ext: No edema  MS: Adequate ROM spine, shoulders, hips and knees.  Skin:  Intact, no ulcerations or rash noted.  Psych: Good eye contact, normal affect. Memory intact not anxious or depressed appearing.  CNS: CN 2-12 intact, power,  normal throughout.no focal deficits noted.       Assessment & Plan:  Essential hypertension Elevated today, still recovering from headache the day before, has had normal bp in the past No med change today DASH diet and commitment to daily physical activity for a minimum of 30 minutes discussed and encouraged, as a part of hypertension management. The importance of attaining a healthy weight is also discussed.   Frequent headaches Increased frequency and severity of headache in the past approx 4 weeks, associated with nausea, disabling H/o migraines in the past was seen by neurologist years ago. Denies any associated neurologic deficits with these headaches which seem to be recurrence of the migraine He will have access to fioricet  For relief, and is advised that if headaches persist or worsen as far as severity or frequency is concerned he needs imaging, this is recommended at [facility] visit , but he chooses to defer and continue to watch Normal neuro exam at visit today  Hyperlipemia Improved, lDL still mildly elevated Hyperlipidemia:Low fat diet discussed and encouraged.    Special screening for malignant neoplasms, colon Rectal : no mass and heme negative stool  GERD (gastroesophageal reflux disease) Controlled, no change in medication   IGT (impaired glucose tolerance) unchanged Patient educated about the importance of limiting  Carbohydrate intake , the need to commit to daily physical activity for a minimum of 30  minutes , and to commit weight loss. The fact that changes in all these areas will reduce or eliminate all together the development of diabetes is stressed.     Obesity (BMI 30.0-34.9) Deteriorated. Patient re-educated about  the importance of commitment to a  minimum of 150 minutes of exercise per  week. The importance of healthy food choices with portion control discussed. Encouraged to start a food diary, count calories and to consider  joining a support group. Sample diet sheets offered. Goals set by the patient for the next several months.

## 2014-09-25 NOTE — Patient Instructions (Addendum)
F/u in 5 month, call if you need me before  Pls reduce salt intake, increase fresh fruit and veg, commit to daily exercise and weight loss to help blood pressure  Blood pressure elevated at this visit  New for headachesis fioricet one tablet  Up to 4 tablets in 24 hours for headache, pls call if headaches worsen in frequency or severity  Rectal exam today is normal  CBc, fasting lipid, cmp, HBA1C , TSH and OSA in 5 month  Blood sugar still a bit high, bad cholesterol has improved a bit, need to watch kidney function , and  Use only tylenol for pain, and work on getting blood pressure lower pls

## 2014-09-28 ENCOUNTER — Other Ambulatory Visit: Payer: Self-pay

## 2014-09-28 DIAGNOSIS — R519 Headache, unspecified: Secondary | ICD-10-CM

## 2014-09-28 DIAGNOSIS — R51 Headache: Principal | ICD-10-CM

## 2014-09-28 MED ORDER — BUTALBITAL-APAP-CAFFEINE 50-325-40 MG PO TABS: 1.0000 | ORAL_TABLET | Freq: Four times a day (QID) | ORAL | Status: DC | PRN

## 2014-10-01 DIAGNOSIS — Z1211 Encounter for screening for malignant neoplasm of colon: Secondary | ICD-10-CM | POA: Insufficient documentation

## 2014-10-01 DIAGNOSIS — R7302 Impaired glucose tolerance (oral): Secondary | ICD-10-CM | POA: Insufficient documentation

## 2014-10-01 NOTE — Assessment & Plan Note (Signed)
Improved, lDL still mildly elevated Hyperlipidemia:Low fat diet discussed and encouraged.

## 2014-10-01 NOTE — Assessment & Plan Note (Signed)
Deteriorated. Patient re-educated about  the importance of commitment to a  minimum of 150 minutes of exercise per week. The importance of healthy food choices with portion control discussed. Encouraged to start a food diary, count calories and to consider  joining a support group. Sample diet sheets offered. Goals set by the patient for the next several months.    

## 2014-10-01 NOTE — Assessment & Plan Note (Signed)
Increased frequency and severity of headache in the past approx 4 weeks, associated with nausea, disabling H/o migraines in the past was seen by neurologist years ago. Denies any associated neurologic deficits with these headaches which seem to be recurrence of the migraine He will have access to fioricet  For relief, and is advised that if headaches persist or worsen as far as severity or frequency is concerned he needs imaging, this is recommended at thsi visit , but he chooses to defer and continue to watch Normal neuro exam at visit today

## 2014-10-01 NOTE — Assessment & Plan Note (Signed)
Elevated today, still recovering from headache the day before, has had normal bp in the past No med change today DASH diet and commitment to daily physical activity for a minimum of 30 minutes discussed and encouraged, as a part of hypertension management. The importance of attaining a healthy weight is also discussed.

## 2014-10-01 NOTE — Assessment & Plan Note (Signed)
unchanged Patient educated about the importance of limiting  Carbohydrate intake , the need to commit to daily physical activity for a minimum of 30 minutes , and to commit weight loss. The fact that changes in all these areas will reduce or eliminate all together the development of diabetes is stressed.    

## 2014-10-01 NOTE — Assessment & Plan Note (Signed)
Controlled, no change in medication  

## 2014-10-01 NOTE — Assessment & Plan Note (Addendum)
Rectal : no mass and heme negative stool

## 2014-10-03 ENCOUNTER — Telehealth: Payer: Self-pay | Admitting: Family Medicine

## 2014-10-03 NOTE — Telephone Encounter (Signed)
Was handled on phone

## 2014-10-18 ENCOUNTER — Other Ambulatory Visit: Payer: Self-pay

## 2014-10-18 MED ORDER — BUTALBITAL-APAP-CAFFEINE 50-325-40 MG PO TABS: 1.0000 | ORAL_TABLET | Freq: Four times a day (QID) | ORAL | Status: DC | PRN

## 2015-02-14 ENCOUNTER — Telehealth: Payer: Self-pay | Admitting: Family Medicine

## 2015-02-14 ENCOUNTER — Other Ambulatory Visit: Payer: Self-pay

## 2015-02-14 DIAGNOSIS — I10 Essential (primary) hypertension: Secondary | ICD-10-CM

## 2015-02-14 MED ORDER — TERAZOSIN HCL 2 MG PO CAPS: 2.0000 mg | ORAL_CAPSULE | Freq: Every day | ORAL | Status: DC

## 2015-02-14 MED ORDER — SIMVASTATIN 40 MG PO TABS: ORAL_TABLET | ORAL | Status: DC

## 2015-02-15 MED ORDER — SIMVASTATIN 40 MG PO TABS: ORAL_TABLET | ORAL | Status: DC

## 2015-02-15 MED ORDER — TERAZOSIN HCL 2 MG PO CAPS
2.0000 mg | ORAL_CAPSULE | Freq: Every day | ORAL | Status: DC
Start: 2015-02-15 — End: 2016-05-09

## 2015-02-15 NOTE — Telephone Encounter (Signed)
meds sent

## 2015-02-23 ENCOUNTER — Ambulatory Visit: Payer: Medicare Other | Admitting: Family Medicine

## 2015-03-28 ENCOUNTER — Other Ambulatory Visit: Payer: Self-pay | Admitting: Family Medicine

## 2015-03-28 LAB — CBC
HCT: 41.5 % (ref 39.0–52.0)
Hemoglobin: 13.8 g/dL (ref 13.0–17.0)
MCH: 28.6 pg (ref 26.0–34.0)
MCHC: 33.3 g/dL (ref 30.0–36.0)
MCV: 85.9 fL (ref 78.0–100.0)
MPV: 10.5 fL (ref 8.6–12.4)
Platelets: 203 10*3/uL (ref 150–400)
RBC: 4.83 MIL/uL (ref 4.22–5.81)
RDW: 14.4 % (ref 11.5–15.5)
WBC: 5.4 10*3/uL (ref 4.0–10.5)

## 2015-03-28 LAB — COMPREHENSIVE METABOLIC PANEL
ALT: 20 U/L (ref 0–53)
AST: 22 U/L (ref 0–37)
Albumin: 4.3 g/dL (ref 3.5–5.2)
Alkaline Phosphatase: 51 U/L (ref 39–117)
BUN: 14 mg/dL (ref 6–23)
CO2: 26 mEq/L (ref 19–32)
Calcium: 9.2 mg/dL (ref 8.4–10.5)
Chloride: 107 mEq/L (ref 96–112)
Creat: 1.44 mg/dL — ABNORMAL HIGH (ref 0.50–1.35)
Glucose, Bld: 92 mg/dL (ref 70–99)
Potassium: 4.4 mEq/L (ref 3.5–5.3)
Sodium: 141 mEq/L (ref 135–145)
Total Bilirubin: 0.4 mg/dL (ref 0.2–1.2)
Total Protein: 6.4 g/dL (ref 6.0–8.3)

## 2015-03-28 LAB — LIPID PANEL
Cholesterol: 177 mg/dL (ref 0–200)
HDL: 48 mg/dL (ref >=40)
LDL Cholesterol: 103 mg/dL — ABNORMAL HIGH (ref 0–99)
Total CHOL/HDL Ratio: 3.7 Ratio
Triglycerides: 130 mg/dL (ref <150)
VLDL: 26 mg/dL (ref 0–40)

## 2015-03-28 LAB — TSH: TSH: 2.954 u[IU]/mL (ref 0.350–4.500)

## 2015-03-29 LAB — HEMOGLOBIN A1C
Hgb A1c MFr Bld: 5.6 % (ref <5.7)
Mean Plasma Glucose: 114 mg/dL (ref <117)

## 2015-03-29 LAB — PSA, MEDICARE: PSA: 0.75 ng/mL (ref <=4.00)

## 2015-04-12 ENCOUNTER — Encounter: Payer: Self-pay | Admitting: Family Medicine

## 2015-04-12 ENCOUNTER — Ambulatory Visit (INDEPENDENT_AMBULATORY_CARE_PROVIDER_SITE_OTHER): Payer: PPO | Admitting: Family Medicine

## 2015-04-12 VITALS — BP 138/84 | HR 77 | Resp 16 | Ht 69.0 in | Wt 209.0 lb

## 2015-04-12 DIAGNOSIS — I10 Essential (primary) hypertension: Secondary | ICD-10-CM | POA: Diagnosis not present

## 2015-04-12 DIAGNOSIS — E785 Hyperlipidemia, unspecified: Secondary | ICD-10-CM

## 2015-04-12 DIAGNOSIS — E669 Obesity, unspecified: Secondary | ICD-10-CM

## 2015-04-12 DIAGNOSIS — R519 Headache, unspecified: Secondary | ICD-10-CM

## 2015-04-12 DIAGNOSIS — R490 Dysphonia: Secondary | ICD-10-CM

## 2015-04-12 DIAGNOSIS — K219 Gastro-esophageal reflux disease without esophagitis: Secondary | ICD-10-CM | POA: Diagnosis not present

## 2015-04-12 DIAGNOSIS — R51 Headache: Secondary | ICD-10-CM

## 2015-04-12 DIAGNOSIS — J3089 Other allergic rhinitis: Secondary | ICD-10-CM

## 2015-04-12 DIAGNOSIS — R930 Abnormal findings on diagnostic imaging of skull and head, not elsewhere classified: Secondary | ICD-10-CM

## 2015-04-12 DIAGNOSIS — R7302 Impaired glucose tolerance (oral): Secondary | ICD-10-CM

## 2015-04-12 NOTE — Patient Instructions (Signed)
Annual wellness in 5 month, call if t you need me before  CONGRATS, blood sugar is normal and you have lost 4 pounds, keep it up!  Please work on good  health habits so that your health will improve. 1. Commitment to daily physical activity for 30 to 60  minutes, if you are able to do this.  2. Commitment to wise food choices. Aim for half of your  food intake to be vegetable and fruit, one quarter starchy foods, and one quarter protein. Try to eat on a regular schedule  3 meals per day, snacking between meals should be limited to vegetables or fruits or small portions of nuts. 64 ounces of water per day is generally recommended, unless you have specific health conditions, like heart failure or kidney failure where you will need to limit fluid intake.  3. Commitment to sufficient and a  good quality of physical and mental rest daily, generally between 6 to 8 hours per day.  WITH PERSISTANCE AND PERSEVERANCE, THE IMPOSSIBLE , BECOMES THE NORM!   Thanks for choosing [facility], we consider it a privelige to serve you. Fasting lipid and hepatic in 5 months  .p

## 2015-04-14 NOTE — Assessment & Plan Note (Signed)
Controlled, no change in medication DASH diet and commitment to daily physical activity for a minimum of 30 minutes discussed and encouraged, as a part of hypertension management. The importance of attaining a healthy weight is also discussed.  BP/Weight 04/12/2015 09/25/2014 05/01/2014 10/31/2013 04/29/2013 6/38/9373 01/20/8767  Systolic BP 115 726 203 559 741 638 453  Diastolic BP 84 90 82 82 80 80 82  Wt. (Lbs) 209 213 206 214.12 210.4 210 207  BMI 30.85 31.44 30.41 31.61 31.06 31 30.55

## 2015-04-14 NOTE — Assessment & Plan Note (Signed)
Patient educated about the importance of limiting  Carbohydrate intake , the need to commit to daily physical activity for a minimum of 30 minutes , and to commit weight loss. The fact that changes in all these areas will reduce or eliminate all together the development of diabetes is stressed.  Currently corrected , which is excellent, pt applauded on this Diabetic Labs Latest Ref Rng 03/28/2015 09/19/2014 04/24/2014 10/31/2013 04/19/2013  HbA1c <5.7 % 5.6 5.7(H) 5.7(H) - -  Chol 0 - 200 mg/dL 177 180 186 175 200  HDL >=40 mg/dL 48 52 49 48 51  Calc LDL 0 - 99 mg/dL 103(H) 103(H) 107(H) 101(H) 113(H)  Triglycerides <150 mg/dL 130 126 149 128 181(H)  Creatinine 0.50 - 1.35 mg/dL 1.44(H) 1.47(H) 1.39(H) 1.40(H) 1.40(H)   BP/Weight 04/12/2015 09/25/2014 05/01/2014 10/31/2013 04/29/2013 1/70/0174 06/23/4966  Systolic BP 591 638 466 599 357 017 793  Diastolic BP 84 90 82 82 80 80 82  Wt. (Lbs) 209 213 206 214.12 210.4 210 207  BMI 30.85 31.44 30.41 31.61 31.06 31 30.55   No flowsheet data found.

## 2015-04-14 NOTE — Assessment & Plan Note (Signed)
Improved, however , needs to improve LDL furhter Hyperlipidemia:Low fat diet discussed and encouraged.   Lipid Panel  Lab Results  Component Value Date   CHOL 177 03/28/2015   HDL 48 03/28/2015   LDLCALC 103* 03/28/2015   TRIG 130 03/28/2015   CHOLHDL 3.7 03/28/2015

## 2015-04-14 NOTE — Assessment & Plan Note (Signed)
Denies cough, hemoptysis, dyspnea, no concerning weight loss, no re imaging at this time

## 2015-04-14 NOTE — Assessment & Plan Note (Signed)
Controlled, no change in medication  

## 2015-04-14 NOTE — Assessment & Plan Note (Signed)
ImprovedPatient re-educated about  the importance of commitment to a  minimum of 150 minutes of exercise per week.  The importance of healthy food choices with portion control discussed. Encouraged to start a food diary, count calories and to consider  joining a support group. Sample diet sheets offered. Goals set by the patient for the next several months.   Weight /BMI 04/12/2015 09/25/2014 05/01/2014  WEIGHT 209 lb 213 lb 206 lb  HEIGHT 5\' 9"  5\' 9"  5\' 9"   BMI 30.85 kg/m2 31.44 kg/m2 30.41 kg/m2    Current exercise per week 90 minutes.

## 2015-04-14 NOTE — Assessment & Plan Note (Signed)
Controlled, no change in medication Improvement in hoarseness with regular use of medication

## 2015-04-14 NOTE — Assessment & Plan Note (Addendum)
Reports no headache since last seen requiring use of any med other than OTC med, less frequent and severe , probably 3 mild headaches in past 6 month Has not used fioricet as yet, neurologic exam is normal

## 2015-04-14 NOTE — Progress Notes (Signed)
[name] [name]     MRN: [id number]      DOB: 1941-05-28   HPI Mr. Ullman is here for follow up and re-evaluation of chronic medical conditions, medication management and review of any available recent lab and radiology data.  Preventive health is updated, specifically  Cancer screening and Immunization.   Questions or concerns regarding consultations or procedures which the PT has had in the interim are  addressed. The PT denies any adverse reactions to current medications since the last visit.  There are no new concerns.  There are no specific complaints   ROS Denies recent fever or chills. Denies sinus pressure, nasal congestion, ear pain or sore throat. Denies chest congestion, productive cough or wheezing. Denies chest pains, palpitations and leg swelling Denies abdominal pain, nausea, vomiting,diarrhea or constipation.   Denies dysuria, frequency, hesitancy or incontinence. Denies joint pain, swelling and limitation in mobility. Denies headaches, seizures, numbness, or tingling. Denies depression, anxiety or insomnia. Denies skin break down or rash.   PE  BP 138/84 mmHg  Pulse 77  Resp 16  Ht 5\' 9"  (1.753 m)  Wt 209 lb (94.802 kg)  BMI 30.85 kg/m2  SpO2 96%  Patient alert and oriented and in no cardiopulmonary distress.  HEENT: No facial asymmetry, EOMI,   oropharynx pink and moist.  Neck supple no JVD, no mass.  Chest: Clear to auscultation bilaterally.  CVS: S1, S2 no murmurs, no S3.Regular rate.  ABD: Soft non tender.   Ext: No edema  MS: Adequate ROM spine, shoulders, hips and knees.  Skin: Intact, no ulcerations or rash noted.  Psych: Good eye contact, normal affect. Memory intact not anxious or depressed appearing.  CNS: CN 2-12 intact, power,  normal throughout.no focal deficits noted.   Assessment & Plan   Essential hypertension Controlled, no change in medication DASH diet and commitment to daily physical activity for a minimum of 30  minutes discussed and encouraged, as a part of hypertension management. The importance of attaining a healthy weight is also discussed.  BP/Weight 04/12/2015 09/25/2014 05/01/2014 10/31/2013 04/29/2013 11/09/9756 05/22/2548  Systolic BP 826 415 830 940 768 088 110  Diastolic BP 84 90 82 82 80 80 82  Wt. (Lbs) 209 213 206 214.12 210.4 210 207  BMI 30.85 31.44 30.41 31.61 31.06 31 30.55        Hyperlipemia Improved, however , needs to improve LDL furhter Hyperlipidemia:Low fat diet discussed and encouraged.   Lipid Panel  Lab Results  Component Value Date   CHOL 177 03/28/2015   HDL 48 03/28/2015   LDLCALC 103* 03/28/2015   TRIG 130 03/28/2015   CHOLHDL 3.7 03/28/2015        IGT (impaired glucose tolerance) Patient educated about the importance of limiting  Carbohydrate intake , the need to commit to daily physical activity for a minimum of 30 minutes , and to commit weight loss. The fact that changes in all these areas will reduce or eliminate all together the development of diabetes is stressed.  Currently corrected , which is excellent, pt applauded on this Diabetic Labs Latest Ref Rng 03/28/2015 09/19/2014 04/24/2014 10/31/2013 04/19/2013  HbA1c <5.7 % 5.6 5.7(H) 5.7(H) - -  Chol 0 - 200 mg/dL 177 180 186 175 200  HDL >=40 mg/dL 48 52 49 48 51  Calc LDL 0 - 99 mg/dL 103(H) 103(H) 107(H) 101(H) 113(H)  Triglycerides <150 mg/dL 130 126 149 128 181(H)  Creatinine 0.50 - 1.35 mg/dL 1.44(H) 1.47(H) 1.39(H) 1.40(H) 1.40(H)  BP/Weight 04/12/2015 09/25/2014 05/01/2014 10/31/2013 04/29/2013 03/06/6159 04/21/7105  Systolic BP 269 485 462 703 500 938 182  Diastolic BP 84 90 82 82 80 80 82  Wt. (Lbs) 209 213 206 214.12 210.4 210 207  BMI 30.85 31.44 30.41 31.61 31.06 31 30.55   No flowsheet data found.     GERD (gastroesophageal reflux disease) Controlled, no change in medication Improvement in hoarseness with regular use of medication  Hoarseness, chronic Marked improvement, he will  continue daily GERD med, and also claritin  Frequent headaches Reports no headache since last seen requiring use of any med other than OTC med, less frequent and severe , probably 3 mild headaches in past 6 month Has not used fioricet as yet, neurologic exam is normal  Obesity (BMI 30.0-34.9) ImprovedPatient re-educated about  the importance of commitment to a  minimum of 150 minutes of exercise per week.  The importance of healthy food choices with portion control discussed. Encouraged to start a food diary, count calories and to consider  joining a support group. Sample diet sheets offered. Goals set by the patient for the next several months.   Weight /BMI 04/12/2015 09/25/2014 05/01/2014  WEIGHT 209 lb 213 lb 206 lb  HEIGHT 5\' 9"  5\' 9"  5\' 9"   BMI 30.85 kg/m2 31.44 kg/m2 30.41 kg/m2    Current exercise per week 90 minutes.   Allergic rhinitis Controlled, no change in medication   Nonspecific abnormal findings on radiological and other examination of skull and head Denies cough, hemoptysis, dyspnea, no concerning weight loss, no re imaging at this time

## 2015-04-14 NOTE — Assessment & Plan Note (Signed)
Marked improvement, he will continue daily GERD med, and also claritin

## 2015-05-07 ENCOUNTER — Telehealth: Payer: Self-pay | Admitting: *Deleted

## 2015-05-07 MED ORDER — SIMVASTATIN 40 MG PO TABS: ORAL_TABLET | ORAL | Status: DC

## 2015-05-07 MED ORDER — OMEPRAZOLE 20 MG PO CPDR: 20.0000 mg | DELAYED_RELEASE_CAPSULE | Freq: Every morning | ORAL | Status: DC

## 2015-05-07 NOTE — Telephone Encounter (Signed)
meds refilled 

## 2015-05-07 NOTE — Telephone Encounter (Signed)
Pt LMOM requesting omeprazole and simvastatin to be refilled. Please advise

## 2015-05-08 ENCOUNTER — Other Ambulatory Visit: Payer: Self-pay

## 2015-05-08 MED ORDER — SIMVASTATIN 40 MG PO TABS: ORAL_TABLET | ORAL | Status: DC

## 2015-05-08 MED ORDER — OMEPRAZOLE 20 MG PO CPDR: 20.0000 mg | DELAYED_RELEASE_CAPSULE | Freq: Every morning | ORAL | Status: DC

## 2015-05-16 ENCOUNTER — Telehealth: Payer: Self-pay | Admitting: *Deleted

## 2015-05-16 DIAGNOSIS — I10 Essential (primary) hypertension: Secondary | ICD-10-CM

## 2015-05-16 MED ORDER — SIMVASTATIN 40 MG PO TABS: ORAL_TABLET | ORAL | Status: DC

## 2015-05-16 MED ORDER — TERAZOSIN HCL 2 MG PO CAPS: 2.0000 mg | ORAL_CAPSULE | Freq: Every day | ORAL | Status: DC

## 2015-05-16 MED ORDER — OMEPRAZOLE 20 MG PO CPDR: 20.0000 mg | DELAYED_RELEASE_CAPSULE | Freq: Every morning | ORAL | Status: DC

## 2015-05-16 NOTE — Telephone Encounter (Signed)
meds refilled 

## 2015-05-16 NOTE — Telephone Encounter (Signed)
Pt's wife called [facility] stating pt needs refills on his medications and he does not have prime mail anymore. Please advise

## 2015-08-07 ENCOUNTER — Ambulatory Visit (INDEPENDENT_AMBULATORY_CARE_PROVIDER_SITE_OTHER): Payer: PPO

## 2015-08-07 DIAGNOSIS — Z23 Encounter for immunization: Secondary | ICD-10-CM

## 2015-09-18 ENCOUNTER — Other Ambulatory Visit: Payer: Self-pay | Admitting: Family Medicine

## 2015-09-19 LAB — LIPID PANEL
Cholesterol: 189 mg/dL (ref 125–200)
HDL: 56 mg/dL (ref >=40)
LDL Cholesterol: 107 mg/dL (ref <130)
Total CHOL/HDL Ratio: 3.4 Ratio (ref <=5.0)
Triglycerides: 132 mg/dL (ref <150)
VLDL: 26 mg/dL (ref <30)

## 2015-09-19 LAB — HEPATIC FUNCTION PANEL
ALT: 19 U/L (ref 9–46)
AST: 24 U/L (ref 10–35)
Albumin: 4.1 g/dL (ref 3.6–5.1)
Alkaline Phosphatase: 47 U/L (ref 40–115)
Bilirubin, Direct: 0.1 mg/dL (ref <=0.2)
Indirect Bilirubin: 0.5 mg/dL (ref 0.2–1.2)
Total Bilirubin: 0.6 mg/dL (ref 0.2–1.2)
Total Protein: 6.6 g/dL (ref 6.1–8.1)

## 2015-09-21 LAB — BASIC METABOLIC PANEL
BUN: 14 mg/dL (ref 7–25)
CO2: 25 mmol/L (ref 20–31)
Calcium: 9.1 mg/dL (ref 8.6–10.3)
Chloride: 106 mmol/L (ref 98–110)
Creat: 1.36 mg/dL — ABNORMAL HIGH (ref 0.70–1.18)
Glucose, Bld: 84 mg/dL (ref 65–99)
Potassium: 4.2 mmol/L (ref 3.5–5.3)
Sodium: 141 mmol/L (ref 135–146)

## 2015-09-26 ENCOUNTER — Telehealth: Payer: Self-pay | Admitting: Family Medicine

## 2015-09-26 NOTE — Telephone Encounter (Signed)
Sinus Drainage, coughing, sore throat, hoarseness, denies fever, symptoms times 2 days, leaving for [place] for 2 wks and is asking for medication

## 2015-09-27 ENCOUNTER — Encounter: Payer: Self-pay | Admitting: Family Medicine

## 2015-09-27 ENCOUNTER — Ambulatory Visit (INDEPENDENT_AMBULATORY_CARE_PROVIDER_SITE_OTHER): Payer: PPO | Admitting: Family Medicine

## 2015-09-27 VITALS — BP 130/84 | HR 71 | Temp 98.5°F | Resp 16 | Ht 69.0 in | Wt 210.4 lb

## 2015-09-27 DIAGNOSIS — J302 Other seasonal allergic rhinitis: Secondary | ICD-10-CM | POA: Diagnosis not present

## 2015-09-27 DIAGNOSIS — E785 Hyperlipidemia, unspecified: Secondary | ICD-10-CM | POA: Diagnosis not present

## 2015-09-27 DIAGNOSIS — J3089 Other allergic rhinitis: Secondary | ICD-10-CM

## 2015-09-27 DIAGNOSIS — E669 Obesity, unspecified: Secondary | ICD-10-CM

## 2015-09-27 MED ORDER — FLUTICASONE PROPIONATE 50 MCG/ACT NA SUSP: 2.0000 | Freq: Every day | NASAL | Status: DC

## 2015-09-27 MED ORDER — PREDNISONE 5 MG PO TABS: 5.0000 mg | ORAL_TABLET | Freq: Two times a day (BID) | ORAL | Status: DC

## 2015-09-27 MED ORDER — AZITHROMYCIN 250 MG PO TABS: ORAL_TABLET | ORAL | Status: DC

## 2015-09-27 MED ORDER — METHYLPREDNISOLONE ACETATE 80 MG/ML IJ SUSP
80.0000 mg | Freq: Once | INTRAMUSCULAR | Status: AC
  Administered 2015-09-27: 80 mg via INTRAMUSCULAR

## 2015-09-27 NOTE — Patient Instructions (Signed)
F/U as before  You are treated today for uncontrolled allergy symptoms,  Use netty pot twice daily to clear nostrils/ sinuses  Depo medrol administered in office  5 day course of prednisone is prescribed, and flonase to be used correctly daily to control the allergies  Continue the daily allergy tablet  Since you will be away, I have sent in a 5 day antibiotic course of azithromycin, this is only to be used if you develop fever, chills, aches and green drainage or cough. You do NOT need an antibiotic at this time. You don NOT have an infection at this time. Now you only have uncontrolled allergy symptoms  Hope you feeel better soon  Thanks for choosing  Primary Care, we consider it a privelige to serve you.

## 2015-09-27 NOTE — Progress Notes (Signed)
   Subjective:    Patient ID: [name] [name], male    DOB: April 04, 1941, 74 y.o.   MRN: [id number]  HPI    Review of Systems     Objective:   Physical Exam        Assessment & Plan:

## 2015-09-27 NOTE — Telephone Encounter (Signed)
Patient seen in office today. 

## 2015-10-08 ENCOUNTER — Encounter: Payer: Self-pay | Admitting: Family Medicine

## 2015-10-08 ENCOUNTER — Ambulatory Visit (INDEPENDENT_AMBULATORY_CARE_PROVIDER_SITE_OTHER): Payer: PPO | Admitting: Family Medicine

## 2015-10-08 VITALS — BP 130/84 | HR 70 | Resp 16 | Ht 69.0 in | Wt 210.0 lb

## 2015-10-08 DIAGNOSIS — Z0000 Encounter for general adult medical examination without abnormal findings: Secondary | ICD-10-CM

## 2015-10-08 DIAGNOSIS — I10 Essential (primary) hypertension: Secondary | ICD-10-CM

## 2015-10-08 DIAGNOSIS — E785 Hyperlipidemia, unspecified: Secondary | ICD-10-CM

## 2015-10-08 NOTE — Patient Instructions (Addendum)
F/u in 5 months with rectal, call if you need me sooner  Continue  Great health habits  Fasting lipid and cmp in 5 months Please work on good  health habits so that your health will improve. 1. Commitment to daily physical activity for 30 to 60  minutes, if you are able to do this.  2. Commitment to wise food choices. Aim for half of your  food intake to be vegetable and fruit, one quarter starchy foods, and one quarter protein. Try to eat on a regular schedule  3 meals per day, snacking between meals should be limited to vegetables or fruits or small portions of nuts. 64 ounces of water per day is generally recommended, unless you have specific health conditions, like heart failure or kidney failure where you will need to limit fluid intake.  3. Commitment to sufficient and a  good quality of physical and mental rest daily, generally between 6 to 8 hours per day.  WITH PERSISTANCE AND PERSEVERANCE, THE IMPOSSIBLE , BECOMES THE NORM!  Thanks for choosing [facility], we consider it a privelige to serve you.

## 2015-10-08 NOTE — Assessment & Plan Note (Signed)

## 2015-10-08 NOTE — Progress Notes (Signed)
Subjective:    Patient ID: CEDAR NARDIELLO, male    DOB: 08/01/1941, 74 y.o.   MRN: [id number]  HPI Preventive Screening-Counseling & Management   Patient present here today for a Medicare annual wellness visit.   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 No  Over the past two weeks, have you felt little interest or pleasure in doing things? No  Have you lost interest or pleasure in daily life? No  Do you often feel hopeless? No  Do you cry easily over simple problems? No   Activities of Daily Living  In your present state of health, do you have any difficulty performing the following activities?  Driving?: No Managing money?: No Feeding yourself?:No Getting from bed to chair?:No Climbing a flight of stairs?:No Preparing food and eating?:No Bathing or showering?:No Getting dressed?:No Getting to the toilet?:No Using the toilet?:No Moving around from place to place?: No  Fall Risk Assessment In the past year have you fallen or had a near fall?:No Are you currently taking any medications that make you dizzy?:No   Hearing Difficulties: No Do you often ask people to speak up or repeat themselves?:No Do you experience ringing or noises in your ears?:No Do you have difficulty understanding soft or whispered voices?:No  Cognitive Testing  Alert? Yes Normal  Appearance?Yes  Oriented to person? Yes Place? Yes  Time? Yes  Displays appropriate judgment?Yes  Can read the correct time from a watch face? yes Are you having problems remembering things?No  Advanced Directives have been discussed with the patient?Yes, full code, living wills already documented   List the Names of Other Physician/Practitioners you currently use:  Dr Tarri Glenn (dermatology)   Indicate any recent Medical Services you may have received from other than [facility] providers in the past year (date may be approximate).   Assessment:    Annual Wellness Exam   Plan:   Medicare Attestation  I have personally reviewed:  The patient's medical and social history  Their use of alcohol, tobacco or illicit drugs  Their current medications and supplements  The patient's functional ability including ADLs,fall risks, home safety risks, cognitive, and hearing and visual impairment  Diet and physical activities  Evidence for depression or mood disorders  The patient's weight, height, BMI, and visual acuity have been recorded in the chart. I have made referrals, counseling, and provided education to the patient based on review of the above and I have provided the patient with a written personalized care plan for preventive services.      Review of Systems     Objective:   Physical Exam BP 130/84 mmHg  Pulse 70  Resp 16  Ht 5\' 9"  (1.753 m)  Wt 210 lb (95.255 kg)  BMI 31.00 kg/m2  SpO2 96%        Assessment & Plan:  Medicare annual wellness visit,  subsequent Annual exam as documented. Counseling done  re healthy lifestyle involving commitment to 150 minutes exercise per week, heart healthy diet, and attaining healthy weight.The importance of adequate sleep also discussed. Regular seat belt use and home safety, is also discussed. Changes in health habits are decided on by the patient with goals and time frames  set for achieving them. Immunization and cancer screening needs  are specifically addressed at this visit.

## 2015-10-29 NOTE — Assessment & Plan Note (Signed)
Deteriorated. Patient re-educated about  the importance of commitment to a  minimum of 150 minutes of exercise per week.  The importance of healthy food choices with portion control discussed. Encouraged to start a food diary, count calories and to consider  joining a support group. Sample diet sheets offered. Goals set by the patient for the next several months.   Weight /BMI 10/08/2015 09/27/2015 04/12/2015  WEIGHT 210 lb 210 lb 6.4 oz 209 lb  HEIGHT 5\' 9"  5\' 9"  5\' 9"   BMI 31 kg/m2 31.06 kg/m2 30.85 kg/m2    Current exercise per week 40 minutes.

## 2015-10-29 NOTE — Assessment & Plan Note (Addendum)
Uncontrolled , detailed plan of treatment to reduce symptoms reviewed with the patient, no need for antibiotic treatment at this time, however since going out of town , script given for azithromycin Depo medrol administered in office

## 2015-10-29 NOTE — Assessment & Plan Note (Signed)
Controlled, no change in medication Hyperlipidemia:Low fat diet discussed and encouraged.   Lipid Panel  Lab Results  Component Value Date   CHOL 189 09/18/2015   HDL 56 09/18/2015   LDLCALC 107 09/18/2015   TRIG 132 09/18/2015   CHOLHDL 3.4 09/18/2015

## 2015-12-10 ENCOUNTER — Encounter (HOSPITAL_COMMUNITY): Payer: Self-pay | Admitting: *Deleted

## 2015-12-10 ENCOUNTER — Emergency Department (HOSPITAL_COMMUNITY): Payer: PPO

## 2015-12-10 ENCOUNTER — Emergency Department (HOSPITAL_COMMUNITY)
Admission: EM | Admit: 2015-12-10 | Discharge: 2015-12-10 | Disposition: A | Discharge status: Home / Self Care | Payer: PPO | Attending: Emergency Medicine | Admitting: Emergency Medicine

## 2015-12-10 DIAGNOSIS — K219 Gastro-esophageal reflux disease without esophagitis: Secondary | ICD-10-CM | POA: Diagnosis not present

## 2015-12-10 DIAGNOSIS — E663 Overweight: Secondary | ICD-10-CM | POA: Diagnosis not present

## 2015-12-10 DIAGNOSIS — M545 Low back pain: Secondary | ICD-10-CM | POA: Diagnosis not present

## 2015-12-10 DIAGNOSIS — Z7951 Long term (current) use of inhaled steroids: Secondary | ICD-10-CM | POA: Diagnosis not present

## 2015-12-10 DIAGNOSIS — Y9339 Activity, other involving climbing, rappelling and jumping off: Secondary | ICD-10-CM | POA: Insufficient documentation

## 2015-12-10 DIAGNOSIS — Y9289 Other specified places as the place of occurrence of the external cause: Secondary | ICD-10-CM | POA: Diagnosis not present

## 2015-12-10 DIAGNOSIS — S39012A Strain of muscle, fascia and tendon of lower back, initial encounter: Secondary | ICD-10-CM | POA: Diagnosis not present

## 2015-12-10 DIAGNOSIS — X58XXXA Exposure to other specified factors, initial encounter: Secondary | ICD-10-CM | POA: Insufficient documentation

## 2015-12-10 DIAGNOSIS — Z87891 Personal history of nicotine dependence: Secondary | ICD-10-CM | POA: Diagnosis not present

## 2015-12-10 DIAGNOSIS — Z86010 Personal history of colonic polyps: Secondary | ICD-10-CM | POA: Insufficient documentation

## 2015-12-10 DIAGNOSIS — Z86018 Personal history of other benign neoplasm: Secondary | ICD-10-CM | POA: Insufficient documentation

## 2015-12-10 DIAGNOSIS — E785 Hyperlipidemia, unspecified: Secondary | ICD-10-CM | POA: Diagnosis not present

## 2015-12-10 DIAGNOSIS — I10 Essential (primary) hypertension: Secondary | ICD-10-CM | POA: Insufficient documentation

## 2015-12-10 DIAGNOSIS — Y998 Other external cause status: Secondary | ICD-10-CM | POA: Insufficient documentation

## 2015-12-10 DIAGNOSIS — Z862 Personal history of diseases of the blood and blood-forming organs and certain disorders involving the immune mechanism: Secondary | ICD-10-CM | POA: Insufficient documentation

## 2015-12-10 DIAGNOSIS — Z79899 Other long term (current) drug therapy: Secondary | ICD-10-CM | POA: Insufficient documentation

## 2015-12-10 DIAGNOSIS — S3992XA Unspecified injury of lower back, initial encounter: Secondary | ICD-10-CM | POA: Diagnosis not present

## 2015-12-10 DIAGNOSIS — Z7982 Long term (current) use of aspirin: Secondary | ICD-10-CM | POA: Insufficient documentation

## 2015-12-10 NOTE — ED Provider Notes (Signed)
CSN: [id number]     Arrival date & time 12/10/15  1158 History   First MD Initiated Contact with Patient 12/10/15 1353     Chief Complaint  Patient presents with  . Back Pain    Patient is a 75 y.o. male presenting with back pain. The history is provided by the patient.  Back Pain Location:  Lumbar spine Quality:  Aching Radiates to:  Does not radiate Pain severity:  Moderate Onset quality:  Sudden Duration:  5 days Timing:  Constant Progression:  Worsening Chronicity:  New Context: jumping from heights   Relieved by:  Bed rest Worsened by:  Ambulation Ineffective treatments:  OTC medications Associated symptoms: no abdominal pain, no bladder incontinence, no bowel incontinence, no dysuria, no fever and no weakness   Risk factors: no recent surgery   Patient reports he was jumped off tractor (landed on feet, no direct injury to back, does this frequently) And soon after developed low back pain  no other symptoms He has never had this before No h/o back surgery   Past Medical History  Diagnosis Date  . Hypertension 2003  . Hyperlipidemia 2003  . History of migraines childhood     Flared up in 2009, and have responded to depakote which was stopped in 9 months  . Lung nodules since 2009    CT scan advised in 08/2010  . Neoplasm     dermal  . Overweight(278.02)   . Hemorrhoids     and minimal polp  . GERD (gastroesophageal reflux disease)   . Hx of colonic polyps 02/24/2007  . H/O hiatal hernia   . Anemia, iron deficiency    Past Surgical History  Procedure Laterality Date  . Removal of skin cancer twice    . Colonoscopy  2008    Fields-mod IH, 19mm polyp sigmoid colon (disappeared w/ insufflation & could not be retrieved)  . Esophagogastroduodenoscopy  01/05/2007    Fields-sm HH, benign SB biopsy  . Givens capsule study  01/20/2007    Fields-nothing to explain IDA, lymphectasias  . Colonoscopy  05/19/2012    Procedure: COLONOSCOPY;  Surgeon: Danie Binder, MD;   Location: [facility];  Service: Endoscopy;  Laterality: N/A;  8:30   Family History  Problem Relation Age of Onset  . Heart defect Mother     pacemaker   . Alzheimer's disease Mother 45    2011  . Heart defect Brother     bradycardia   . Heart failure Father   . Colon cancer Neg Hx    Social History  Substance Use Topics  . Smoking status: Former Smoker -- 2.00 packs/day for 30 years    Types: Cigarettes  . Smokeless tobacco: Former Systems developer    Types: Snuff, Chew  . Alcohol Use: No    Review of Systems  Constitutional: Negative for fever.  Gastrointestinal: Negative for abdominal pain and bowel incontinence.  Genitourinary: Negative for bladder incontinence and dysuria.  Musculoskeletal: Positive for back pain.  Neurological: Negative for weakness.  All other systems reviewed and are negative.     Allergies  Ace inhibitors  Home Medications   Prior to Admission medications   Medication Sig Start Date End Date Taking? Authorizing Provider  aspirin (ASPIRIN LOW DOSE) 81 MG EC tablet Take 81 mg by mouth every morning.     Historical Provider, MD  butalbital-acetaminophen-caffeine (ESGIC) 50-325-40 MG per tablet Take 1 tablet by mouth every 6 (six) hours as needed for headache. No more than  twice weekly 10/18/14   Fayrene Helper, MD  fluticasone [facility]) 50 MCG/ACT nasal spray Place 2 sprays into both nostrils daily. 09/27/15   Fayrene Helper, MD  Krill Oil 300 MG CAPS Take 1 capsule by mouth daily.    Historical Provider, MD  loratadine (CLARITIN) 10 MG tablet Take 10 mg by mouth daily.    Historical Provider, MD  Multiple Vitamin (MULTIVITAMIN WITH MINERALS) TABS Take 1 tablet by mouth daily.    Historical Provider, MD  omeprazole (PRILOSEC) 20 MG capsule Take 1 capsule (20 mg total) by mouth every morning. 05/16/15   Fayrene Helper, MD  simvastatin (ZOCOR) 40 MG tablet TAKE ONE TABLET BY MOUTH EVERY DAY AT BEDTIME 05/16/15   Fayrene Helper, MD  terazosin  (HYTRIN) 2 MG capsule Take 1 capsule (2 mg total) by mouth at bedtime. 05/16/15 05/15/16  Fayrene Helper, MD   BP 170/100 mmHg  Pulse 92  Temp(Src) 99.9 F (37.7 C) (Temporal)  Resp 20  Ht 5\' 10"  (1.778 m)  Wt 95.255 kg  BMI 30.13 kg/m2  SpO2 98% Physical Exam CONSTITUTIONAL: Well developed/well nourished HEAD: Normocephalic/atraumatic EYES: EOMI/PERRL ENMT: Mucous membranes moist NECK: supple no meningeal signs SPINE/BACK:lumbar spine tenderness, no stepoffs CV: S1/S2 noted, no murmurs/rubs/gallops noted LUNGS: Lungs are clear to auscultation bilaterally, no apparent distress ABDOMEN: soft, nontender NEURO: Awake/alert,equal motor 5/5 strength noted with the following: hip flexion/knee flexion/extension, foot dorsi/plantar flexion, great toe extension intact bilaterally, no clonus bilaterally,  no sensory deficit in any dermatome.  Pt is able to ambulate unassisted. EXTREMITIES: pulses normal, full ROM SKIN: warm, color normal PSYCH: no abnormalities of mood noted, alert and oriented to situation  ED Course  Procedures  2:11 PM Pt seen by chiropractor who advised he needed imaging prior to any manipulation He declines pain meds 3:15 PM Xray negative for acute fx No neuro deficits Stable for d/c home We discussed strict return precautions   Imaging Review Dg Lumbar Spine Complete  12/10/2015  CLINICAL DATA:  Low back pain after jumping off a tractor on Thursday EXAM: [facility] 4+ VIEW COMPARISON:  06/23/2008 FINDINGS: Five views of lumbar spine submitted. No acute fracture. Multilevel anterior mild endplate osteophytes noted. The vertebral body heights is preserved. There is about 5 mm anterolisthesis L5 on S1 vertebral body. Mild to moderate disc space flattening at L5-S1 level. Facet degenerative changes noted L5 level. IMPRESSION: No acute fracture. Degenerative changes as described above. There is about 5 mm anterolisthesis L5 on S1 vertebral body. Facet  degenerative changes at L5 level. Moderate disc space flattening with anterior spurring at L5-S1 level. Electronically Signed   By: Lahoma Crocker M.D.   On: 12/10/2015 14:54   I have personally reviewed and evaluated these images  results as part of my medical decision-making.   MDM   Final diagnoses:  Lumbar strain, initial encounter   Nursing notes including past medical history and social history reviewed and considered in documentation xrays/imaging reviewed by myself and considered during evaluation      Ripley Fraise, MD 12/10/15 1516

## 2015-12-10 NOTE — Discharge Instructions (Signed)

## 2015-12-10 NOTE — ED Notes (Signed)
Pt states pain to lower back after jumping off of a tractor on Thursday. Denies any other symptoms at this time.

## 2015-12-11 ENCOUNTER — Other Ambulatory Visit: Payer: Self-pay | Admitting: Family Medicine

## 2015-12-11 ENCOUNTER — Telehealth: Payer: Self-pay | Admitting: Family Medicine

## 2015-12-11 DIAGNOSIS — M549 Dorsalgia, unspecified: Secondary | ICD-10-CM

## 2015-12-11 NOTE — Telephone Encounter (Signed)
Pls call and let pt know from his ED visit yesterday I see he needs to see Dr Saintclair Halsted urgently , I have entered referral pls follow through, thanks

## 2015-12-11 NOTE — Telephone Encounter (Signed)
Referral has been sent to Dr. Saintclair Halsted

## 2015-12-12 ENCOUNTER — Ambulatory Visit: Payer: PPO

## 2015-12-12 VITALS — BP 140/92

## 2015-12-12 DIAGNOSIS — M9905 Segmental and somatic dysfunction of pelvic region: Secondary | ICD-10-CM | POA: Diagnosis not present

## 2015-12-12 DIAGNOSIS — M9902 Segmental and somatic dysfunction of thoracic region: Secondary | ICD-10-CM | POA: Diagnosis not present

## 2015-12-12 DIAGNOSIS — M9903 Segmental and somatic dysfunction of lumbar region: Secondary | ICD-10-CM | POA: Diagnosis not present

## 2015-12-12 DIAGNOSIS — M545 Low back pain: Secondary | ICD-10-CM | POA: Diagnosis not present

## 2015-12-12 DIAGNOSIS — I10 Essential (primary) hypertension: Secondary | ICD-10-CM

## 2015-12-12 NOTE — Progress Notes (Signed)
Recent Dr visit told him BP was elevated. Came in today for BP check- still having back pain. BP was elevated but since past readings have been normal, pt advised to follow DASH diet and exercise more and return in May for follow up

## 2015-12-14 DIAGNOSIS — M9902 Segmental and somatic dysfunction of thoracic region: Secondary | ICD-10-CM | POA: Diagnosis not present

## 2015-12-14 DIAGNOSIS — M9903 Segmental and somatic dysfunction of lumbar region: Secondary | ICD-10-CM | POA: Diagnosis not present

## 2015-12-14 DIAGNOSIS — M545 Low back pain: Secondary | ICD-10-CM | POA: Diagnosis not present

## 2015-12-14 DIAGNOSIS — M9905 Segmental and somatic dysfunction of pelvic region: Secondary | ICD-10-CM | POA: Diagnosis not present

## 2015-12-21 ENCOUNTER — Other Ambulatory Visit: Payer: Self-pay | Admitting: Family Medicine

## 2015-12-21 DIAGNOSIS — M9902 Segmental and somatic dysfunction of thoracic region: Secondary | ICD-10-CM | POA: Diagnosis not present

## 2015-12-21 DIAGNOSIS — M545 Low back pain: Secondary | ICD-10-CM | POA: Diagnosis not present

## 2015-12-21 DIAGNOSIS — M9903 Segmental and somatic dysfunction of lumbar region: Secondary | ICD-10-CM | POA: Diagnosis not present

## 2015-12-21 DIAGNOSIS — M9905 Segmental and somatic dysfunction of pelvic region: Secondary | ICD-10-CM | POA: Diagnosis not present

## 2016-02-28 DIAGNOSIS — H43812 Vitreous degeneration, left eye: Secondary | ICD-10-CM | POA: Diagnosis not present

## 2016-03-06 DIAGNOSIS — E785 Hyperlipidemia, unspecified: Secondary | ICD-10-CM | POA: Diagnosis not present

## 2016-03-06 DIAGNOSIS — I10 Essential (primary) hypertension: Secondary | ICD-10-CM | POA: Diagnosis not present

## 2016-03-07 LAB — COMPREHENSIVE METABOLIC PANEL
ALT: 22 U/L (ref 9–46)
AST: 28 U/L (ref 10–35)
Albumin: 4.4 g/dL (ref 3.6–5.1)
Alkaline Phosphatase: 48 U/L (ref 40–115)
BUN: 16 mg/dL (ref 7–25)
CO2: 23 mmol/L (ref 20–31)
Calcium: 9.2 mg/dL (ref 8.6–10.3)
Chloride: 106 mmol/L (ref 98–110)
Creat: 1.39 mg/dL — ABNORMAL HIGH (ref 0.70–1.18)
Glucose, Bld: 84 mg/dL (ref 65–99)
Potassium: 4.6 mmol/L (ref 3.5–5.3)
Sodium: 142 mmol/L (ref 135–146)
Total Bilirubin: 0.6 mg/dL (ref 0.2–1.2)
Total Protein: 6.7 g/dL (ref 6.1–8.1)

## 2016-03-07 LAB — LIPID PANEL
Cholesterol: 189 mg/dL (ref 125–200)
HDL: 52 mg/dL (ref >=40)
LDL Cholesterol: 111 mg/dL (ref <130)
Total CHOL/HDL Ratio: 3.6 Ratio (ref <=5.0)
Triglycerides: 130 mg/dL (ref <150)
VLDL: 26 mg/dL (ref <30)

## 2016-03-10 ENCOUNTER — Encounter: Payer: Self-pay | Admitting: Family Medicine

## 2016-03-10 ENCOUNTER — Ambulatory Visit (INDEPENDENT_AMBULATORY_CARE_PROVIDER_SITE_OTHER): Payer: PPO | Admitting: Family Medicine

## 2016-03-10 VITALS — BP 140/94 | HR 82 | Resp 18 | Ht 69.0 in | Wt 218.0 lb

## 2016-03-10 DIAGNOSIS — R51 Headache: Secondary | ICD-10-CM

## 2016-03-10 DIAGNOSIS — E66811 Obesity, class 1: Secondary | ICD-10-CM

## 2016-03-10 DIAGNOSIS — R7302 Impaired glucose tolerance (oral): Secondary | ICD-10-CM | POA: Diagnosis not present

## 2016-03-10 DIAGNOSIS — E785 Hyperlipidemia, unspecified: Secondary | ICD-10-CM

## 2016-03-10 DIAGNOSIS — I10 Essential (primary) hypertension: Secondary | ICD-10-CM

## 2016-03-10 DIAGNOSIS — Z125 Encounter for screening for malignant neoplasm of prostate: Secondary | ICD-10-CM | POA: Diagnosis not present

## 2016-03-10 DIAGNOSIS — R519 Headache, unspecified: Secondary | ICD-10-CM

## 2016-03-10 DIAGNOSIS — E669 Obesity, unspecified: Secondary | ICD-10-CM

## 2016-03-10 MED ORDER — METOPROLOL SUCCINATE ER 50 MG PO TB24: 50.0000 mg | ORAL_TABLET | Freq: Every day | ORAL | Status: DC

## 2016-03-10 NOTE — Assessment & Plan Note (Signed)
Deteriorated. Patient re-educated about  the importance of commitment to a  minimum of 150 minutes of exercise per week.  The importance of healthy food choices with portion control discussed. Encouraged to start a food diary, count calories and to consider  joining a support group. Sample diet sheets offered. Goals set by the patient for the next several months.   Weight /BMI 03/10/2016 12/10/2015 10/08/2015  WEIGHT 218 lb 210 lb 210 lb  HEIGHT 5\' 9"  5\' 10"  5\' 9"   BMI 32.18 kg/m2 30.13 kg/m2 31 kg/m2    Current exercise per week 90 minutes.

## 2016-03-10 NOTE — Assessment & Plan Note (Addendum)
Unciontrolled, add toprol  DASH diet and commitment to daily physical activity for a minimum of 30 minutes discussed and encouraged, as a part of hypertension management. The importance of attaining a healthy weight is also discussed.  BP/Weight 03/10/2016 12/12/2015 12/10/2015 10/08/2015 09/27/2015 04/12/2015 [id number]  Systolic BP XX123456 XX123456 [id number]  Diastolic BP 94 92 [id number] 84 84 84 90  Wt. (Lbs) 218 - 210 210 210.4 209 213  BMI 32.18 - 30.13 31 31.06 30.85 31.44      xl

## 2016-03-10 NOTE — Patient Instructions (Addendum)
Annual physical in 4 month, call if you need me sooner  Blood pressure is elevated  Headaches are too frequent a up to 2 per week  NEW for both blood pressure and to reduce headache frequency is metoprolol 50 mg ONE every morning  Cholesterol, kidney and liver function are excellent  Non fast PSA, CBC, chem 7 and TSH in 4 month  Please work on good  health habits so that your health will improve. 1. Commitment to daily physical activity for 30 to 60  minutes, if you are able to do this.  2. Commitment to wise food choices. Aim for half of your  food intake to be vegetable and fruit, one quarter starchy foods, and one quarter protein. Try to eat on a regular schedule  3 meals per day, snacking between meals should be limited to vegetables or fruits or small portions of nuts. 64 ounces of water per day is generally recommended, unless you have specific health conditions, like heart failure or kidney failure where you will need to limit fluid intake.  3. Commitment to sufficient and a  good quality of physical and mental rest daily, generally between 6 to 8 hours per day.  WITH PERSISTANCE AND PERSEVERANCE, THE IMPOSSIBLE , BECOMES THE NORM!   Thank you  for choosing [facility]. We consider it a privelige to serve you.  Delivering excellent health care in a caring and  compassionate way is our goal.  Partnering with you,  so that together we can achieve this goal is our strategy.

## 2016-03-10 NOTE — Assessment & Plan Note (Signed)
Patient educated about the importance of limiting  Carbohydrate intake , the need to commit to daily physical activity for a minimum of 30 minutes , and to commit weight loss. The fact that changes in all these areas will reduce or eliminate all together the development of diabetes is stressed.  Updated lab needed at/ before next visit.   Diabetic Labs Latest Ref Rng 03/06/2016 09/18/2015 03/28/2015 09/19/2014 04/24/2014  HbA1c <5.7 % - - 5.6 5.7(H) 5.7(H)  Chol 125 - 200 mg/dL 189 189 177 180 186  HDL >=40 mg/dL 52 56 48 52 49  Calc LDL <130 mg/dL 111 107 103(H) 103(H) 107(H)  Triglycerides <150 mg/dL 130 132 130 126 149  Creatinine 0.70 - 1.18 mg/dL 1.39(H) 1.36(H) 1.44(H) 1.47(H) 1.39(H)   BP/Weight 03/10/2016 12/12/2015 12/10/2015 10/08/2015 09/27/2015 04/12/2015 [id number]  Systolic BP XX123456 XX123456 [id number]  Diastolic BP 94 92 [id number] 84 84 84 90  Wt. (Lbs) 218 - 210 210 210.4 209 213  BMI 32.18 - 30.13 31 31.06 30.85 31.44   No flowsheet data found.

## 2016-03-10 NOTE — Assessment & Plan Note (Signed)
Has between 2 headaches per week, to one every 2 weeks, notes increased frequency in past 6 months. Headaches are left periorbital and relieved by rest No focal neurologic deficits by history or on exam

## 2016-03-10 NOTE — Progress Notes (Signed)
Subjective:    Patient ID: [name] [name], male    DOB: [birth year], 75 y.o.   MRN: [id number]  HPI   [name]     MRN: [id number]      DOB: 09-11-1941   HPI [name] is here for follow up and re-evaluation of chronic medical conditions, medication management and review of any available recent lab and radiology data.  Preventive health is updated, specifically  Cancer screening and Immunization.   Questions or concerns regarding consultations or procedures which the PT has had in the interim are  addressed. The PT denies any adverse reactions to current medications since the last visit.  C/o increased frequency in headaches, which are left peri orbital, and relived somewhat with sleep , in the past 6 months  ROS Denies recent fever or chills. Denies sinus pressure, nasal congestion, ear pain or sore throat. Denies chest congestion, productive cough or wheezing. Denies chest pains, palpitations and leg swelling Denies abdominal pain, nausea, vomiting,diarrhea or constipation.   Denies dysuria, frequency, hesitancy or incontinence. Denies joint pain, swelling and limitation in mobility. Denies seizures, numbness, or tingling. Denies depression, anxiety or insomnia. Denies skin break down or rash.   PE  BP 140/94 mmHg  Pulse 82  Resp 18  Ht 5\' 9"  (1.753 m)  Wt 218 lb (98.884 kg)  BMI 32.18 kg/m2  SpO2 93%  Patient alert and oriented and in no cardiopulmonary distress.  HEENT: No facial asymmetry, EOMI,   oropharynx pink and moist.  Neck supple no JVD, no mass.  Chest: Clear to auscultation bilaterally.  CVS: S1, S2 no murmurs, no S3.Regular rate.  ABD: Soft non tender.   Ext: No edema  MS: Adequate ROM spine, shoulders, hips and knees.  Skin: Intact, no ulcerations or rash noted.  Psych: Good eye contact, normal affect. Memory intact not anxious or depressed appearing.  CNS: CN 2-12 intact, power,  normal throughout.no focal deficits  noted.   Assessment & Plan   Essential hypertension Unciontrolled, add toprol  DASH diet and commitment to daily physical activity for a minimum of 30 minutes discussed and encouraged, as a part of hypertension management. The importance of attaining a healthy weight is also discussed.  BP/Weight 03/10/2016 12/12/2015 12/10/2015 10/08/2015 09/27/2015 04/12/2015 [id number]  Systolic BP [id number] XX123456 [id number]  Diastolic BP 94 92 123XX123 84 84 84 90  Wt. (Lbs) 218 - 210 210 210.4 209 213  BMI 32.18 - 30.13 31 31.06 30.85 31.44      xl  Frequent headaches Has between 2 headaches per week, to one every 2 weeks, notes increased frequency in past 6 months. Headaches are left periorbital and relieved by rest No focal neurologic deficits by history or on exam  Hyperlipemia Hyperlipidemia:Low fat diet discussed and encouraged.   Lipid Panel  Lab Results  Component Value Date   CHOL 189 03/06/2016   HDL 52 03/06/2016   LDLCALC 111 03/06/2016   TRIG 130 03/06/2016   CHOLHDL 3.6 03/06/2016      Controlled, no change in medication   Obesity (BMI 30.0-34.9) Deteriorated. Patient re-educated about  the importance of commitment to a  minimum of 150 minutes of exercise per week.  The importance of healthy food choices with portion control discussed. Encouraged to start a food diary, count calories and to consider  joining a support group. Sample diet sheets offered. Goals set by the patient for the next several months.   Weight /BMI 03/10/2016 12/10/2015  10/08/2015  WEIGHT 218 lb 210 lb 210 lb  HEIGHT 5\' 9"  5\' 10"  5\' 9"   BMI 32.18 kg/m2 30.13 kg/m2 31 kg/m2    Current exercise per week 90 minutes.   IGT (impaired glucose tolerance) Patient educated about the importance of limiting  Carbohydrate intake , the need to commit to daily physical activity for a minimum of 30 minutes , and to commit weight loss. The fact that changes in all these areas will reduce or eliminate all  together the development of diabetes is stressed.  Updated lab needed at/ before next visit.   Diabetic Labs Latest Ref Rng 03/06/2016 09/18/2015 03/28/2015 09/19/2014 04/24/2014  HbA1c <5.7 % - - 5.6 5.7(H) 5.7(H)  Chol 125 - 200 mg/dL 189 189 177 180 186  HDL >=40 mg/dL 52 56 48 52 49  Calc LDL <130 mg/dL 111 107 103(H) 103(H) 107(H)  Triglycerides <150 mg/dL 130 132 130 126 149  Creatinine 0.70 - 1.18 mg/dL 1.39(H) 1.36(H) 1.44(H) 1.47(H) 1.39(H)   BP/Weight 03/10/2016 12/12/2015 12/10/2015 10/08/2015 09/27/2015 04/12/2015 [id number]  Systolic BP XX123456 XX123456 [id number]  Diastolic BP 94 92 123XX123 84 84 84 90  Wt. (Lbs) 218 - 210 210 210.4 209 213  BMI 32.18 - 30.13 31 31.06 30.85 31.44   No flowsheet data found.          Review of Systems     Objective:   Physical Exam        Assessment & Plan:

## 2016-03-10 NOTE — Assessment & Plan Note (Signed)
Hyperlipidemia:Low fat diet discussed and encouraged.   Lipid Panel  Lab Results  Component Value Date   CHOL 189 03/06/2016   HDL 52 03/06/2016   LDLCALC 111 03/06/2016   TRIG 130 03/06/2016   CHOLHDL 3.6 03/06/2016      Controlled, no change in medication

## 2016-03-11 ENCOUNTER — Other Ambulatory Visit: Payer: Self-pay | Admitting: Family Medicine

## 2016-04-21 DIAGNOSIS — L570 Actinic keratosis: Secondary | ICD-10-CM | POA: Diagnosis not present

## 2016-05-09 ENCOUNTER — Other Ambulatory Visit: Payer: Self-pay | Admitting: Family Medicine

## 2016-06-09 ENCOUNTER — Other Ambulatory Visit: Payer: Self-pay

## 2016-06-09 DIAGNOSIS — I10 Essential (primary) hypertension: Secondary | ICD-10-CM

## 2016-06-09 MED ORDER — METOPROLOL SUCCINATE ER 50 MG PO TB24: 50.0000 mg | ORAL_TABLET | Freq: Every day | ORAL | 4 refills | Status: DC

## 2016-08-18 ENCOUNTER — Encounter: Payer: Self-pay | Admitting: Family Medicine

## 2016-08-18 ENCOUNTER — Ambulatory Visit (INDEPENDENT_AMBULATORY_CARE_PROVIDER_SITE_OTHER): Payer: PPO | Admitting: Family Medicine

## 2016-08-18 VITALS — BP 128/86 | HR 84 | Temp 97.8°F | Resp 20 | Ht 69.0 in | Wt 209.1 lb

## 2016-08-18 DIAGNOSIS — J069 Acute upper respiratory infection, unspecified: Secondary | ICD-10-CM

## 2016-08-18 MED ORDER — AZITHROMYCIN 250 MG PO TABS: ORAL_TABLET | ORAL | 0 refills | Status: DC

## 2016-08-18 NOTE — Progress Notes (Signed)
Subjective:     [name] [name] is a 75 y.o. male who presents for evaluation of symptoms of a URI. Symptoms include achiness, congestion, facial pain, headache described as mild, low grade fever, nasal congestion, non productive cough, post nasal drip and sore throat. Onset of symptoms was 5 days ago, and has been gradually worsening since that time. Treatment to date: none.  He is on loratadine.  He is concerned because he had chills and body aches last night.  The following portions of the patient's history were reviewed and updated as appropriate: past family history, past social history and past surgical history.  Review of Systems Pertinent items are noted in HPI.   Objective:    BP 128/86 (BP Location: Right Arm, Patient Position: Sitting, Cuff Size: Normal)   Pulse 84   Temp 97.8 F (36.6 C) (Oral)   Resp 20   Ht 5\' 9"  (1.753 m)   Wt 209 lb 1.9 oz (94.9 kg)   SpO2 99%   BMI 30.88 kg/m  General appearance: alert, appears stated age, fatigued and no distress Head: Normocephalic, without obvious abnormality, atraumatic Eyes: conjunctivae/corneas clear. PERRL, EOM's intact. Fundi benign. Ears: normal TM's and external ear canals both ears Nose: clear discharge, mild congestion, no sinus tenderness Throat: lips, mucosa, and tongue normal; teeth and gums normal and post pharynx mildly injected Neck: no adenopathy, no carotid bruit and thyroid not enlarged, symmetric, no tenderness/mass/nodules Back: symmetric, no curvature. ROM normal. No CVA tenderness. Lungs: clear to auscultation bilaterally Heart: regular rate and rhythm, S1, S2 normal, no murmur, click, rub or gallop Abdomen: normal findings: bowel sounds normal and soft, non-tender Extremities: extremities normal, atraumatic, no cyanosis or edema   Assessment:    viral upper respiratory illness   Plan:    Discussed diagnosis and treatment of URI. Discussed the importance of avoiding unnecessary antibiotic  therapy. Suggested symptomatic OTC remedies. Nasal saline spray for congestion. Follow up as needed.   Antibiotic prescribed in case of worsening of sx

## 2016-08-18 NOTE — Patient Instructions (Signed)
Rest Push fluids Take the loratadine May add ibuprofen for body aches May use a saline nasal spray for the sinuses Call if not better in a few days Fill and take the azithromycin if the symptoms worsens

## 2016-08-20 ENCOUNTER — Other Ambulatory Visit: Payer: Self-pay | Admitting: Family Medicine

## 2016-08-26 DIAGNOSIS — I10 Essential (primary) hypertension: Secondary | ICD-10-CM | POA: Diagnosis not present

## 2016-08-26 DIAGNOSIS — Z125 Encounter for screening for malignant neoplasm of prostate: Secondary | ICD-10-CM | POA: Diagnosis not present

## 2016-08-26 LAB — CBC
HCT: 39.9 % (ref 38.5–50.0)
Hemoglobin: 13 g/dL — ABNORMAL LOW (ref 13.2–17.1)
MCH: 28.3 pg (ref 27.0–33.0)
MCHC: 32.6 g/dL (ref 32.0–36.0)
MCV: 86.7 fL (ref 80.0–100.0)
MPV: 10.1 fL (ref 7.5–12.5)
Platelets: 242 10*3/uL (ref 140–400)
RBC: 4.6 MIL/uL (ref 4.20–5.80)
RDW: 14.4 % (ref 11.0–15.0)
WBC: 6.1 10*3/uL (ref 3.8–10.8)

## 2016-08-27 LAB — BASIC METABOLIC PANEL
BUN: 13 mg/dL (ref 7–25)
CO2: 27 mmol/L (ref 20–31)
Calcium: 8.8 mg/dL (ref 8.6–10.3)
Chloride: 109 mmol/L (ref 98–110)
Creat: 1.27 mg/dL — ABNORMAL HIGH (ref 0.70–1.18)
Glucose, Bld: 89 mg/dL (ref 65–99)
Potassium: 3.9 mmol/L (ref 3.5–5.3)
Sodium: 143 mmol/L (ref 135–146)

## 2016-08-27 LAB — PSA: PSA: 0.7 ng/mL (ref <=4.0)

## 2016-08-27 LAB — TSH: TSH: 1.87 mIU/L (ref 0.40–4.50)

## 2016-09-01 ENCOUNTER — Encounter: Payer: Self-pay | Admitting: Family Medicine

## 2016-09-01 ENCOUNTER — Ambulatory Visit (INDEPENDENT_AMBULATORY_CARE_PROVIDER_SITE_OTHER): Payer: PPO | Admitting: Family Medicine

## 2016-09-01 VITALS — BP 132/82 | HR 85 | Resp 14 | Ht 70.0 in | Wt 209.0 lb

## 2016-09-01 DIAGNOSIS — Z0000 Encounter for general adult medical examination without abnormal findings: Secondary | ICD-10-CM

## 2016-09-01 DIAGNOSIS — Z23 Encounter for immunization: Secondary | ICD-10-CM | POA: Diagnosis not present

## 2016-09-01 DIAGNOSIS — Z125 Encounter for screening for malignant neoplasm of prostate: Secondary | ICD-10-CM

## 2016-09-01 DIAGNOSIS — E790 Hyperuricemia without signs of inflammatory arthritis and tophaceous disease: Secondary | ICD-10-CM

## 2016-09-01 DIAGNOSIS — R7302 Impaired glucose tolerance (oral): Secondary | ICD-10-CM

## 2016-09-01 DIAGNOSIS — E559 Vitamin D deficiency, unspecified: Secondary | ICD-10-CM

## 2016-09-01 DIAGNOSIS — M79674 Pain in right toe(s): Secondary | ICD-10-CM

## 2016-09-01 DIAGNOSIS — E785 Hyperlipidemia, unspecified: Secondary | ICD-10-CM

## 2016-09-01 DIAGNOSIS — Z1211 Encounter for screening for malignant neoplasm of colon: Secondary | ICD-10-CM | POA: Diagnosis not present

## 2016-09-01 DIAGNOSIS — M79676 Pain in unspecified toe(s): Secondary | ICD-10-CM | POA: Insufficient documentation

## 2016-09-01 HISTORY — DX: Encounter for general adult medical examination without abnormal findings: Z00.00

## 2016-09-01 LAB — POC HEMOCCULT BLD/STL (OFFICE/1-CARD/DIAGNOSTIC): Fecal Occult Blood, POC: NEGATIVE

## 2016-09-01 LAB — PSA, MEDICARE

## 2016-09-01 MED ORDER — INDOMETHACIN 25 MG PO CAPS: ORAL_CAPSULE | ORAL | 0 refills | Status: DC

## 2016-09-01 MED ORDER — PREDNISONE 5 MG PO TABS: 5.0000 mg | ORAL_TABLET | Freq: Two times a day (BID) | ORAL | 0 refills | Status: AC

## 2016-09-01 NOTE — Assessment & Plan Note (Signed)

## 2016-09-01 NOTE — Progress Notes (Signed)
   [name] [name]     MRN: [id number]      DOB: 1941/03/20   HPI: Patient is in for annual physical exam. 3 day h/o acute pain and redness of right great toe, no trauma, this has happened in the past Labs today will be reviewed and pt contacted after the visit Immunization is reviewed , and  updated   PE; BP 132/82   Pulse 85   Resp 14   Ht 5\' 10"  (1.778 m)   Wt 209 lb (94.8 kg)   SpO2 95%   BMI 29.99 kg/m   Pleasant male, alert and oriented x 3, in no cardio-pulmonary distress. Afebrile. HEENT No facial trauma or asymetry. Sinuses non tender. EOMI, pupils equally reactive to light. External ears normal, tympanic membranes clear. Oropharynx moist, no exudate. Neck: supple, no adenopathy,JVD or thyromegaly.No bruits.  Chest: Clear to ascultation bilaterally.No crackles or wheezes. Non tender to palpation  Breast: No asymetry,no masses. No nipple discharge or inversion. No axillary or supraclavicular adenopathy  Cardiovascular system; Heart sounds normal,  S1 and  S2 ,no S3.  No murmur, or thrill. Apical beat not displaced Peripheral pulses normal.  Abdomen: Soft, non tender, no organomegaly or masses. No bruits. Bowel sounds normal. No guarding, tenderness or rebound.  Rectal:  Normal sphincter tone. No hemorrhoids or  masses. guaiac negative stool. Prostate smooth and firm    Musculoskeletal exam: Full ROM of spine, hips , shoulders and knees. No deformity ,swelling or crepitus noted. No muscle wasting or atrophy.  Right great toe MP joint red, warm and tender Neurologic: Cranial nerves 2 to 12 intact. Power, tone ,sensation and reflexes normal throughout. No disturbance in gait. No tremor.  Skin: Intact, no ulceration, erythema , scaling or rash noted. Pigmentation normal throughout  Psych; Normal mood and affect. Judgement and concentration normal   Assessment & Plan:  Annual physical exam Annual exam as documented. Counseling done  re  healthy lifestyle involving commitment to 150 minutes exercise per week, heart healthy diet, and attaining healthy weight.The importance of adequate sleep also discussed. Regular seat belt use and home safety, is also discussed. Changes in health habits are decided on by the patient with goals and time frames  set for achieving them. Immunization and cancer screening needs are specifically addressed at this visit.   Great toe pain 3 day h/o unprovoked right great toe pain, exam c/w acute goiut, short course of indomethacin and prednisone prescribed, uric acid level being checked also

## 2016-09-01 NOTE — Assessment & Plan Note (Signed)
3 day h/o unprovoked right great toe pain, exam c/w acute goiut, short course of indomethacin and prednisone prescribed, uric acid level being checked also

## 2016-09-01 NOTE — Patient Instructions (Addendum)
FWellness Dec 20 or after ,  mD follow up in 5.5 months, call iof you need me before  Flu vaccine today  You are treated with 5 day course of indomethacin and prednisone for acute right great toe pain , and I am checking blood for uric acid level as I believe this is gout  Thanks for choosing [facility], we consider it a privelige to serve you. Please work on good  health habits so that your health will improve. 1. Commitment to daily physical activity for 30 to 60  minutes, if you are able to do this.  2. Commitment to wise food choices. Aim for half of your  food intake to be vegetable and fruit, one quarter starchy foods, and one quarter protein. Try to eat on a regular schedule  3 meals per day, snacking between meals should be limited to vegetables or fruits or small portions of nuts. 64 ounces of water per day is generally recommended, unless you have specific health conditions, like heart failure or kidney failure where you will need to limit fluid intake.  3. Commitment to sufficient and a  good quality of physical and mental rest daily, generally between 6 to 8 hours per day.  WITH PERSISTANCE AND PERSEVERANCE, THE IMPOSSIBLE , BECOMES THE NORM!

## 2016-09-02 ENCOUNTER — Telehealth: Payer: Self-pay

## 2016-09-02 DIAGNOSIS — Z125 Encounter for screening for malignant neoplasm of prostate: Secondary | ICD-10-CM

## 2016-09-02 DIAGNOSIS — M79674 Pain in right toe(s): Secondary | ICD-10-CM

## 2016-09-02 DIAGNOSIS — I10 Essential (primary) hypertension: Secondary | ICD-10-CM

## 2016-09-02 DIAGNOSIS — E559 Vitamin D deficiency, unspecified: Secondary | ICD-10-CM | POA: Diagnosis not present

## 2016-09-02 DIAGNOSIS — E785 Hyperlipidemia, unspecified: Secondary | ICD-10-CM | POA: Diagnosis not present

## 2016-09-02 LAB — LIPID PANEL
Cholesterol: 160 mg/dL (ref <200)
HDL: 50 mg/dL (ref >40)
LDL Cholesterol: 92 mg/dL (ref <100)
Total CHOL/HDL Ratio: 3.2 Ratio (ref <5.0)
Triglycerides: 90 mg/dL (ref <150)
VLDL: 18 mg/dL (ref <30)

## 2016-09-02 LAB — URIC ACID: Uric Acid, Serum: 5.9 mg/dL (ref 4.0–8.0)

## 2016-09-02 NOTE — Telephone Encounter (Signed)
Lab order placed.

## 2016-09-03 LAB — VITAMIN D 25 HYDROXY (VIT D DEFICIENCY, FRACTURES): Vit D, 25-Hydroxy: 34 ng/mL (ref 30–100)

## 2016-09-12 ENCOUNTER — Other Ambulatory Visit: Payer: Self-pay | Admitting: Family Medicine

## 2016-09-16 ENCOUNTER — Other Ambulatory Visit: Payer: Self-pay

## 2016-09-16 DIAGNOSIS — I10 Essential (primary) hypertension: Secondary | ICD-10-CM

## 2016-09-16 MED ORDER — METOPROLOL SUCCINATE ER 50 MG PO TB24: 50.0000 mg | ORAL_TABLET | Freq: Every day | ORAL | 1 refills | Status: DC

## 2016-10-09 ENCOUNTER — Ambulatory Visit (INDEPENDENT_AMBULATORY_CARE_PROVIDER_SITE_OTHER): Payer: PPO

## 2016-10-09 VITALS — BP 134/84 | HR 72 | Resp 18 | Ht 69.0 in | Wt 210.0 lb

## 2016-10-09 DIAGNOSIS — Z0000 Encounter for general adult medical examination without abnormal findings: Secondary | ICD-10-CM

## 2016-10-09 NOTE — Patient Instructions (Signed)
Thank you for choosing [facility] for your health care needs  The Annual Wellness Visit is designed to allow [place] the chance to assist you in preserving and improving you health.   Preventive Care for Adults  A healthy lifestyle and preventive care can promote health and wellness. Preventive health guidelines for adults include the following key practices.  . A routine yearly physical is a good way to check with your health care provider about your health and preventive screening. It is a chance to share any concerns and updates on your health and to receive a thorough exam.  . Visit your dentist for a routine exam and preventive care every 6 months. Brush your teeth twice a day and floss once a day. Good oral hygiene prevents tooth decay and gum disease.  . The frequency of eye exams is based on your age, health, family medical history, use  of contact lenses, and other factors. Follow your health care provider's ecommendations for frequency of eye exams.  . Eat a healthy diet. Foods like vegetables, fruits, whole grains, low-fat dairy products, and lean protein foods contain the nutrients you need without too many calories. Decrease your intake of foods high in solid fats, added sugars, and salt. Eat the right amount of calories for you. Get information about a proper diet from your health care provider, if necessary.  . Regular physical exercise is one of the most important things you can do for your health. Most adults should get at least 150 minutes of moderate-intensity exercise (any activity that increases your heart rate and causes you to sweat) each week. In addition, most adults need muscle-strengthening exercises on 2 or more days a week.  Silver Sneakers may be a benefit available to you. To determine eligibility, you may visit the website: www.silversneakers.com or contact program at (878)386-5473 Mon-Fri between 8AM-8PM.   . Maintain a healthy weight. The body mass  index (BMI) is a screening tool to identify possible weight problems. It provides an estimate of body fat based on height and weight. Your health care provider can find your BMI and can help you achieve or maintain a healthy weight.   For adults 20 years and older: ? A BMI below 18.5 is considered underweight. ? A BMI of 18.5 to 24.9 is normal. ? A BMI of 25 to 29.9 is considered overweight. ? A BMI of 30 and above is considered obese.   . Maintain normal blood lipids and cholesterol levels by exercising and minimizing your intake of saturated fat. Eat a balanced diet with plenty of fruit and vegetables. Blood tests for lipids and cholesterol should begin at age 13 and be repeated every 5 years. If your lipid or cholesterol levels are high, you are over 50, or you are at high risk for heart disease, you may need your cholesterol levels checked more frequently. Ongoing high lipid and cholesterol levels should be treated with medicines if diet and exercise are not working.  . If you smoke, find out from your health care provider how to quit. If you do not use tobacco, please do not start.  . If you choose to drink alcohol, please do not consume more than 2 drinks per day. One drink is considered to be 12 ounces (355 mL) of beer, 5 ounces (148 mL) of wine, or 1.5 ounces (44 mL) of liquor.  . If you are 15-37 years old, ask your health care provider if you should take aspirin to prevent strokes.  Marland Kitchen  Use sunscreen. Apply sunscreen liberally and repeatedly throughout the day. You should seek shade when your shadow is shorter than you. Protect yourself by wearing long sleeves, pants, a wide-brimmed hat, and sunglasses year round, whenever you are outdoors.  . Once a month, do a whole body skin exam, using a mirror to look at the skin on your back. Tell your health care provider of new moles, moles that have irregular borders, moles that are larger than a pencil eraser, or moles that have changed in shape  or color.  Dr. Moshe Cipro will see you back in April   If you need labs they will be mailed to you

## 2016-10-10 NOTE — Progress Notes (Addendum)
Subjective:   [name] [name] is a 75 y.o. male who presents for Medicare Annual/Subsequent preventive examination.  Cardiac Risk Factors include: advanced age (>31men, >66 women);hypertension;male gender;dyslipidemia;obesity (BMI >30kg/m2)     Objective:    Vitals: BP 134/84   Pulse 72   Resp 18   Ht 5\' 9"  (1.753 m)   Wt 210 lb (95.3 kg)   SpO2 95%   BMI 31.01 kg/m   Body mass index is 31.01 kg/m.  Tobacco History  Smoking Status  . Former Smoker  . Packs/day: 2.00  . Years: 30.00  . Types: Cigarettes  Smokeless Tobacco  . Former Systems developer  . Types: Snuff, Chew       Past Medical History:  Diagnosis Date  . Anemia, iron deficiency   . GERD (gastroesophageal reflux disease)   . H/O hiatal hernia   . Hemorrhoids    and minimal polp  . History of migraines childhood    Flared up in 2009, and have responded to depakote which was stopped in 9 months  . Hx of colonic polyps 02/24/2007  . Hyperlipidemia 2003  . Hypertension 2003  . Lung nodules since 2009   CT scan advised in 08/2010  . Neoplasm    dermal  . Overweight(278.02)    Past Surgical History:  Procedure Laterality Date  . COLONOSCOPY  2008   Fields-mod IH, 72mm polyp sigmoid colon (disappeared w/ insufflation & could not be retrieved)  . COLONOSCOPY  05/19/2012   Procedure: COLONOSCOPY;  Surgeon: [name] Binder, MD;  Location: [facility];  Service: Endoscopy;  Laterality: N/A;  8:30  . ESOPHAGOGASTRODUODENOSCOPY  01/05/2007   Fields-sm HH, benign SB biopsy  . GIVENS CAPSULE STUDY  01/20/2007   Fields-nothing to explain IDA, lymphectasias  . removal of skin cancer twice     Family History  Problem Relation Age of Onset  . Heart defect Mother     pacemaker   . Alzheimer's disease Mother 15    2011  . Heart defect Brother     bradycardia   . Heart failure Father   . Colon cancer Neg Hx    History  Sexual Activity  . Sexual activity: Yes    Outpatient Encounter Prescriptions as of 10/09/2016    Medication Sig  . aspirin (ASPIRIN LOW DOSE) 81 MG EC tablet Take 81 mg by mouth every morning.   . butalbital-acetaminophen-caffeine (ESGIC) 50-325-40 MG per tablet Take 1 tablet by mouth every 6 (six) hours as needed for headache. No more than twice weekly  . Krill Oil 300 MG CAPS Take 1 capsule by mouth daily.  Marland Kitchen loratadine (CLARITIN) 10 MG tablet Take 10 mg by mouth daily.  . metoprolol succinate (TOPROL-XL) 50 MG 24 hr tablet Take 1 tablet (50 mg total) by mouth daily. Take with or immediately following a meal.  . Misc Natural Products (BLACK CHERRY CONCENTRATE PO) Take by mouth.  . Multiple Vitamin (MULTIVITAMIN WITH MINERALS) TABS Take 1 tablet by mouth daily.  Marland Kitchen omeprazole (PRILOSEC) 20 MG capsule TAKE ONE CAPSULE BY MOUTH ONCE DAILY IN THE MORNING  . simvastatin (ZOCOR) 40 MG tablet TAKE ONE TABLET BY MOUTH AT BEDTIME  . terazosin (HYTRIN) 2 MG capsule TAKE ONE CAPSULE BY MOUTH AT BEDTIME  . [DISCONTINUED] indomethacin (INDOCIN) 25 MG capsule One capsule three times daily for 2 days, then one capsule twice daily for 2 days , then one capsule once daily for 3 days, then stop  . [DISCONTINUED] simvastatin (ZOCOR) 40  MG tablet TAKE ONE TABLET BY MOUTH EVERY DAY AT BEDTIME   No facility-administered encounter medications on file as of 10/09/2016.     Activities of Daily Living In your present state of health, do you have any difficulty performing the following activities: 10/10/2016 10/09/2016  Hearing? - N  Vision? - N  Difficulty concentrating or making decisions? - N  Walking or climbing stairs? - N  Dressing or bathing? - N  Doing errands, shopping? - N  Conservation officer, nature and eating ? N -  Using the Toilet? N -  In the past six months, have you accidently leaked urine? N -  Do you have problems with loss of bowel control? N -  Managing your Medications? N -  Managing your Finances? N -  Housekeeping or managing your Housekeeping? N -  Some recent data might be hidden     Patient Care Team: [name] Helper, MD as PCP - General [name] Binder, MD (Gastroenterology) [name] Craze, MD as Referring Physician (Dermatology)   Assessment:    Exercise Activities and Dietary recommendations Current Exercise Habits: The patient does not participate in regular exercise at present (Does do regular yardwork)  Goals    None     Fall Risk Fall Risk  10/09/2016 08/18/2016 10/08/2015 09/27/2015 05/01/2014  Falls in the past year? No No No No No   Depression Screen PHQ 2/9 Scores 10/09/2016 08/18/2016 05/01/2014  PHQ - 2 Score 0 0 2  PHQ- 9 Score - - 7    Cognitive Function     6CIT Screen 10/10/2016  What Year? 0 points  What month? 0 points  What time? 0 points  Count back from 20 0 points  Months in reverse 0 points  Repeat phrase 0 points  Total Score 0    Immunization History  Administered Date(s) Administered  . Influenza Whole 08/12/2010, 08/11/2011  . Influenza,inj,Quad PF,36+ Mos 07/27/2013, 07/27/2014, 08/07/2015, 09/01/2016  . Pneumococcal Conjugate-13 05/01/2014  . Pneumococcal Polysaccharide-23 04/29/2013  . Tdap 08/11/2011  . Zoster 04/10/2014   Screening Tests Health Maintenance  Topic Date Due  . TETANUS/TDAP  08/10/2021  . COLONOSCOPY  05/19/2022  . INFLUENZA VACCINE  Completed  . ZOSTAVAX  Completed  . PNA vac Low Risk Adult  Completed      Plan:    Medicare annual wellness visit, subsequent Annual exam as documented. Counseling done  re healthy lifestyle involving commitment to 150 minutes exercise per week, heart healthy diet, and attaining healthy weight.The importance of adequate sleep also discussed.  Immunization and cancer screening needs are specifically addressed at this visit.    During the course of the visit the patient was educated and counseled about the following appropriate screening and preventive services:   Vaccines to include Pneumoccal, Influenza, Hepatitis B, Td, Zostavax,  HCV  Electrocardiogram  Cardiovascular Disease  Colorectal cancer screening  Diabetes screening  Prostate Cancer Screening  Glaucoma screening  Nutrition counseling   Smoking cessation counseling  Patient Instructions (the written plan) was given to the patient.    [name] George [place], [place]  [phone]

## 2016-10-12 ENCOUNTER — Telehealth: Payer: Self-pay | Admitting: Family Medicine

## 2016-10-12 NOTE — Assessment & Plan Note (Signed)
Annual exam as documented. Counseling done  re healthy lifestyle involving commitment to 150 minutes exercise per week, heart healthy diet, and attaining healthy weight.The importance of adequate sleep also discussed.  Immunization and cancer screening needs are specifically addressed at this visit.  

## 2016-10-12 NOTE — Telephone Encounter (Signed)
Pt scheduled for f/u April 16, I will be out that date, pls reschedule for early May, he will need labs and I will send message to nurse also regarding the labs, thansk

## 2016-10-15 ENCOUNTER — Encounter: Payer: Self-pay | Admitting: Family Medicine

## 2016-10-15 NOTE — Telephone Encounter (Signed)
[name] [name] appointment has been rescheduled to May 01st at 8:40 and a new appointment card will be mailed

## 2016-10-17 ENCOUNTER — Telehealth: Payer: Self-pay

## 2016-10-17 DIAGNOSIS — E785 Hyperlipidemia, unspecified: Secondary | ICD-10-CM

## 2016-10-17 DIAGNOSIS — I10 Essential (primary) hypertension: Secondary | ICD-10-CM

## 2016-10-17 NOTE — Telephone Encounter (Signed)
Labs prior to appt

## 2016-10-22 DIAGNOSIS — L570 Actinic keratosis: Secondary | ICD-10-CM | POA: Diagnosis not present

## 2016-10-22 DIAGNOSIS — Z85828 Personal history of other malignant neoplasm of skin: Secondary | ICD-10-CM | POA: Diagnosis not present

## 2016-11-06 ENCOUNTER — Other Ambulatory Visit: Payer: Self-pay | Admitting: Family Medicine

## 2016-11-24 ENCOUNTER — Emergency Department (HOSPITAL_COMMUNITY)
Admission: EM | Admit: 2016-11-24 | Discharge: 2016-11-24 | Disposition: A | Discharge status: Home / Self Care | Payer: PPO | Attending: Emergency Medicine | Admitting: Emergency Medicine

## 2016-11-24 ENCOUNTER — Encounter (HOSPITAL_COMMUNITY): Payer: Self-pay | Admitting: Emergency Medicine

## 2016-11-24 ENCOUNTER — Emergency Department (HOSPITAL_COMMUNITY): Payer: PPO

## 2016-11-24 ENCOUNTER — Telehealth: Payer: Self-pay

## 2016-11-24 DIAGNOSIS — Z7982 Long term (current) use of aspirin: Secondary | ICD-10-CM | POA: Insufficient documentation

## 2016-11-24 DIAGNOSIS — Z87891 Personal history of nicotine dependence: Secondary | ICD-10-CM | POA: Diagnosis not present

## 2016-11-24 DIAGNOSIS — R109 Unspecified abdominal pain: Secondary | ICD-10-CM | POA: Diagnosis not present

## 2016-11-24 DIAGNOSIS — N200 Calculus of kidney: Secondary | ICD-10-CM | POA: Insufficient documentation

## 2016-11-24 DIAGNOSIS — Z79899 Other long term (current) drug therapy: Secondary | ICD-10-CM | POA: Diagnosis not present

## 2016-11-24 DIAGNOSIS — N23 Unspecified renal colic: Secondary | ICD-10-CM | POA: Diagnosis not present

## 2016-11-24 DIAGNOSIS — I10 Essential (primary) hypertension: Secondary | ICD-10-CM | POA: Insufficient documentation

## 2016-11-24 LAB — BASIC METABOLIC PANEL
Anion gap: 8 (ref 5–15)
BUN: 20 mg/dL (ref 6–20)
CO2: 26 mmol/L (ref 22–32)
Calcium: 8.8 mg/dL — ABNORMAL LOW (ref 8.9–10.3)
Chloride: 103 mmol/L (ref 101–111)
Creatinine, Ser: 1.99 mg/dL — ABNORMAL HIGH (ref 0.61–1.24)
GFR calc Af Amer: 36 mL/min — ABNORMAL LOW (ref >60)
GFR calc non Af Amer: 31 mL/min — ABNORMAL LOW (ref >60)
Glucose, Bld: 101 mg/dL — ABNORMAL HIGH (ref 65–99)
Potassium: 4.3 mmol/L (ref 3.5–5.1)
Sodium: 137 mmol/L (ref 135–145)

## 2016-11-24 LAB — CBC
HCT: 39.3 % (ref 39.0–52.0)
Hemoglobin: 13.3 g/dL (ref 13.0–17.0)
MCH: 29.4 pg (ref 26.0–34.0)
MCHC: 33.8 g/dL (ref 30.0–36.0)
MCV: 86.8 fL (ref 78.0–100.0)
Platelets: 177 10*3/uL (ref 150–400)
RBC: 4.53 MIL/uL (ref 4.22–5.81)
RDW: 14.3 % (ref 11.5–15.5)
WBC: 9.8 10*3/uL (ref 4.0–10.5)

## 2016-11-24 LAB — URINALYSIS, ROUTINE W REFLEX MICROSCOPIC
Bilirubin Urine: NEGATIVE
Glucose, UA: NEGATIVE mg/dL
Hgb urine dipstick: NEGATIVE
Ketones, ur: NEGATIVE mg/dL
Leukocytes, UA: NEGATIVE
Nitrite: NEGATIVE
Protein, ur: NEGATIVE mg/dL
Specific Gravity, Urine: 1.02 (ref 1.005–1.030)
pH: 6 (ref 5.0–8.0)

## 2016-11-24 MED ORDER — HYDROMORPHONE HCL 1 MG/ML IJ SOLN
0.5000 mg | Freq: Once | INTRAMUSCULAR | Status: AC
  Administered 2016-11-24: 0.5 mg via INTRAVENOUS
  Filled 2016-11-24: qty 1

## 2016-11-24 MED ORDER — OXYCODONE-ACETAMINOPHEN 5-325 MG PO TABS: 1.0000 | ORAL_TABLET | Freq: Four times a day (QID) | ORAL | 0 refills | Status: DC | PRN

## 2016-11-24 MED ORDER — TAMSULOSIN HCL 0.4 MG PO CAPS: 0.4000 mg | ORAL_CAPSULE | Freq: Every day | ORAL | 0 refills | Status: DC

## 2016-11-24 MED ORDER — ONDANSETRON HCL 4 MG PO TABS: 4.0000 mg | ORAL_TABLET | Freq: Four times a day (QID) | ORAL | 0 refills | Status: DC

## 2016-11-24 MED ORDER — ONDANSETRON HCL 4 MG/2ML IJ SOLN
4.0000 mg | Freq: Once | INTRAMUSCULAR | Status: AC
  Administered 2016-11-24: 4 mg via INTRAVENOUS
  Filled 2016-11-24: qty 2

## 2016-11-24 NOTE — ED Triage Notes (Signed)
Flank pain intermittent since last Tuesday, worse today. Pt also constipated, last BM 5 days ago.

## 2016-11-24 NOTE — Telephone Encounter (Signed)
Wife came to office.   Advised her that patient should go the ED for evaluation due the uncertainty of symptoms

## 2016-11-24 NOTE — ED Provider Notes (Signed)
[facility] DEPT Provider Note   CSN: [id number] Arrival date & time: 11/24/16  1602   By signing my name below, I, Collene Leyden, attest that this documentation has been prepared under the direction and in the presence of Lily Kocher PA-C Electronically Signed: Collene Leyden, Scribe. 11/24/16. 5:34 PM.  History   Chief Complaint Chief Complaint  Patient presents with  . Flank Pain  . Constipation    HPI Comments: [name] [name] is a 76 y.o. male with a hx of GERD, HLD, and HTN, who presents to the Emergency Department complaining of intermittent flank pain that began 6 days ago. Patient has associated constipation, chills, and "dry heaving". States he has not had a bowel movement in 5 days. Patient states his pain is worse when lying down too long or sitting up. Wife reports giving the patient suppositories and miralax with minimal improvement. Patient denies any vomiting or fever.   HPI  Past Medical History:  Diagnosis Date  . Anemia, iron deficiency   . GERD (gastroesophageal reflux disease)   . H/O hiatal hernia   . Hemorrhoids    and minimal polp  . History of migraines childhood    Flared up in 2009, and have responded to depakote which was stopped in 9 months  . Hx of colonic polyps 02/24/2007  . Hyperlipidemia 2003  . Hypertension 2003  . Lung nodules since 2009   CT scan advised in 08/2010  . Neoplasm    dermal  . Overweight(278.02)     Patient Active Problem List   Diagnosis Date Noted  . IGT (impaired glucose tolerance) 10/01/2014  . Frequent headaches 09/25/2014  . GERD (gastroesophageal reflux disease) 11/14/2013  . Medicare annual wellness visit, subsequent 04/30/2013  . Colon polyp 04/12/2012  . Obesity (BMI 30.0-34.9) 12/15/2010  . Allergic rhinitis 12/15/2010  . LUNG NODULE 08/18/2010  . ELECTROCARDIOGRAM, ABNORMAL 08/18/2010  . Nonspecific abnormal findings on radiological and other examination of skull and head 08/18/2010  . Hyperlipemia  07/23/2010  . Essential hypertension 07/23/2010    Past Surgical History:  Procedure Laterality Date  . COLONOSCOPY  2008   Fields-mod IH, 39mm polyp sigmoid colon (disappeared w/ insufflation & could not be retrieved)  . COLONOSCOPY  05/19/2012   Procedure: COLONOSCOPY;  Surgeon: Danie Binder, MD;  Location: [facility];  Service: Endoscopy;  Laterality: N/A;  8:30  . ESOPHAGOGASTRODUODENOSCOPY  01/05/2007   Fields-sm HH, benign SB biopsy  . GIVENS CAPSULE STUDY  01/20/2007   Fields-nothing to explain IDA, lymphectasias  . removal of skin cancer twice         Home Medications    Prior to Admission medications   Medication Sig Start Date End Date Taking? Authorizing Provider  aspirin (ASPIRIN LOW DOSE) 81 MG EC tablet Take 81 mg by mouth every morning.     Historical Provider, MD  butalbital-acetaminophen-caffeine (ESGIC) 50-325-40 MG per tablet Take 1 tablet by mouth every 6 (six) hours as needed for headache. No more than twice weekly 10/18/14   Fayrene Helper, MD  Krill Oil 300 MG CAPS Take 1 capsule by mouth daily.    Historical Provider, MD  loratadine (CLARITIN) 10 MG tablet Take 10 mg by mouth daily.    Historical Provider, MD  metoprolol succinate (TOPROL-XL) 50 MG 24 hr tablet Take 1 tablet (50 mg total) by mouth daily. Take with or immediately following a meal. 09/16/16   Fayrene Helper, MD  Misc Natural Products (BLACK CHERRY CONCENTRATE PO) Take  by mouth.    Historical Provider, MD  Multiple Vitamin (MULTIVITAMIN WITH MINERALS) TABS Take 1 tablet by mouth daily.    Historical Provider, MD  omeprazole (PRILOSEC) 20 MG capsule TAKE ONE CAPSULE BY MOUTH ONCE DAILY IN THE MORNING 08/21/16   Fayrene Helper, MD  simvastatin (ZOCOR) 40 MG tablet TAKE ONE TABLET BY MOUTH AT BEDTIME 09/12/16   Fayrene Helper, MD  terazosin (HYTRIN) 2 MG capsule TAKE ONE CAPSULE BY MOUTH AT BEDTIME 11/07/16   Fayrene Helper, MD    Family History Family History  Problem  Relation Age of Onset  . Heart defect Mother     pacemaker   . Alzheimer's disease Mother 60    2011  . Heart defect Brother     bradycardia   . Heart failure Father   . Colon cancer Neg Hx     Social History Social History  Substance Use Topics  . Smoking status: Former Smoker    Packs/day: 2.00    Years: 30.00    Types: Cigarettes  . Smokeless tobacco: Former Systems developer    Types: Snuff, Chew  . Alcohol use No     Allergies   Ace inhibitors   Review of Systems Review of Systems  Constitutional: Positive for chills.  Gastrointestinal: Positive for diarrhea. Negative for vomiting.  Genitourinary: Positive for flank pain.  All other systems reviewed and are negative.    Physical Exam Updated Vital Signs BP (!) 159/102 (BP Location: Left Arm)   Pulse 90   Temp 98.1 F (36.7 C) (Oral)   Resp 20   Ht 5\' 10"  (1.778 m)   Wt 210 lb (95.3 kg)   SpO2 99%   BMI 30.13 kg/m   Physical Exam  Constitutional: He is oriented to person, place, and time. He appears well-developed.  HENT:  Head: Normocephalic and atraumatic.  Mouth/Throat: Oropharynx is clear and moist.  Airway patent.   Eyes: Conjunctivae and EOM are normal. Pupils are equal, round, and reactive to light.  Neck: Normal range of motion. Neck supple.  Cardiovascular: Normal rate, regular rhythm and normal heart sounds.   Pulmonary/Chest: Effort normal and breath sounds normal.  symmetrical rise and fall of the chest. Lungs clear.   Abdominal: Soft. Bowel sounds are normal.  Musculoskeletal: Normal range of motion.  No lower extremity edema.   Neurological: He is alert and oriented to person, place, and time.  Skin: Skin is warm and dry.  Psychiatric: He has a normal mood and affect.     ED Treatments / Results  DIAGNOSTIC STUDIES: Oxygen Saturation is 99% on RA, normal by my interpretation.    COORDINATION OF CARE: 5:34 PM Discussed treatment plan with pt at bedside and pt agreed to plan, which  includes pain medication, urine sample, and imaging.   Labs (all labs ordered are listed, but only abnormal results are displayed) Labs Reviewed  URINALYSIS, [place] PANEL  CBC    EKG  EKG Interpretation None       Radiology No results found.  Procedures Procedures (including critical care time)  Medications Ordered in ED Medications - No data to display   Initial Impression / Assessment and Plan / ED Course  Pt seen with me by Dr Lacinda Axon.  I have reviewed the triage vital signs and the nursing notes.  Pertinent labs & imaging results that were available during my care of the patient were reviewed by me and considered in my medical  decision making (see chart for details).     *I have reviewed nursing notes, vital signs, and all appropriate lab and imaging results for this patient.**  Final Clinical Impressions(s) / ED Diagnoses   MDM: Pain for the last 6 days. Hx of constipation and kidney stones. Patient will be treated with medication for pain and nausea, will obtain urinalysis and CT scan.   7:03 PM  Symptoms of renal colic improved after iV pain medication. Nausea resolved. CT scan suggests 7 mm kidney stone. Patient will be referred to Dr. Junious Silk for urology consultation. Prescription for percocet and zofran given to the patient. We also discueess the use of fleets mineral oil enema for assistance with constipation. Patient and family in agreement with this plan.   Final diagnoses:  Renal colic on left side  Kidney stone on left side    New Prescriptions New Prescriptions   ONDANSETRON (ZOFRAN) 4 MG TABLET    Take 1 tablet (4 mg total) by mouth every 6 (six) hours.   OXYCODONE-ACETAMINOPHEN (PERCOCET/ROXICET) 5-325 MG TABLET    Take 1 tablet by mouth every 6 (six) hours as needed.   TAMSULOSIN (FLOMAX) 0.4 MG CAPS CAPSULE    Take 1 capsule (0.4 mg total) by mouth daily. Take at hs.   **I personally performed the  services described in this documentation, which was scribed in my presence. The recorded information has been reviewed and is accurate.    Lily Kocher, PA-C 11/24/16 [place], MD 11/25/16 [phone]

## 2016-11-24 NOTE — ED Notes (Signed)
Pt given 0.5 mg of Dilaudid. Ricardo Jericho RN verified waste of 0.5mg  Dilaudid. Went to go waste in [place] but pt would not pull up due to pt being discharged. Oswald Hillock, RN

## 2016-11-24 NOTE — Discharge Instructions (Signed)
You have a 7 mm kidney stone on the left. Please strain all urine. Please see Dr.Eskridge for urology evaluation and management of the stone. Use Zofran for nausea. Use Percocet for pain. Use Flomax to assist stopping the spasm and colic from the kidney stone.  Please use flavored mineral oral and fleets mineral oil enema daily until constipation issue has resolved. You may also want to use natural remedies such as chocolate pudding, hot coffee, hot cocoa, etc. Please return to the emergency department if any changes, problems, or concerns.

## 2016-11-25 ENCOUNTER — Telehealth: Payer: Self-pay | Admitting: Family Medicine

## 2016-11-25 DIAGNOSIS — N138 Other obstructive and reflux uropathy: Secondary | ICD-10-CM

## 2016-11-25 DIAGNOSIS — N200 Calculus of kidney: Principal | ICD-10-CM

## 2016-11-25 NOTE — Telephone Encounter (Signed)
All made to home and spoke with sppuse re  Patient's condition. Denies any further pain from kidney stone, last oxycodone was early this am C/o no BM for over 5 days, has used max dose of mineral oil and one suppository no relief, I advised another suppository and use of prunes/ prune joice, bran ceral, greens, all and any food that helps with BM. Offered an abd x ray as she stated he was not passing gas, no vomiting reported, Chooses to call in am if still no BM I explained needs urology f/u due to obstructing kidney stone , I will refer to Dr Jeffie Pollock in [place]

## 2016-11-25 NOTE — Telephone Encounter (Signed)
I spoke with spouse the day after pt was in the ED and this is recorded in a separate message

## 2016-11-26 ENCOUNTER — Emergency Department (HOSPITAL_COMMUNITY): Payer: PPO

## 2016-11-26 ENCOUNTER — Encounter (HOSPITAL_COMMUNITY): Payer: Self-pay | Admitting: Cardiology

## 2016-11-26 ENCOUNTER — Inpatient Hospital Stay (HOSPITAL_COMMUNITY)
Admission: EM | Admit: 2016-11-26 | Discharge: 2016-11-28 | DRG: 660 | Disposition: A | Discharge status: Home / Self Care | Payer: PPO | Attending: Family Medicine | Admitting: Family Medicine

## 2016-11-26 ENCOUNTER — Ambulatory Visit (INDEPENDENT_AMBULATORY_CARE_PROVIDER_SITE_OTHER): Payer: PPO | Admitting: Family Medicine

## 2016-11-26 DIAGNOSIS — N400 Enlarged prostate without lower urinary tract symptoms: Secondary | ICD-10-CM

## 2016-11-26 DIAGNOSIS — Z87891 Personal history of nicotine dependence: Secondary | ICD-10-CM | POA: Diagnosis not present

## 2016-11-26 DIAGNOSIS — N3289 Other specified disorders of bladder: Secondary | ICD-10-CM | POA: Diagnosis present

## 2016-11-26 DIAGNOSIS — R1030 Lower abdominal pain, unspecified: Secondary | ICD-10-CM

## 2016-11-26 DIAGNOSIS — R112 Nausea with vomiting, unspecified: Secondary | ICD-10-CM | POA: Diagnosis not present

## 2016-11-26 DIAGNOSIS — N132 Hydronephrosis with renal and ureteral calculous obstruction: Principal | ICD-10-CM | POA: Diagnosis present

## 2016-11-26 DIAGNOSIS — I10 Essential (primary) hypertension: Secondary | ICD-10-CM | POA: Diagnosis not present

## 2016-11-26 DIAGNOSIS — N201 Calculus of ureter: Secondary | ICD-10-CM

## 2016-11-26 DIAGNOSIS — E860 Dehydration: Secondary | ICD-10-CM | POA: Diagnosis not present

## 2016-11-26 DIAGNOSIS — K219 Gastro-esophageal reflux disease without esophagitis: Secondary | ICD-10-CM | POA: Diagnosis not present

## 2016-11-26 DIAGNOSIS — Z7982 Long term (current) use of aspirin: Secondary | ICD-10-CM | POA: Diagnosis not present

## 2016-11-26 DIAGNOSIS — E785 Hyperlipidemia, unspecified: Secondary | ICD-10-CM | POA: Diagnosis present

## 2016-11-26 DIAGNOSIS — N281 Cyst of kidney, acquired: Secondary | ICD-10-CM | POA: Diagnosis not present

## 2016-11-26 DIAGNOSIS — K449 Diaphragmatic hernia without obstruction or gangrene: Secondary | ICD-10-CM | POA: Diagnosis not present

## 2016-11-26 DIAGNOSIS — N179 Acute kidney failure, unspecified: Secondary | ICD-10-CM | POA: Diagnosis present

## 2016-11-26 DIAGNOSIS — K5900 Constipation, unspecified: Secondary | ICD-10-CM | POA: Diagnosis not present

## 2016-11-26 DIAGNOSIS — N1330 Unspecified hydronephrosis: Secondary | ICD-10-CM | POA: Diagnosis present

## 2016-11-26 DIAGNOSIS — N23 Unspecified renal colic: Secondary | ICD-10-CM

## 2016-11-26 DIAGNOSIS — R109 Unspecified abdominal pain: Secondary | ICD-10-CM | POA: Diagnosis not present

## 2016-11-26 LAB — CBC WITH DIFFERENTIAL/PLATELET
Basophils Absolute: 0 10*3/uL (ref 0.0–0.1)
Basophils Relative: 0 %
Eosinophils Absolute: 0.1 10*3/uL (ref 0.0–0.7)
Eosinophils Relative: 1 %
HCT: 41.8 % (ref 39.0–52.0)
Hemoglobin: 14 g/dL (ref 13.0–17.0)
Lymphocytes Relative: 14 %
Lymphs Abs: 1.7 10*3/uL (ref 0.7–4.0)
MCH: 29.4 pg (ref 26.0–34.0)
MCHC: 33.5 g/dL (ref 30.0–36.0)
MCV: 87.6 fL (ref 78.0–100.0)
Monocytes Absolute: 1.5 10*3/uL — ABNORMAL HIGH (ref 0.1–1.0)
Monocytes Relative: 12 %
Neutro Abs: 9 10*3/uL — ABNORMAL HIGH (ref 1.7–7.7)
Neutrophils Relative %: 73 %
Platelets: 203 10*3/uL (ref 150–400)
RBC: 4.77 MIL/uL (ref 4.22–5.81)
RDW: 14.2 % (ref 11.5–15.5)
WBC: 12.3 10*3/uL — ABNORMAL HIGH (ref 4.0–10.5)

## 2016-11-26 LAB — URINALYSIS, ROUTINE W REFLEX MICROSCOPIC
Bacteria, UA: NONE SEEN
Bilirubin Urine: NEGATIVE
Glucose, UA: NEGATIVE mg/dL
Ketones, ur: 5 mg/dL — AB
Leukocytes, UA: NEGATIVE
Nitrite: NEGATIVE
Protein, ur: NEGATIVE mg/dL
Specific Gravity, Urine: 1.018 (ref 1.005–1.030)
pH: 5 (ref 5.0–8.0)

## 2016-11-26 LAB — COMPREHENSIVE METABOLIC PANEL
ALT: 17 U/L (ref 17–63)
AST: 23 U/L (ref 15–41)
Albumin: 4.4 g/dL (ref 3.5–5.0)
Alkaline Phosphatase: 52 U/L (ref 38–126)
Anion gap: 10 (ref 5–15)
BUN: 25 mg/dL — ABNORMAL HIGH (ref 6–20)
CO2: 26 mmol/L (ref 22–32)
Calcium: 9.3 mg/dL (ref 8.9–10.3)
Chloride: 102 mmol/L (ref 101–111)
Creatinine, Ser: 2.23 mg/dL — ABNORMAL HIGH (ref 0.61–1.24)
GFR calc Af Amer: 32 mL/min — ABNORMAL LOW (ref >60)
GFR calc non Af Amer: 27 mL/min — ABNORMAL LOW (ref >60)
Glucose, Bld: 101 mg/dL — ABNORMAL HIGH (ref 65–99)
Potassium: 4.5 mmol/L (ref 3.5–5.1)
Sodium: 138 mmol/L (ref 135–145)
Total Bilirubin: 0.8 mg/dL (ref 0.3–1.2)
Total Protein: 7.7 g/dL (ref 6.5–8.1)

## 2016-11-26 LAB — LIPASE, BLOOD: Lipase: 24 U/L (ref 11–51)

## 2016-11-26 MED ORDER — ACETAMINOPHEN 650 MG RE SUPP: 650.0000 mg | Freq: Four times a day (QID) | RECTAL | Status: DC | PRN

## 2016-11-26 MED ORDER — ASPIRIN EC 81 MG PO TBEC
81.0000 mg | DELAYED_RELEASE_TABLET | Freq: Every morning | ORAL | Status: DC
  Administered 2016-11-27 – 2016-11-28 (×2): 81 mg via ORAL
  Filled 2016-11-26 (×2): qty 1

## 2016-11-26 MED ORDER — FENTANYL CITRATE (PF) 100 MCG/2ML IJ SOLN
25.0000 ug | INTRAMUSCULAR | Status: DC | PRN
  Administered 2016-11-26 – 2016-11-27 (×5): 50 ug via INTRAVENOUS
  Filled 2016-11-26 (×7): qty 2

## 2016-11-26 MED ORDER — FENTANYL CITRATE (PF) 100 MCG/2ML IJ SOLN
25.0000 ug | INTRAMUSCULAR | Status: AC | PRN
  Administered 2016-11-26 (×2): 25 ug via INTRAVENOUS
  Filled 2016-11-26 (×2): qty 2

## 2016-11-26 MED ORDER — TAMSULOSIN HCL 0.4 MG PO CAPS
0.4000 mg | ORAL_CAPSULE | Freq: Every day | ORAL | Status: DC
  Administered 2016-11-26 – 2016-11-27 (×2): 0.4 mg via ORAL
  Filled 2016-11-26 (×2): qty 1

## 2016-11-26 MED ORDER — FENTANYL CITRATE (PF) 100 MCG/2ML IJ SOLN
50.0000 ug | INTRAMUSCULAR | Status: AC | PRN
  Administered 2016-11-26 (×2): 50 ug via INTRAVENOUS
  Filled 2016-11-26 (×2): qty 2

## 2016-11-26 MED ORDER — FENTANYL CITRATE (PF) 100 MCG/2ML IJ SOLN
50.0000 ug | Freq: Once | INTRAMUSCULAR | Status: AC
  Administered 2016-11-26: 50 ug via INTRAVENOUS

## 2016-11-26 MED ORDER — METOPROLOL SUCCINATE ER 50 MG PO TB24
50.0000 mg | ORAL_TABLET | Freq: Every day | ORAL | Status: DC
  Administered 2016-11-26 – 2016-11-28 (×3): 50 mg via ORAL
  Filled 2016-11-26 (×3): qty 1

## 2016-11-26 MED ORDER — SIMVASTATIN 40 MG PO TABS
40.0000 mg | ORAL_TABLET | Freq: Every day | ORAL | Status: DC
Start: 2016-11-26 — End: 2016-11-28
  Administered 2016-11-26 – 2016-11-27 (×2): 40 mg via ORAL
  Filled 2016-11-26 (×2): qty 1

## 2016-11-26 MED ORDER — ONDANSETRON HCL 4 MG/2ML IJ SOLN
4.0000 mg | INTRAMUSCULAR | Status: DC | PRN
  Administered 2016-11-26: 4 mg via INTRAVENOUS
  Filled 2016-11-26: qty 2

## 2016-11-26 MED ORDER — SODIUM CHLORIDE 0.9 % IV SOLN
INTRAVENOUS | Status: DC
  Administered 2016-11-26 – 2016-11-27 (×3): via INTRAVENOUS

## 2016-11-26 MED ORDER — MORPHINE SULFATE (PF) 4 MG/ML IV SOLN
4.0000 mg | INTRAVENOUS | Status: AC | PRN
  Administered 2016-11-26 (×2): 4 mg via INTRAVENOUS
  Filled 2016-11-26 (×2): qty 1

## 2016-11-26 MED ORDER — CEFTRIAXONE SODIUM 1 G IJ SOLR
1.0000 g | INTRAMUSCULAR | Status: DC
  Administered 2016-11-26 – 2016-11-27 (×2): 1 g via INTRAVENOUS
  Filled 2016-11-26 (×2): qty 10

## 2016-11-26 MED ORDER — ONDANSETRON HCL 4 MG/2ML IJ SOLN
4.0000 mg | Freq: Four times a day (QID) | INTRAMUSCULAR | Status: DC | PRN
  Administered 2016-11-27: 4 mg via INTRAVENOUS
  Filled 2016-11-26: qty 2

## 2016-11-26 MED ORDER — PANTOPRAZOLE SODIUM 40 MG PO TBEC
40.0000 mg | DELAYED_RELEASE_TABLET | Freq: Every day | ORAL | Status: DC
  Administered 2016-11-26 – 2016-11-28 (×3): 40 mg via ORAL
  Filled 2016-11-26 (×3): qty 1

## 2016-11-26 MED ORDER — MILK AND MOLASSES ENEMA
1.0000 | Freq: Once | RECTAL | Status: AC
Start: 2016-11-26 — End: 2016-11-26
  Administered 2016-11-26: 250 mL via RECTAL
  Filled 2016-11-26: qty 250

## 2016-11-26 MED ORDER — ONDANSETRON HCL 4 MG PO TABS: 4.0000 mg | ORAL_TABLET | Freq: Four times a day (QID) | ORAL | Status: DC | PRN

## 2016-11-26 MED ORDER — ACETAMINOPHEN 325 MG PO TABS
650.0000 mg | ORAL_TABLET | Freq: Four times a day (QID) | ORAL | Status: DC | PRN
  Administered 2016-11-27: 650 mg via ORAL
  Filled 2016-11-26: qty 2

## 2016-11-26 NOTE — ED Notes (Signed)
Placed on 2 liters [place] oxygen. sats up to 100%

## 2016-11-26 NOTE — ED Triage Notes (Signed)
Left flank pain since last week.  Seen here with same Monday  and diagnosed with 7 mm stone.  Was to see urologist.   Also states bowels not moving.

## 2016-11-26 NOTE — Patient Instructions (Signed)
You need to go directly to the ED , I have spoken with MD there

## 2016-11-26 NOTE — ED Notes (Signed)
Daughter out at nurses station and states her dad is hurting real bad again.  In to see pt and pt moaning in pain.  Dr. Roderic Palau notified and ok to give 50 mcg fentanyl IV now.

## 2016-11-26 NOTE — Consult Note (Signed)
Urology Consult  Referring physician: K McManus Reason for referral: Kidney stone  Chief Complaint: kidney stone  History of Present Illness: Elderly male seen Feb 5 and diagnosed with kidney stone; had pain and significant constipation; did not f/up yet with Dr Eskridge; returned today with pain and nausea and vomiting and reimaged  CT scan:  5th: 7x5 mm stone at left UPJ; moderate hydro; bilateral non-obstr stones;  Today: same findings and stranding along upper ureter and kidney and no distal stone; large prostate noted  Previous stone history; no GU surgery; rare UTI; Nx3; flow reasonable  Pain severe thru the night treated   Modifying factors: There are no other modifying factors  Associated signs and symptoms: There are no other associated signs and symptoms Aggravating and relieving factors: There are no other aggravating or relieving factors Severity: Moderate Duration: Persistent  Cr 2.23 (baseline 1.27)  Past Medical History:  Diagnosis Date  . Anemia, iron deficiency   . GERD (gastroesophageal reflux disease)   . H/O hiatal hernia   . Hemorrhoids    and minimal polp  . History of migraines childhood    Flared up in 2009, and have responded to depakote which was stopped in 9 months  . Hx of colonic polyps 02/24/2007  . Hyperlipidemia 2003  . Hypertension 2003  . Lung nodules since 2009   CT scan advised in 08/2010  . Neoplasm    dermal  . Overweight(278.02)    Past Surgical History:  Procedure Laterality Date  . COLONOSCOPY  2008   Fields-mod IH, 3mm polyp sigmoid colon (disappeared w/ insufflation & could not be retrieved)  . COLONOSCOPY  05/19/2012   Procedure: COLONOSCOPY;  Surgeon: Sandi L Fields, MD;  Location: [facility];  Service: Endoscopy;  Laterality: N/A;  8:30  . ESOPHAGOGASTRODUODENOSCOPY  01/05/2007   Fields-sm HH, benign SB biopsy  . GIVENS CAPSULE STUDY  01/20/2007   Fields-nothing to explain IDA, lymphectasias  . removal of skin cancer twice       Medications: I have reviewed the patient's current medications. Allergies:  Allergies  Allergen Reactions  . Ace Inhibitors Cough    Family member not sure if he is allergic or not.    Family History  Problem Relation Age of Onset  . Heart defect Mother     pacemaker   . Alzheimer's disease Mother 83    2011  . Heart defect Brother     bradycardia   . Heart failure Father   . Colon cancer Neg Hx    Social History:  reports that he has quit smoking. His smoking use included Cigarettes. He has a 60.00 pack-year smoking history. He has quit using smokeless tobacco. His smokeless tobacco use included Snuff and Chew. He reports that he does not drink alcohol or use drugs.  ROS: All systems are reviewed and negative except as noted. Rest OK  Physical Exam:  Vital signs in last 24 hours: Pulse Rate:  [69-81] 81 (02/07 1200) BP: (133-147)/(79-94) 133/79 (02/07 1200) SpO2:  [94 %-100 %] 99 % (02/07 1200)  Cardiovascular: Skin warm; not flushed Respiratory: Breaths quiet; no shortness of breath Abdomen: No masses Neurological: Normal sensation to touch Musculoskeletal: Normal motor function arms and legs Lymphatics: No inguinal adenopathy Skin: No rashes Genitourinary:non-toxic/ genitalia normal  Laboratory Data:  Results for orders placed or performed during the hospital encounter of 11/26/16 (from the past 72 hour(s))  Comprehensive metabolic panel     Status: Abnormal   Collection Time: 11/26/16    8:50 AM  Result Value Ref Range   Sodium 138 135 - 145 mmol/L   Potassium 4.5 3.5 - 5.1 mmol/L   Chloride 102 101 - 111 mmol/L   CO2 26 22 - 32 mmol/L   Glucose, Bld 101 (H) 65 - 99 mg/dL   BUN 25 (H) 6 - 20 mg/dL   Creatinine, Ser 2.23 (H) 0.61 - 1.24 mg/dL   Calcium 9.3 8.9 - 10.3 mg/dL   Total Protein 7.7 6.5 - 8.1 g/dL   Albumin 4.4 3.5 - 5.0 g/dL   AST 23 15 - 41 U/L   ALT 17 17 - 63 U/L   Alkaline Phosphatase 52 38 - 126 U/L   Total Bilirubin 0.8 0.3 - 1.2  mg/dL   GFR calc non Af Amer 27 (L) >60 mL/min   GFR calc Af Amer 32 (L) >60 mL/min    Comment: (NOTE) The eGFR has been calculated using the CKD EPI equation. This calculation has not been validated in all clinical situations. eGFR's persistently <60 mL/min signify possible Chronic Kidney Disease.    Anion gap 10 5 - 15  Lipase, blood     Status: None   Collection Time: 11/26/16  8:50 AM  Result Value Ref Range   Lipase 24 11 - 51 U/L  CBC with Differential     Status: Abnormal   Collection Time: 11/26/16  8:50 AM  Result Value Ref Range   WBC 12.3 (H) 4.0 - 10.5 K/uL   RBC 4.77 4.22 - 5.81 MIL/uL   Hemoglobin 14.0 13.0 - 17.0 g/dL   HCT 41.8 39.0 - 52.0 %   MCV 87.6 78.0 - 100.0 fL   MCH 29.4 26.0 - 34.0 pg   MCHC 33.5 30.0 - 36.0 g/dL   RDW 14.2 11.5 - 15.5 %   Platelets 203 150 - 400 K/uL   Neutrophils Relative % 73 %   Neutro Abs 9.0 (H) 1.7 - 7.7 K/uL   Lymphocytes Relative 14 %   Lymphs Abs 1.7 0.7 - 4.0 K/uL   Monocytes Relative 12 %   Monocytes Absolute 1.5 (H) 0.1 - 1.0 K/uL   Eosinophils Relative 1 %   Eosinophils Absolute 0.1 0.0 - 0.7 K/uL   Basophils Relative 0 %   Basophils Absolute 0.0 0.0 - 0.1 K/uL  Urinalysis, Routine w reflex microscopic     Status: Abnormal   Collection Time: 11/26/16 12:21 PM  Result Value Ref Range   Color, Urine YELLOW YELLOW   APPearance CLEAR CLEAR   Specific Gravity, Urine 1.018 1.005 - 1.030   pH 5.0 5.0 - 8.0   Glucose, UA NEGATIVE NEGATIVE mg/dL   Hgb urine dipstick MODERATE (A) NEGATIVE   Bilirubin Urine NEGATIVE NEGATIVE   Ketones, ur 5 (A) NEGATIVE mg/dL   Protein, ur NEGATIVE NEGATIVE mg/dL   Nitrite NEGATIVE NEGATIVE   Leukocytes, UA NEGATIVE NEGATIVE   RBC / HPF 6-30 0 - 5 RBC/hpf   WBC, UA 0-5 0 - 5 WBC/hpf   Bacteria, UA NONE SEEN NONE SEEN   No results found for this or any previous visit (from the past 240 hour(s)). Creatinine:  Recent Labs  11/24/16 1738 11/26/16 0850  CREATININE 1.99* 2.23*     Xrays: See report/chart As above reviewed  Impression/Assessment:  Left renal stone with colic OR SCHEDULED FOR 2 PM TODAY KEEP NPO  Plan:  Picture drawn; cysto and retro and stent and eventual ESWL; pros and cons and risks and sequelae discussed including injury and   perc access After a thorough review of the management options for the patient's condition the patient  elected to proceed with surgical therapy as noted above. We have discussed the potential benefits and risks of the procedure, side effects of the proposed treatment, the likelihood of the patient achieving the goals of the procedure, and any potential problems that might occur during the procedure or recuperation. Informed consent has been obtained.  Shamus Desantis A 11/26/2016, 1:19 PM     

## 2016-11-26 NOTE — ED Notes (Signed)
Pt continues to c/o pain.  No relief with morphine.  Dr. Thurnell Garbe notified.  Ok to go ahead and give another dose of morphine.

## 2016-11-26 NOTE — Progress Notes (Signed)
Pt in severe pain abdominal distension, c/o lower abdominal pain and no BM for 8 days, also being assed for obstructing kidney stone , has urology appt in [place] at 10 am Sitting is uncomfortable, no food for 3 days, weak and unable to support himself unassisted. I accompanied hiom to his car and have notified the Ed Physician at [facility] , Dr Elise Benne of his condition. No vitals taken , just assessed in the office briefly

## 2016-11-26 NOTE — ED Provider Notes (Signed)
[facility] DEPT Provider Note   CSN: [id number] Arrival date & time: 11/26/16  0824     History   Chief Complaint Chief Complaint  Patient presents with  . Abdominal Pain    HPI [name] [name] is a 76 y.o. male.  HPI  Pt was seen at 0835. Per pt, c/o gradual onset and persistence of constant left sided flank "pain" that began 8 days ago.  Pt describes the pain radiating into the left side of his abd.  Has been associated with multiple intermittent episodes of "dry heaving" as well as "constipation" for the past 8 days. Pt was evaluated in the ED 2 days ago for these symptoms, dx left sided kidney stone.  Pt states he was evaluated by his PMD this morning, then sent to the ED for further evaluation "because he might have a bowel blockage." Denies testicular pain/swelling, no dysuria/hematuria, no diarrhea, no CP/SOB, no fevers, no rash.    Past Medical History:  Diagnosis Date  . Anemia, iron deficiency   . GERD (gastroesophageal reflux disease)   . H/O hiatal hernia   . Hemorrhoids    and minimal polp  . History of migraines childhood    Flared up in 2009, and have responded to depakote which was stopped in 9 months  . Hx of colonic polyps 02/24/2007  . Hyperlipidemia 2003  . Hypertension 2003  . Lung nodules since 2009   CT scan advised in 08/2010  . Neoplasm    dermal  . Overweight(278.02)     Patient Active Problem List   Diagnosis Date Noted  . IGT (impaired glucose tolerance) 10/01/2014  . Frequent headaches 09/25/2014  . GERD (gastroesophageal reflux disease) 11/14/2013  . Medicare annual wellness visit, subsequent 04/30/2013  . Colon polyp 04/12/2012  . Obesity (BMI 30.0-34.9) 12/15/2010  . Allergic rhinitis 12/15/2010  . LUNG NODULE 08/18/2010  . ELECTROCARDIOGRAM, ABNORMAL 08/18/2010  . Nonspecific abnormal findings on radiological and other examination of skull and head 08/18/2010  . Hyperlipemia 07/23/2010  . Essential hypertension 07/23/2010     Past Surgical History:  Procedure Laterality Date  . COLONOSCOPY  2008   Fields-mod IH, 57mm polyp sigmoid colon (disappeared w/ insufflation & could not be retrieved)  . COLONOSCOPY  05/19/2012   Procedure: COLONOSCOPY;  Surgeon: Danie Binder, MD;  Location: [facility];  Service: Endoscopy;  Laterality: N/A;  8:30  . ESOPHAGOGASTRODUODENOSCOPY  01/05/2007   Fields-sm HH, benign SB biopsy  . GIVENS CAPSULE STUDY  01/20/2007   Fields-nothing to explain IDA, lymphectasias  . removal of skin cancer twice         Home Medications    Prior to Admission medications   Medication Sig Start Date End Date Taking? Authorizing Provider  aspirin (ASPIRIN LOW DOSE) 81 MG EC tablet Take 81 mg by mouth every morning.     Historical Provider, MD  butalbital-acetaminophen-caffeine (ESGIC) 50-325-40 MG per tablet Take 1 tablet by mouth every 6 (six) hours as needed for headache. No more than twice weekly 10/18/14   Fayrene Helper, MD  Krill Oil 300 MG CAPS Take 1 capsule by mouth daily.    Historical Provider, MD  loratadine (CLARITIN) 10 MG tablet Take 10 mg by mouth daily.    Historical Provider, MD  metoprolol succinate (TOPROL-XL) 50 MG 24 hr tablet Take 1 tablet (50 mg total) by mouth daily. Take with or immediately following a meal. 09/16/16   Fayrene Helper, MD  Misc Natural Products [facility]  CONCENTRATE PO) Take by mouth.    Historical Provider, MD  Multiple Vitamin (MULTIVITAMIN WITH MINERALS) TABS Take 1 tablet by mouth daily.    Historical Provider, MD  omeprazole (PRILOSEC) 20 MG capsule TAKE ONE CAPSULE BY MOUTH ONCE DAILY IN THE MORNING 08/21/16   Fayrene Helper, MD  ondansetron (ZOFRAN) 4 MG tablet Take 1 tablet (4 mg total) by mouth every 6 (six) hours. 11/24/16   Lily Kocher, PA-C  oxyCODONE-acetaminophen (PERCOCET/ROXICET) 5-325 MG tablet Take 1 tablet by mouth every 6 (six) hours as needed. 11/24/16   Lily Kocher, PA-C  simvastatin (ZOCOR) 40 MG tablet TAKE ONE  TABLET BY MOUTH AT BEDTIME 09/12/16   Fayrene Helper, MD  tamsulosin (FLOMAX) 0.4 MG CAPS capsule Take 1 capsule (0.4 mg total) by mouth daily. Take at hs. 11/24/16   Lily Kocher, PA-C  terazosin (HYTRIN) 2 MG capsule TAKE ONE CAPSULE BY MOUTH AT BEDTIME 11/07/16   Fayrene Helper, MD    Family History Family History  Problem Relation Age of Onset  . Heart defect Mother     pacemaker   . Alzheimer's disease Mother 29    2011  . Heart defect Brother     bradycardia   . Heart failure Father   . Colon cancer Neg Hx     Social History Social History  Substance Use Topics  . Smoking status: Former Smoker    Packs/day: 2.00    Years: 30.00    Types: Cigarettes  . Smokeless tobacco: Former Systems developer    Types: Snuff, Chew  . Alcohol use No     Allergies   Ace inhibitors   Review of Systems Review of Systems ROS: Statement: All systems negative except as marked or noted in the HPI; Constitutional: Negative for fever and chills. ; ; Eyes: Negative for eye pain, redness and discharge. ; ; ENMT: Negative for ear pain, hoarseness, nasal congestion, sinus pressure and sore throat. ; ; Cardiovascular: Negative for chest pain, palpitations, diaphoresis, dyspnea and peripheral edema. ; ; Respiratory: Negative for cough, wheezing and stridor. ; ; Gastrointestinal: +dry heaving, constipation. Negative for diarrhea, blood in stool, hematemesis, jaundice and rectal bleeding. . ; ; Genitourinary: +flank pain. Negative for dysuria and hematuria. ; ; Genital:  No penile drainage or rash, no testicular pain or swelling, no scrotal rash or swelling. ;; Musculoskeletal: Negative for back pain and neck pain. Negative for swelling and trauma.; ; Skin: Negative for pruritus, rash, abrasions, blisters, bruising and skin lesion.; ; Neuro: Negative for headache, lightheadedness and neck stiffness. Negative for weakness, altered level of consciousness, altered mental status, extremity weakness, paresthesias,  involuntary movement, seizure and syncope.       Physical Exam Updated Vital Signs BP (!) 127/99   Pulse 69   Temp 98.9 F (37.2 C) (Oral)   Resp 20   SpO2 91%   Physical Exam  0840: Physical examination:  Nursing notes reviewed; Vital signs and O2 SAT reviewed;  Constitutional: Well developed, Well nourished, Well hydrated, Uncomfortable appearing;; Head:  Normocephalic, atraumatic; Eyes: EOMI, PERRL, No scleral icterus; ENMT: Mouth and pharynx normal, Mucous membranes moist; Neck: Supple, Full range of motion, No lymphadenopathy; Cardiovascular: Regular rate and rhythm, No gallop; Respiratory: Breath sounds clear & equal bilaterally, No wheezes.  Speaking full sentences with ease, Normal respiratory effort/excursion; Chest: Nontender, Movement normal; Abdomen: Soft, +diffuse tenderness to palp. No rebound or guarding. Nondistended, Normal bowel sounds; Genitourinary: No CVA tenderness; Spine:  No midline CS, TS, LS tenderness. +  TTP left lumbar paraspinal muscles.;; Extremities: Pulses normal, No tenderness, No edema, No calf edema or asymmetry.; Neuro: AA&Ox3, Major CN grossly intact.  Speech clear. No gross focal motor or sensory deficits in extremities. Climbs on and off stretcher easily by himself. Gait steady.; Skin: Color normal, Warm, Dry.   ED Treatments / Results  Labs (all labs ordered are listed, but only abnormal results are displayed)   EKG  EKG Interpretation None       Radiology   Procedures Procedures (including critical care time)  Medications Ordered in ED Medications  ondansetron (ZOFRAN) injection 4 mg (4 mg Intravenous Given 11/26/16 0845)  morphine 4 MG/ML injection 4 mg (4 mg Intravenous Given 11/26/16 0845)     Initial Impression / Assessment and Plan / ED Course  I have reviewed the triage vital signs and the nursing notes.  Pertinent labs & imaging results that were available during my care of the patient were reviewed by me and considered in my  medical decision making (see chart for details).  MDM Reviewed: previous chart, nursing note and vitals Reviewed previous: labs and CT scan Interpretation: ultrasound, x-ray and labs    Results for orders placed or performed during the hospital encounter of 11/26/16  Comprehensive metabolic panel  Result Value Ref Range   Sodium 138 135 - 145 mmol/L   Potassium 4.5 3.5 - 5.1 mmol/L   Chloride 102 101 - 111 mmol/L   CO2 26 22 - 32 mmol/L   Glucose, Bld 101 (H) 65 - 99 mg/dL   BUN 25 (H) 6 - 20 mg/dL   Creatinine, Ser 2.23 (H) 0.61 - 1.24 mg/dL   Calcium 9.3 8.9 - 10.3 mg/dL   Total Protein 7.7 6.5 - 8.1 g/dL   Albumin 4.4 3.5 - 5.0 g/dL   AST 23 15 - 41 U/L   ALT 17 17 - 63 U/L   Alkaline Phosphatase 52 38 - 126 U/L   Total Bilirubin 0.8 0.3 - 1.2 mg/dL   GFR calc non Af Amer 27 (L) >60 mL/min   GFR calc Af Amer 32 (L) >60 mL/min   Anion gap 10 5 - 15  Lipase, blood  Result Value Ref Range   Lipase 24 11 - 51 U/L  CBC with Differential  Result Value Ref Range   WBC 12.3 (H) 4.0 - 10.5 K/uL   RBC 4.77 4.22 - 5.81 MIL/uL   Hemoglobin 14.0 13.0 - 17.0 g/dL   HCT 41.8 39.0 - 52.0 %   MCV 87.6 78.0 - 100.0 fL   MCH 29.4 26.0 - 34.0 pg   MCHC 33.5 30.0 - 36.0 g/dL   RDW 14.2 11.5 - 15.5 %   Platelets 203 150 - 400 K/uL   Neutrophils Relative % 73 %   Neutro Abs 9.0 (H) 1.7 - 7.7 K/uL   Lymphocytes Relative 14 %   Lymphs Abs 1.7 0.7 - 4.0 K/uL   Monocytes Relative 12 %   Monocytes Absolute 1.5 (H) 0.1 - 1.0 K/uL   Eosinophils Relative 1 %   Eosinophils Absolute 0.1 0.0 - 0.7 K/uL   Basophils Relative 0 %   Basophils Absolute 0.0 0.0 - 0.1 K/uL  Urinalysis, Routine w reflex microscopic  Result Value Ref Range   Color, Urine YELLOW YELLOW   APPearance CLEAR CLEAR   Specific Gravity, Urine 1.018 1.005 - 1.030   pH 5.0 5.0 - 8.0   Glucose, UA NEGATIVE NEGATIVE mg/dL   Hgb urine dipstick MODERATE (A) NEGATIVE  Bilirubin Urine NEGATIVE NEGATIVE   Ketones, ur 5 (A)  NEGATIVE mg/dL   Protein, ur NEGATIVE NEGATIVE mg/dL   Nitrite NEGATIVE NEGATIVE   Leukocytes, UA NEGATIVE NEGATIVE   RBC / HPF 6-30 0 - 5 RBC/hpf   WBC, UA 0-5 0 - 5 WBC/hpf   Bacteria, UA NONE SEEN NONE SEEN   US Renal Result Date: 11/26/2016 CLINICAL DATA:  Flank pain EXAM: RENAL / URINARY TRACT ULTRASOUND COMPLETE COMPARISON:  11/24/2016 FINDINGS: Right Kidney: Length: 11.9 cm. 2.3 cm cyst is noted similar to that seen on the prior CT examination. The nonobstructing stones seen previously are not well appreciated on this exam. Mild cortical thinning is noted. Left Kidney: Length: 13.6 cm. Hydronephrosis is noted similar to that seen on the prior exam. The proximal ureteral stone is not well appreciated on this study. Bladder: Partially decompressed. Prominent prostate is noted similar to that seen on recent CT. IMPRESSION: Stable left hydronephrosis. Right renal cyst. Electronically Signed   By: Inez Catalina M.D.   On: 11/26/2016 09:21   Dg Abd Acute W/chest Result Date: 11/26/2016 CLINICAL DATA:  Abdominal pain and distention.  Constipation. EXAM: DG ABDOMEN ACUTE W/ 1V CHEST COMPARISON:  Chest CT July 22, 2012; CT abdomen and pelvis November 24, 2016 FINDINGS: PA chest: There is atelectatic change in the right base and left upper lobe regions. Lungs elsewhere are clear. Heart size and pulmonary vascularity are normal. No adenopathy. Note that there is an apparent nipple shadow on the right. Supine and upright abdomen: 7 mm calculus is noted at the level of L3-4 on the left. There is moderate stool in the colon. There is no bowel dilatation or air-fluid level suggesting bowel obstruction. No free air. IMPRESSION: Calculus proximal left ureter at the level of L3-4 on the left. No bowel obstruction or free air. Moderate stool in colon. Patchy atelectasis left upper lobe and right base regions. No airspace consolidation. Electronically Signed   By: Lowella Grip III M.D.   On: 11/26/2016 09:30    Ct Renal Stone Study Result Date: 11/26/2016 CLINICAL DATA:  Left flank pain for 1 week with constipation. Recent obstructive ureteral stone. EXAM: CT ABDOMEN AND PELVIS WITHOUT CONTRAST TECHNIQUE: Multidetector CT imaging of the abdomen and pelvis was performed following the standard protocol without IV contrast. COMPARISON:  11/26/2016 radiographs FINDINGS: Lower chest: 6 by 4 mm right lower lobe nodule, not appreciably changed from 08/30/2010. Mild atelectasis in both lower lobes and in the right middle lobe. Left anterior descending coronary artery atherosclerotic calcification. Hepatobiliary: Unremarkable Pancreas: Unremarkable Spleen: Unremarkable Adrenals/Urinary Tract: Adrenal glands normal. Nonobstructive right nephrolithiasis with about 6 nonobstructive calculi observed, the largest 3 mm in diameter. Right peripelvic cysts. Normal-appearing right ureter. 2.4 by 2.0 cm right kidney lower pole partially exophytic hypodense lesion posteriorly, 8 Hounsfield units. There is also a mildly hyperdense 6 mm right kidney lower pole lesion on image 43/2 which is stable and possibly a complex cyst. Left hydronephrosis due to a 7 mm in long axis left UPJ calculus, image 45/2. There is periureteral stranding extending along the proximal half of the ureter but no distal stone seen. In addition there are approximately 5 additional nonobstructive left renal calculi and mildly asymmetric left perirenal stranding. Stomach/Bowel: Unremarkable Vascular/Lymphatic: Mild aortoiliac atherosclerotic vascular calcification. Small retroperitoneal lymph nodes are not pathologically enlarged by size criteria. Reproductive: Prostate gland measures 4.9 by 4.3 by 6.0 cm (volume = 66 cm^3), with median lobe indenting the bladder base. Other: No supplemental non-categorized  findings. Musculoskeletal: 7 mm of degenerative grade 1 anterolisthesis at L5-S1 with facet arthropathy causing moderate left and mild right foraminal stenosis at  this level. Lumbar spondylosis and degenerative disc disease also cause central narrowing of the thecal sac at L3-4 and possibly L1-2 and L2-3 ; and foraminal impingement at the L4-5 level. Lower thoracic spondylosis is also present. A small umbilical hernia contains adipose tissue. IMPRESSION: 1. Similar appearance to prior CT, with a nonobstructive left UPJ calculus causing left hydronephrosis and asymmetric left perirenal stranding. This stone measures 7 mm in long axis. There is periureteral stranding in the proximal half of the left ureter but no distal stone is seen. 2. Multiple tiny punctate bilateral nonobstructive renal calculi are also present. 3. Enlarged prostate gland, 66 cubic cm. 4. Lumbar spondylosis and degenerative disc disease with 7 mm of degenerative anterolisthesis at that L5 on S1, and multilevel suspected impingement as noted above. 5. Chronically stable 5 mm right lower lobe pulmonary nodule, benign. 6. Coronary and aortoiliac atherosclerotic calcification. Electronically Signed   By: Van Clines M.D.   On: 11/26/2016 12:00    Results for JAMAAR, BOTTORFF (MRN [id number]) as of 11/26/2016 13:25  Ref. Range 09/18/2015 08:08 03/06/2016 07:36 08/26/2016 08:31 11/24/2016 17:38 11/26/2016 08:50  BUN Latest Ref Range: 6 - 20 mg/dL 14 16 13 20 25  (H)  Creatinine Latest Ref Range: 0.61 - 1.24 mg/dL 1.36 (H) 1.39 (H) 1.27 (H) 1.99 (H) 2.23 (H)    1250:  BUN/Cr elevated from baseline. No UTI on Udip. Pt more comfortable after multiple doses of IV morphine and fentanyl.  T/C to U[facility] Dr. Lorra Hals, case discussed, including:  HPI, pertinent PM/SHx, VS/PE, dx testing, ED course and treatment:  Agreeable to come to ED for evaluation. Dx and testing, as well as d/w Uro MD, d/w pt and family.  Questions answered.  Verb understanding, agreeable with plan.  1350:  T/C from [facility] Dr. Lorra Hals, case discussed, including:  HPI, pertinent PM/SHx, VS/PE, dx testing, ED course and treatment:  Pt may need  stent, requests to keep pt NPO, transfer to [facility] under [facility] service. T/C to [facility] Dr. Roderic Palau, case discussed, including:  HPI, pertinent PM/SHx, VS/PE, dx testing, ED course and treatment:  Agreeable to facilitate transfer/admit to [facility].   Final Clinical Impressions(s) / ED Diagnoses   Final diagnoses:  None    New Prescriptions New Prescriptions   No medications on file     Francine Graven, DO 11/29/16 2133

## 2016-11-26 NOTE — H&P (Signed)
History and Physical    [name] [name] [id number] DOB: Sep 09, 1941 DOA: 11/26/2016  PCP: Tula Nakayama, MD  Patient coming from: home  Chief Complaint: abdominal pain  HPI: [name] [name] is a 76 y.o. male with medical history significant of hypertension, hyperlipidemia, GERD presents to the hospital with complaints of left flank/left abdominal pain. He reports his symptoms present intermittently for just over a week now. Pain is described as recurrent left flank and radiated down to his left groin. By mouth intake has been poor. He's not had any vomiting, but has had dry heaving and decreased appetite. He reports having difficulty passing his bowels for the past week. He did pass gas yesterday. Feels that his abdomen is distended. No fever. No cough or shortness of breath. No chest pain. He was evaluated in the emergency room on 11/24/16 and was found to have a left-sided kidney stone. He was treated with pain medications and reported improvement. Plans were to follow up with urology. Since his discharge, he is having worsening pain and discomfort. He came back to the emergency room today due to relentless pain. Repeat imaging indicates persistent left-sided kidney stone and associated hydronephrosis. Case was discussed by EDP with urology. It was requested the patient be admitted to [facility] to be further evaluated by urology and possible stent placement.  Review of Systems: As per HPI otherwise 10 point review of systems negative.   Past Medical History:  Diagnosis Date  . Anemia, iron deficiency   . GERD (gastroesophageal reflux disease)   . H/O hiatal hernia   . Hemorrhoids    and minimal polp  . History of migraines childhood    Flared up in 2009, and have responded to depakote which was stopped in 9 months  . Hx of colonic polyps 02/24/2007  . Hyperlipidemia 2003  . Hypertension 2003  . Lung nodules since 2009   CT scan advised in 08/2010  . Neoplasm    dermal  .  Overweight(278.02)     Past Surgical History:  Procedure Laterality Date  . COLONOSCOPY  2008   Fields-mod IH, 49mm polyp sigmoid colon (disappeared w/ insufflation & could not be retrieved)  . COLONOSCOPY  05/19/2012   Procedure: COLONOSCOPY;  Surgeon: Danie Binder, MD;  Location: [facility];  Service: Endoscopy;  Laterality: N/A;  8:30  . ESOPHAGOGASTRODUODENOSCOPY  01/05/2007   Fields-sm HH, benign SB biopsy  . GIVENS CAPSULE STUDY  01/20/2007   Fields-nothing to explain IDA, lymphectasias  . removal of skin cancer twice       reports that he has quit smoking. His smoking use included Cigarettes. He has a 60.00 pack-year smoking history. He has quit using smokeless tobacco. His smokeless tobacco use included Snuff and Chew. He reports that he does not drink alcohol or use drugs.  Allergies  Allergen Reactions  . Ace Inhibitors Cough    Family member not sure if he is allergic or not.    Family History  Problem Relation Age of Onset  . Heart defect Mother     pacemaker   . Alzheimer's disease Mother 75    2011  . Heart defect Brother     bradycardia   . Heart failure Father   . Colon cancer Neg Hx      Prior to Admission medications   Medication Sig Start Date End Date Taking? Authorizing Provider  acetaminophen (TYLENOL) 325 MG tablet Take 325 mg by mouth as needed for mild pain.  Yes Historical Provider, MD  aspirin (ASPIRIN LOW DOSE) 81 MG EC tablet Take 81 mg by mouth every morning.    Yes Historical Provider, MD  butalbital-acetaminophen-caffeine (ESGIC) 50-325-40 MG per tablet Take 1 tablet by mouth every 6 (six) hours as needed for headache. No more than twice weekly 10/18/14  Yes Fayrene Helper, MD  Krill Oil 300 MG CAPS Take 1 capsule by mouth daily.   Yes Historical Provider, MD  loratadine (CLARITIN) 10 MG tablet Take 10 mg by mouth daily.   Yes Historical Provider, MD  metoprolol succinate (TOPROL-XL) 50 MG 24 hr tablet Take 1 tablet (50 mg total) by  mouth daily. Take with or immediately following a meal. 09/16/16  Yes Fayrene Helper, MD  Misc Natural Products (BLACK CHERRY CONCENTRATE PO) Take by mouth.   Yes Historical Provider, MD  Multiple Vitamin (MULTIVITAMIN WITH MINERALS) TABS Take 1 tablet by mouth daily.   Yes Historical Provider, MD  omeprazole (PRILOSEC) 20 MG capsule TAKE ONE CAPSULE BY MOUTH ONCE DAILY IN THE MORNING 08/21/16  Yes Fayrene Helper, MD  ondansetron (ZOFRAN) 4 MG tablet Take 1 tablet (4 mg total) by mouth every 6 (six) hours. 11/24/16  Yes Lily Kocher, PA-C  oxyCODONE-acetaminophen (PERCOCET/ROXICET) 5-325 MG tablet Take 1 tablet by mouth every 6 (six) hours as needed. 11/24/16  Yes Lily Kocher, PA-C  simvastatin (ZOCOR) 40 MG tablet TAKE ONE TABLET BY MOUTH AT BEDTIME 09/12/16  Yes Fayrene Helper, MD  tamsulosin (FLOMAX) 0.4 MG CAPS capsule Take 1 capsule (0.4 mg total) by mouth daily. Take at hs. 11/24/16  Yes Lily Kocher, PA-C  terazosin (HYTRIN) 2 MG capsule TAKE ONE CAPSULE BY MOUTH AT BEDTIME 11/07/16  Yes Fayrene Helper, MD    Physical Exam: Vitals:   11/26/16 1330 11/26/16 1400 11/26/16 1430 11/26/16 1500  BP: 135/86 133/79 142/92 136/82  Pulse: 74 70 77 76  SpO2: 100% 99% 96% 97%      Constitutional: NAD, calm, comfortable Vitals:   11/26/16 1330 11/26/16 1400 11/26/16 1430 11/26/16 1500  BP: 135/86 133/79 142/92 136/82  Pulse: 74 70 77 76  SpO2: 100% 99% 96% 97%   Eyes: PERRL, lids and conjunctivae normal ENMT: Mucous membranes are moist. Posterior pharynx clear of any exudate or lesions.Normal dentition.  Neck: normal, supple, no masses, no thyromegaly Respiratory: clear to auscultation bilaterally, no wheezing, no crackles. Normal respiratory effort. No accessory muscle use.  Cardiovascular: Regular rate and rhythm, no murmurs / rubs / gallops. No extremity edema. 2+ pedal pulses. No carotid bruits.  Abdomen:tenderness in left upper and lower quadrants, no masses palpated. No  hepatosplenomegaly. Bowel sounds positive.  Musculoskeletal: no clubbing / cyanosis. No joint deformity upper and lower extremities. Good ROM, no contractures. Normal muscle tone.  Skin: no rashes, lesions, ulcers. No induration Neurologic: CN 2-12 grossly intact. Sensation intact, DTR normal. Strength 5/5 in all 4.  Psychiatric: Normal judgment and insight. Alert and oriented x 3. Normal mood.   Labs on Admission: I have personally reviewed following labs and imaging studies  CBC:  Recent Labs Lab 11/24/16 1738 11/26/16 0850  WBC 9.8 12.3*  NEUTROABS  --  9.0*  HGB 13.3 14.0  HCT 39.3 41.8  MCV 86.8 87.6  PLT 177 [id number]   Basic Metabolic Panel:  Recent Labs Lab 11/24/16 1738 11/26/16 0850  NA 137 138  K 4.3 4.5  CL 103 102  CO2 26 26  GLUCOSE 101* 101*  BUN 20 25*  CREATININE 1.99*  2.23*  CALCIUM 8.8* 9.3   GFR: Estimated Creatinine Clearance: 33.7 mL/min (by C-G formula based on SCr of 2.23 mg/dL (H)). Liver Function Tests:  Recent Labs Lab 11/26/16 0850  AST 23  ALT 17  ALKPHOS 52  BILITOT 0.8  PROT 7.7  ALBUMIN 4.4    Recent Labs Lab 11/26/16 0850  LIPASE 24   No results for input(s): AMMONIA in the last 168 hours. Coagulation Profile: No results for input(s): INR, PROTIME in the last 168 hours. Cardiac Enzymes: No results for input(s): CKTOTAL, CKMB, CKMBINDEX, TROPONINI in the last 168 hours. BNP (last 3 results) No results for input(s): PROBNP in the last 8760 hours. HbA1C: No results for input(s): HGBA1C in the last 72 hours. CBG: No results for input(s): GLUCAP in the last 168 hours. Lipid Profile: No results for input(s): CHOL, HDL, LDLCALC, TRIG, CHOLHDL, LDLDIRECT in the last 72 hours. Thyroid Function Tests: No results for input(s): TSH, T4TOTAL, FREET4, T3FREE, THYROIDAB in the last 72 hours. Anemia Panel: No results for input(s): VITAMINB12, FOLATE, FERRITIN, TIBC, IRON, RETICCTPCT in the last 72 hours. Urine analysis:      Component Value Date/Time   COLORURINE YELLOW 11/26/2016 1221   APPEARANCEUR CLEAR 11/26/2016 1221   LABSPEC 1.018 11/26/2016 1221   PHURINE 5.0 11/26/2016 1221   GLUCOSEU NEGATIVE 11/26/2016 1221   GLUCOSEU NEG mg/dL 08/14/2010 0122   HGBUR MODERATE (A) 11/26/2016 1221   HGBUR trace-lysed 08/12/2010 1314   BILIRUBINUR NEGATIVE 11/26/2016 1221   KETONESUR 5 (A) 11/26/2016 1221   PROTEINUR NEGATIVE 11/26/2016 1221   UROBILINOGEN 0.2 08/14/2010 0122   NITRITE NEGATIVE 11/26/2016 1221   LEUKOCYTESUR NEGATIVE 11/26/2016 1221   Sepsis Labs: !!!!!!!!!!!!!!!!!!!!!!!!!!!!!!!!!!!!!!!!!!!! @LABRCNTIP (procalcitonin:4,lacticidven:4) )No results found for this or any previous visit (from the past 240 hour(s)).   Radiological Exams on Admission: US Renal  Result Date: 11/26/2016 CLINICAL DATA:  Flank pain EXAM: RENAL / URINARY TRACT ULTRASOUND COMPLETE COMPARISON:  11/24/2016 FINDINGS: Right Kidney: Length: 11.9 cm. 2.3 cm cyst is noted similar to that seen on the prior CT examination. The nonobstructing stones seen previously are not well appreciated on this exam. Mild cortical thinning is noted. Left Kidney: Length: 13.6 cm. Hydronephrosis is noted similar to that seen on the prior exam. The proximal ureteral stone is not well appreciated on this study. Bladder: Partially decompressed. Prominent prostate is noted similar to that seen on recent CT. IMPRESSION: Stable left hydronephrosis. Right renal cyst. Electronically Signed   By: Inez Catalina M.D.   On: 11/26/2016 09:21   Dg Abd Acute W/chest  Result Date: 11/26/2016 CLINICAL DATA:  Abdominal pain and distention.  Constipation. EXAM: DG ABDOMEN ACUTE W/ 1V CHEST COMPARISON:  Chest CT July 22, 2012; CT abdomen and pelvis November 24, 2016 FINDINGS: PA chest: There is atelectatic change in the right base and left upper lobe regions. Lungs elsewhere are clear. Heart size and pulmonary vascularity are normal. No adenopathy. Note that there is an  apparent nipple shadow on the right. Supine and upright abdomen: 7 mm calculus is noted at the level of L3-4 on the left. There is moderate stool in the colon. There is no bowel dilatation or air-fluid level suggesting bowel obstruction. No free air. IMPRESSION: Calculus proximal left ureter at the level of L3-4 on the left. No bowel obstruction or free air. Moderate stool in colon. Patchy atelectasis left upper lobe and right base regions. No airspace consolidation. Electronically Signed   By: Lowella Grip III M.D.   On: 11/26/2016 09:30  Ct Renal Stone Study  Result Date: 11/26/2016 CLINICAL DATA:  Left flank pain for 1 week with constipation. Recent obstructive ureteral stone. EXAM: CT ABDOMEN AND PELVIS WITHOUT CONTRAST TECHNIQUE: Multidetector CT imaging of the abdomen and pelvis was performed following the standard protocol without IV contrast. COMPARISON:  11/26/2016 radiographs FINDINGS: Lower chest: 6 by 4 mm right lower lobe nodule, not appreciably changed from 08/30/2010. Mild atelectasis in both lower lobes and in the right middle lobe. Left anterior descending coronary artery atherosclerotic calcification. Hepatobiliary: Unremarkable Pancreas: Unremarkable Spleen: Unremarkable Adrenals/Urinary Tract: Adrenal glands normal. Nonobstructive right nephrolithiasis with about 6 nonobstructive calculi observed, the largest 3 mm in diameter. Right peripelvic cysts. Normal-appearing right ureter. 2.4 by 2.0 cm right kidney lower pole partially exophytic hypodense lesion posteriorly, 8 Hounsfield units. There is also a mildly hyperdense 6 mm right kidney lower pole lesion on image 43/2 which is stable and possibly a complex cyst. Left hydronephrosis due to a 7 mm in long axis left UPJ calculus, image 45/2. There is periureteral stranding extending along the proximal half of the ureter but no distal stone seen. In addition there are approximately 5 additional nonobstructive left renal calculi and mildly  asymmetric left perirenal stranding. Stomach/Bowel: Unremarkable Vascular/Lymphatic: Mild aortoiliac atherosclerotic vascular calcification. Small retroperitoneal lymph nodes are not pathologically enlarged by size criteria. Reproductive: Prostate gland measures 4.9 by 4.3 by 6.0 cm (volume = 66 cm^3), with median lobe indenting the bladder base. Other: No supplemental non-categorized findings. Musculoskeletal: 7 mm of degenerative grade 1 anterolisthesis at L5-S1 with facet arthropathy causing moderate left and mild right foraminal stenosis at this level. Lumbar spondylosis and degenerative disc disease also cause central narrowing of the thecal sac at L3-4 and possibly L1-2 and L2-3 ; and foraminal impingement at the L4-5 level. Lower thoracic spondylosis is also present. A small umbilical hernia contains adipose tissue. IMPRESSION: 1. Similar appearance to prior CT, with a nonobstructive left UPJ calculus causing left hydronephrosis and asymmetric left perirenal stranding. This stone measures 7 mm in long axis. There is periureteral stranding in the proximal half of the left ureter but no distal stone is seen. 2. Multiple tiny punctate bilateral nonobstructive renal calculi are also present. 3. Enlarged prostate gland, 66 cubic cm. 4. Lumbar spondylosis and degenerative disc disease with 7 mm of degenerative anterolisthesis at that L5 on S1, and multilevel suspected impingement as noted above. 5. Chronically stable 5 mm right lower lobe pulmonary nodule, benign. 6. Coronary and aortoiliac atherosclerotic calcification. Electronically Signed   By: Van Clines M.D.   On: 11/26/2016 12:00   Ct Renal Stone Study  Result Date: 11/24/2016 CLINICAL DATA:  Left flank pain intermittent since last Tuesday. EXAM: CT ABDOMEN AND PELVIS WITHOUT CONTRAST TECHNIQUE: Multidetector CT imaging of the abdomen and pelvis was performed following the standard protocol without IV contrast. COMPARISON:  06/23/2008 CT, lumbar  spine radiographs from 12/10/2015. FINDINGS: Lower chest: Atelectasis at each lung base. Top normal size cardiac chambers. Aortic atherosclerosis. Hepatobiliary: No focal liver abnormality is seen. No gallstones, gallbladder wall thickening, or biliary dilatation. Pancreas: No ductal dilatation or focal mass given limitations of a noncontrast study. Spleen: Normal Adrenals/Urinary Tract: Normal bilateral adrenal glands. Punctate 1-2 mm bilateral renal calculi. Parapelvic renal cysts. Left UPJ calculus measuring 7 x 6 x 5 mm in craniocaudad by transverse by AP dimension. This accounts for moderate left-sided proximal hydroureteronephrosis. Stable right lower pole 2.5 cm simple cyst. The bladder is nondistended. Stomach/Bowel: Stomach is within normal limits. Appendix appears  normal. No evidence of bowel wall thickening, distention, or inflammatory changes. Vascular/Lymphatic: No aortic aneurysm. Minimal aortic atherosclerosis. No lymphadenopathy. Reproductive: No prostatic enlargement. Other: No abdominal wall hernia or abnormality. No abdominopelvic ascites. Musculoskeletal: L4-5 and L5-S1 facet arthropathy. No acute osseous abnormality. Grade 1 anterolisthesis of L5 on S1. IMPRESSION: 1. 7 x 6 x 5 mm left UPJ stone causing moderate left-sided hydroureteronephrosis. 2. Bilateral punctate nonobstructing renal calculi, bilateral parapelvic renal cysts and stable right lower pole cortical 2.5 cm cyst. 3. Lower lumbar degenerative facet arthropathy with grade 1 anterolisthesis of L5 on S1. No acute osseous abnormality. Electronically Signed   By: Ashley Royalty M.D.   On: 11/24/2016 18:40    Assessment/Plan Active Problems:   Essential hypertension   GERD (gastroesophageal reflux disease)   Renal colic   AKI (acute kidney injury) (HCC)   Hydronephrosis, left   BPH (benign prostatic hyperplasia)   Hyperlipidemia   Constipation    1. Renal colic with left-sided UPJ stone and associated left hydronephrosis.  Patient will be seen by urology at [facility]. May need ureteral stent placed. Treat supportively with pain medications. Since he does have evidence of stranding on imaging, we'll start the patient on ceftriaxone. Follow-up urine culture.  2. Acute kidney injury. Likely related to decreased by mouth intake and dehydration. Start the patient on IV fluids. Repeat labs in a.m .  3. Constipation. Patient reports taking mineral oil enema at home without significant benefit. Will give trial of milk of molasses enema.  4. Hypertension. Continue on home dose of beta blockers.  5. BPH. Continue on Flomax.  6. GERD. Continue on PPI   DVT prophylaxis: scds for now, start heparin/lovenox after urology procedure if indicated Code Status: full code Family Communication: discussed with wife at the bedside Disposition Plan: admit to [facility] for further urologic management Consults called: urology, ED spoke to Dr. McDiarmid Admission status: observation/medsurg   Torunn Chancellor MD [facility] Pager [phone]  If 7PM-7AM, please contact night-coverage [email or web address] Password [facility]  11/26/2016, 3:55 PM

## 2016-11-26 NOTE — ED Notes (Signed)
Pt up ambulatory with daughter .

## 2016-11-27 ENCOUNTER — Encounter (HOSPITAL_COMMUNITY): Payer: Self-pay | Admitting: *Deleted

## 2016-11-27 ENCOUNTER — Inpatient Hospital Stay (HOSPITAL_COMMUNITY): Admission: RE | Admit: 2016-11-27 | Payer: PPO | Source: Ambulatory Visit | Admitting: Urology

## 2016-11-27 ENCOUNTER — Encounter (HOSPITAL_COMMUNITY)
Admission: EM | Disposition: A | Discharge status: Home / Self Care | Payer: Self-pay | Source: Home / Self Care | Attending: Family Medicine

## 2016-11-27 ENCOUNTER — Observation Stay (HOSPITAL_COMMUNITY): Payer: PPO | Admitting: Registered Nurse

## 2016-11-27 DIAGNOSIS — N201 Calculus of ureter: Secondary | ICD-10-CM | POA: Diagnosis not present

## 2016-11-27 DIAGNOSIS — I10 Essential (primary) hypertension: Secondary | ICD-10-CM | POA: Diagnosis not present

## 2016-11-27 DIAGNOSIS — N23 Unspecified renal colic: Secondary | ICD-10-CM | POA: Diagnosis not present

## 2016-11-27 DIAGNOSIS — N179 Acute kidney failure, unspecified: Secondary | ICD-10-CM | POA: Diagnosis not present

## 2016-11-27 DIAGNOSIS — N1330 Unspecified hydronephrosis: Secondary | ICD-10-CM

## 2016-11-27 DIAGNOSIS — E785 Hyperlipidemia, unspecified: Secondary | ICD-10-CM | POA: Diagnosis not present

## 2016-11-27 DIAGNOSIS — Z466 Encounter for fitting and adjustment of urinary device: Secondary | ICD-10-CM | POA: Diagnosis not present

## 2016-11-27 DIAGNOSIS — N132 Hydronephrosis with renal and ureteral calculous obstruction: Secondary | ICD-10-CM | POA: Diagnosis not present

## 2016-11-27 HISTORY — PX: CYSTOSCOPY W/ URETERAL STENT PLACEMENT: SHX1429

## 2016-11-27 LAB — CBC
HCT: 38 % — ABNORMAL LOW (ref 39.0–52.0)
Hemoglobin: 12.5 g/dL — ABNORMAL LOW (ref 13.0–17.0)
MCH: 28.5 pg (ref 26.0–34.0)
MCHC: 32.9 g/dL (ref 30.0–36.0)
MCV: 86.8 fL (ref 78.0–100.0)
Platelets: 189 10*3/uL (ref 150–400)
RBC: 4.38 MIL/uL (ref 4.22–5.81)
RDW: 14.3 % (ref 11.5–15.5)
WBC: 8.7 10*3/uL (ref 4.0–10.5)

## 2016-11-27 LAB — URINE CULTURE: Culture: NO GROWTH

## 2016-11-27 LAB — BASIC METABOLIC PANEL
Anion gap: 11 (ref 5–15)
BUN: 29 mg/dL — ABNORMAL HIGH (ref 6–20)
CO2: 22 mmol/L (ref 22–32)
Calcium: 8.5 mg/dL — ABNORMAL LOW (ref 8.9–10.3)
Chloride: 106 mmol/L (ref 101–111)
Creatinine, Ser: 2.26 mg/dL — ABNORMAL HIGH (ref 0.61–1.24)
GFR calc Af Amer: 31 mL/min — ABNORMAL LOW (ref >60)
GFR calc non Af Amer: 27 mL/min — ABNORMAL LOW (ref >60)
Glucose, Bld: 109 mg/dL — ABNORMAL HIGH (ref 65–99)
Potassium: 4.4 mmol/L (ref 3.5–5.1)
Sodium: 139 mmol/L (ref 135–145)

## 2016-11-27 LAB — SURGICAL PCR SCREEN
MRSA, PCR: NEGATIVE
Staphylococcus aureus: NEGATIVE

## 2016-11-27 SURGERY — CYSTOSCOPY, WITH RETROGRADE PYELOGRAM AND URETERAL STENT INSERTION
Anesthesia: General | Site: Ureter | Laterality: Left

## 2016-11-27 MED ORDER — SENNOSIDES-DOCUSATE SODIUM 8.6-50 MG PO TABS
1.0000 | ORAL_TABLET | Freq: Two times a day (BID) | ORAL | Status: DC
  Administered 2016-11-27 – 2016-11-28 (×2): 1 via ORAL
  Filled 2016-11-27 (×2): qty 1

## 2016-11-27 MED ORDER — ONDANSETRON HCL 4 MG/2ML IJ SOLN
INTRAMUSCULAR | Status: DC | PRN
  Administered 2016-11-27: 4 mg via INTRAVENOUS

## 2016-11-27 MED ORDER — FENTANYL CITRATE (PF) 100 MCG/2ML IJ SOLN
INTRAMUSCULAR | Status: DC | PRN
  Administered 2016-11-27: 50 ug via INTRAVENOUS

## 2016-11-27 MED ORDER — IOHEXOL 300 MG/ML  SOLN
INTRAMUSCULAR | Status: DC | PRN
  Administered 2016-11-27: 7 mL

## 2016-11-27 MED ORDER — CIPROFLOXACIN IN D5W 400 MG/200ML IV SOLN
INTRAVENOUS | Status: DC | PRN
  Administered 2016-11-27: 400 mg via INTRAVENOUS

## 2016-11-27 MED ORDER — HYDROMORPHONE HCL 1 MG/ML IJ SOLN
1.0000 mg | Freq: Once | INTRAMUSCULAR | Status: AC
  Administered 2016-11-27: 1 mg via INTRAVENOUS
  Filled 2016-11-27: qty 1

## 2016-11-27 MED ORDER — SODIUM CHLORIDE 0.9 % IR SOLN
Status: DC | PRN
  Administered 2016-11-27: 3000 mL

## 2016-11-27 MED ORDER — PROPOFOL 10 MG/ML IV BOLUS
INTRAVENOUS | Status: DC | PRN
Start: 2016-11-27 — End: 2016-11-27
  Administered 2016-11-27: 150 mg via INTRAVENOUS

## 2016-11-27 MED ORDER — 0.9 % SODIUM CHLORIDE (POUR BTL) OPTIME
TOPICAL | Status: DC | PRN
  Administered 2016-11-27: 1000 mL

## 2016-11-27 MED ORDER — LACTATED RINGERS IV SOLN
INTRAVENOUS | Status: DC | PRN
  Administered 2016-11-27: 15:00:00 via INTRAVENOUS

## 2016-11-27 MED ORDER — LIDOCAINE HCL (CARDIAC) 20 MG/ML IV SOLN
INTRAVENOUS | Status: DC | PRN
  Administered 2016-11-27: 100 mg via INTRAVENOUS

## 2016-11-27 MED ORDER — FENTANYL CITRATE (PF) 100 MCG/2ML IJ SOLN
50.0000 ug | INTRAMUSCULAR | Status: DC | PRN
  Administered 2016-11-27 – 2016-11-28 (×2): 100 ug via INTRAVENOUS
  Filled 2016-11-27 (×2): qty 2

## 2016-11-27 MED FILL — Hydromorphone HCl Inj 2 MG/ML: INTRAMUSCULAR | Qty: 1 | Status: AC

## 2016-11-27 MED FILL — Ondansetron HCl Inj 4 MG/2ML (2 MG/ML): INTRAMUSCULAR | Qty: 2 | Status: AC

## 2016-11-27 SURGICAL SUPPLY — 11 items
BAG URO CATCHER STRL LF (MISCELLANEOUS) ×3 IMPLANT
CATH INTERMIT  6FR 70CM (CATHETERS) ×3 IMPLANT
CLOTH BEACON ORANGE TIMEOUT ST (SAFETY) ×3 IMPLANT
GLOVE BIOGEL M STRL SZ7.5 (GLOVE) ×3 IMPLANT
GOWN STRL REUS W/TWL LRG LVL3 (GOWN DISPOSABLE) ×6 IMPLANT
GUIDEWIRE STR DUAL SENSOR (WIRE) ×3 IMPLANT
MANIFOLD NEPTUNE II (INSTRUMENTS) ×3 IMPLANT
PACK CYSTO (CUSTOM PROCEDURE TRAY) ×3 IMPLANT
STENT URET 6FRX26 CONTOUR (STENTS) ×2 IMPLANT
TUBING CONNECTING 10 (TUBING) ×1 IMPLANT
TUBING CONNECTING 10' (TUBING) ×1

## 2016-11-27 NOTE — Interval H&P Note (Signed)
History and Physical Interval Note:  11/27/2016 3:15 PM  [name] [name]  has presented today for surgery, with the diagnosis of left ureteral stone  The various methods of treatment have been discussed with the patient and family. After consideration of risks, benefits and other options for treatment, the patient has consented to  Procedure(s): CYSTOSCOPY WITH RETROGRADE PYELOGRAM/URETERAL STENT PLACEMENT (Left) as a surgical intervention .  The patient's history has been reviewed, patient examined, no change in status, stable for surgery.  I have reviewed the patient's chart and labs.  Questions were answered to the patient's satisfaction.     Ernesto Zukowski A

## 2016-11-27 NOTE — Op Note (Signed)
Operative diagnosis: Left ureteral stone Postoperative diagnosis: Left ureteral stone Surgery: Cystoscopy and left retrograde ureterogram and left stent Surgeon: Dr. Nicki Reaper Jahnai [place]  The patient consented to the above procedure. Preoperative antibiotics were given. A 24 [place] scope was utilized. Penile bulbar urethra normal. He had mild bilobar enlargement of the prostate. He had a significant elevated middle lobe. He had grade 1 and a 4 bladder trabeculation. I could see the ureteral orifices but there were not very large. There was no obvious cystitis. There is little bit of oozing from the middle lobe from the cystoscope  I took approximately 10 minutes the finally engage the left ureter because of his anatomy. I finally used the open-end ureteral catheter to engage the left ureteral orifice a few millimeters and then passed a wire to the mid ureter allowing me to advance the ureteral catheter  Retrograde ureterogram: As a separate procedure I I injected approxi-5 mL of contrast gently. I could see a filling defect at the left ureteropelvic junction secondary to the stone. He had mild hydronephrosis of his renal pelvis. He may have had a little bit of extravasation the lower pole calyx probably from calyceal rupture. This may or may not be present  Under fluoroscopic and cystoscopic guidance I passed a sensor wire curling of the lower pole calyx. It went past the stone easily. Over this I passed under the same guidance a 26 cm x 6 French stent curling in the lower pole calyx and in the bladder. X-ray was taken. Bladder was emptied. Patient was taken to recovery

## 2016-11-27 NOTE — Anesthesia Preprocedure Evaluation (Addendum)
Anesthesia Evaluation  Patient identified by MRN, date of birth, ID band Patient awake    Reviewed: Allergy & Precautions, NPO status , Patient's Chart, lab work & pertinent test results  History of Anesthesia Complications Negative for: history of anesthetic complications  Airway Mallampati: II  TM Distance: >3 FB Neck ROM: Full    Dental  (+) Teeth Intact   Pulmonary former smoker,    breath sounds clear to auscultation       Cardiovascular hypertension,  Rhythm:Regular     Neuro/Psych  Headaches,    GI/Hepatic hiatal hernia, GERD  ,  Endo/Other    Renal/GU Renal disease     Musculoskeletal   Abdominal   Peds  Hematology  (+) anemia ,   Anesthesia Other Findings   Reproductive/Obstetrics                                                             Anesthesia Evaluation    Airway        Dental   Pulmonary former smoker,           Cardiovascular hypertension,      Neuro/Psych    GI/Hepatic   Endo/Other    Renal/GU      Musculoskeletal   Abdominal   Peds  Hematology   Anesthesia Other Findings   Reproductive/Obstetrics                             Anesthesia Physical Anesthesia Plan Anesthesia Quick Evaluation  Anesthesia Physical Anesthesia Plan  ASA: II  Anesthesia Plan: General   Post-op Pain Management:    Induction: Intravenous  Airway Management Planned: LMA  Additional Equipment: None  Intra-op Plan:   Post-operative Plan: Extubation in OR  Informed Consent: I have reviewed the patients History and Physical, chart, labs and discussed the procedure including the risks, benefits and alternatives for the proposed anesthesia with the patient or authorized representative who has indicated his/her understanding and acceptance.   Dental advisory given  Plan Discussed with: CRNA and Surgeon  Anesthesia Plan  Comments:         Anesthesia Quick Evaluation

## 2016-11-27 NOTE — Addendum Note (Signed)
Addendum  created 11/27/16 1712 by Oleta Mouse, MD   Order list changed, Order sets accessed

## 2016-11-27 NOTE — Anesthesia Procedure Notes (Signed)
Procedure Name: LMA Insertion Date/Time: 11/27/2016 3:30 PM Performed by: Glory Buff Pre-anesthesia Checklist: Patient identified, Emergency Drugs available, Suction available and Patient being monitored Patient Re-evaluated:Patient Re-evaluated prior to inductionOxygen Delivery Method: Circle system utilized Preoxygenation: Pre-oxygenation with 100% oxygen Intubation Type: IV induction LMA: LMA with gastric port inserted LMA Size: 4.0 Number of attempts: 1 Placement Confirmation: positive ETCO2 Tube secured with: Tape Dental Injury: Teeth and Oropharynx as per pre-operative assessment

## 2016-11-27 NOTE — H&P (View-Only) (Signed)
Urology Consult  Referring physician: Towanda Malkin Reason for referral: Kidney stone  Chief Complaint: kidney stone  History of Present Illness: Elderly male seen Feb 5 and diagnosed with kidney stone; had pain and significant constipation; did not f/up yet with Dr Junious Silk; returned today with pain and nausea and vomiting and reimaged  CT scan:  5th: 7x5 mm stone at left UPJ; moderate hydro; bilateral non-obstr stones;  Today: same findings and stranding along upper ureter and kidney and no distal stone; large prostate noted  Previous stone history; no GU surgery; rare UTI; Nx3; flow reasonable  Pain severe thru the night treated   Modifying factors: There are no other modifying factors  Associated signs and symptoms: There are no other associated signs and symptoms Aggravating and relieving factors: There are no other aggravating or relieving factors Severity: Moderate Duration: Persistent  Cr 2.23 (baseline 1.27)  Past Medical History:  Diagnosis Date  . Anemia, iron deficiency   . GERD (gastroesophageal reflux disease)   . H/O hiatal hernia   . Hemorrhoids    and minimal polp  . History of migraines childhood    Flared up in 2009, and have responded to depakote which was stopped in 9 months  . Hx of colonic polyps 02/24/2007  . Hyperlipidemia 2003  . Hypertension 2003  . Lung nodules since 2009   CT scan advised in 08/2010  . Neoplasm    dermal  . Overweight(278.02)    Past Surgical History:  Procedure Laterality Date  . COLONOSCOPY  2008   Fields-mod IH, 18m polyp sigmoid colon (disappeared w/ insufflation & could not be retrieved)  . COLONOSCOPY  05/19/2012   Procedure: COLONOSCOPY;  Surgeon: SDanie Binder MD;  Location: [facility];  Service: Endoscopy;  Laterality: N/A;  8:30  . ESOPHAGOGASTRODUODENOSCOPY  01/05/2007   Fields-sm HH, benign SB biopsy  . GIVENS CAPSULE STUDY  01/20/2007   Fields-nothing to explain IDA, lymphectasias  . removal of skin cancer twice       Medications: I have reviewed the patient's current medications. Allergies:  Allergies  Allergen Reactions  . Ace Inhibitors Cough    Family member not sure if he is allergic or not.    Family History  Problem Relation Age of Onset  . Heart defect Mother     pacemaker   . Alzheimer's disease Mother 851    2011 . Heart defect Brother     bradycardia   . Heart failure Father   . Colon cancer Neg Hx    Social History:  reports that he has quit smoking. His smoking use included Cigarettes. He has a 60.00 pack-year smoking history. He has quit using smokeless tobacco. His smokeless tobacco use included Snuff and Chew. He reports that he does not drink alcohol or use drugs.  ROS: All systems are reviewed and negative except as noted. Rest OK  Physical Exam:  Vital signs in last 24 hours: Pulse Rate:  [69-81] 81 (02/07 1200) BP: (133-147)/(79-94) 133/79 (02/07 1200) SpO2:  [94 %-100 %] 99 % (02/07 1200)  Cardiovascular: Skin warm; not flushed Respiratory: Breaths quiet; no shortness of breath Abdomen: No masses Neurological: Normal sensation to touch Musculoskeletal: Normal motor function arms and legs Lymphatics: No inguinal adenopathy Skin: No rashes Genitourinary:non-toxic/ genitalia normal  Laboratory Data:  Results for orders placed or performed during the hospital encounter of 11/26/16 (from the past 72 hour(s))  Comprehensive metabolic panel     Status: Abnormal   Collection Time: 11/26/16  8:50 AM  Result Value Ref Range   Sodium 138 135 - 145 mmol/L   Potassium 4.5 3.5 - 5.1 mmol/L   Chloride 102 101 - 111 mmol/L   CO2 26 22 - 32 mmol/L   Glucose, Bld 101 (H) 65 - 99 mg/dL   BUN 25 (H) 6 - 20 mg/dL   Creatinine, Ser 2.23 (H) 0.61 - 1.24 mg/dL   Calcium 9.3 8.9 - 10.3 mg/dL   Total Protein 7.7 6.5 - 8.1 g/dL   Albumin 4.4 3.5 - 5.0 g/dL   AST 23 15 - 41 U/L   ALT 17 17 - 63 U/L   Alkaline Phosphatase 52 38 - 126 U/L   Total Bilirubin 0.8 0.3 - 1.2  mg/dL   GFR calc non Af Amer 27 (L) >60 mL/min   GFR calc Af Amer 32 (L) >60 mL/min    Comment: (NOTE) The eGFR has been calculated using the CKD EPI equation. This calculation has not been validated in all clinical situations. eGFR's persistently <60 mL/min signify possible Chronic Kidney Disease.    Anion gap 10 5 - 15  Lipase, blood     Status: None   Collection Time: 11/26/16  8:50 AM  Result Value Ref Range   Lipase 24 11 - 51 U/L  CBC with Differential     Status: Abnormal   Collection Time: 11/26/16  8:50 AM  Result Value Ref Range   WBC 12.3 (H) 4.0 - 10.5 K/uL   RBC 4.77 4.22 - 5.81 MIL/uL   Hemoglobin 14.0 13.0 - 17.0 g/dL   HCT 41.8 39.0 - 52.0 %   MCV 87.6 78.0 - 100.0 fL   MCH 29.4 26.0 - 34.0 pg   MCHC 33.5 30.0 - 36.0 g/dL   RDW 14.2 11.5 - 15.5 %   Platelets 203 150 - 400 K/uL   Neutrophils Relative % 73 %   Neutro Abs 9.0 (H) 1.7 - 7.7 K/uL   Lymphocytes Relative 14 %   Lymphs Abs 1.7 0.7 - 4.0 K/uL   Monocytes Relative 12 %   Monocytes Absolute 1.5 (H) 0.1 - 1.0 K/uL   Eosinophils Relative 1 %   Eosinophils Absolute 0.1 0.0 - 0.7 K/uL   Basophils Relative 0 %   Basophils Absolute 0.0 0.0 - 0.1 K/uL  Urinalysis, Routine w reflex microscopic     Status: Abnormal   Collection Time: 11/26/16 12:21 PM  Result Value Ref Range   Color, Urine YELLOW YELLOW   APPearance CLEAR CLEAR   Specific Gravity, Urine 1.018 1.005 - 1.030   pH 5.0 5.0 - 8.0   Glucose, UA NEGATIVE NEGATIVE mg/dL   Hgb urine dipstick MODERATE (A) NEGATIVE   Bilirubin Urine NEGATIVE NEGATIVE   Ketones, ur 5 (A) NEGATIVE mg/dL   Protein, ur NEGATIVE NEGATIVE mg/dL   Nitrite NEGATIVE NEGATIVE   Leukocytes, UA NEGATIVE NEGATIVE   RBC / HPF 6-30 0 - 5 RBC/hpf   WBC, UA 0-5 0 - 5 WBC/hpf   Bacteria, UA NONE SEEN NONE SEEN   No results found for this or any previous visit (from the past 240 hour(s)). Creatinine:  Recent Labs  11/24/16 1738 11/26/16 0850  CREATININE 1.99* 2.23*     Xrays: See report/chart As above reviewed  Impression/Assessment:  Left renal stone with colic OR SCHEDULED FOR 2 PM TODAY KEEP NPO  Plan:  Picture drawn; cysto and retro and stent and eventual ESWL; pros and cons and risks and sequelae discussed including injury and  perc access After a thorough review of the management options for the patient's condition the patient  elected to proceed with surgical therapy as noted above. We have discussed the potential benefits and risks of the procedure, side effects of the proposed treatment, the likelihood of the patient achieving the goals of the procedure, and any potential problems that might occur during the procedure or recuperation. Informed consent has been obtained.  Shatarra Wehling A 11/26/2016, 1:19 PM

## 2016-11-27 NOTE — Progress Notes (Signed)
PROGRESS NOTE  [name] [name]  [id number] DOB: 11/13/1940 DOA: 11/26/2016 PCP: Tula Nakayama, MD  Outpatient Specialists: [facility]  Brief Narrative: [name] [name] is a 76 y.o. male with a history of HLD, HTN, GERD presents to the hospital with complaints of recurrent left flank/left abdominal pain that radiated down to his left groin. By mouth intake has been poor. He was evaluated in the emergency room on 11/24/16 and was found to have a left-sided kidney stone, treated with pain medications and reported improvement. Plans were to follow up with urology. Since discharge, he is having worsening pain and discomfort causing return to the ED. Repeat imaging indicates persistent left-sided kidney stone and associated hydronephrosis. The patient was transferred from [facility] to [facility] for further urology evaluation and stent placement. He has had poor pain control since that time.   Assessment & Plan: Active Problems:   Essential hypertension   GERD (gastroesophageal reflux disease)   Renal colic   AKI (acute kidney injury) ([place])   Hydronephrosis, left   BPH (benign prostatic hyperplasia)   Hyperlipidemia   Constipation  Renal colic with left-sided UPJ stone and associated left hydronephrosis:  - Undergoing sten placement 11/27/2016.  - Augment pain medications: fentanyl 50 - 100 mcg q2h prn, anticipate improvement post-op.  - With perinephric stranding on imaging, ceftriaxone was started. Will follow-up urine culture and DC if negative.  Acute kidney injury: Likely related to decreased by mouth intake and dehydration with element of postrenal azotemia possibly.  - On IVF's - Monitor BMP in AM  Constipation. Patient reports taking mineral oil enema at home without significant benefit.  - Resolved s/p milk of molasses enema. - Senna BID for prophylaxis as he's getting opioids  Hypertension. Continue on home dose of beta blockers.  BPH.  - Continue on  Flomax.  GERD.  - Continue on PPI  DVT prophylaxis: SCDs Code Status: Full Family Communication: None at bedside this AM Disposition Plan: Anticipate DC home if creatinine improving and pain controlled in AM  Consultants:   Urology, Dr. Matilde Sprang  Procedures:   Ureteral stent placement 11/27/2016 by Dr. Ila Mcgill  Antimicrobials:  Ceftriaxone 2/7 >>    Subjective: Pt with uncontrolled pain as described above. Fentanyl 37mcg did not completely alleviate pain. No emesis, producing normal urine.   Objective: BP (!) 154/84 (BP Location: Left Arm)   Pulse (!) 58   Temp 98.1 F (36.7 C)   Resp 15   SpO2 98%     Gross per 24 hour  Intake          1793.75 ml  Output             1125 ml  Net           668.75 ml   General exam: 76 y.o. male in no distress  Respiratory system: Non-labored breathing room air. Clear to auscultation bilaterally.  Cardiovascular system: Regular rate and rhythm. No murmur, rub, or gallop. No JVD, and no pedal edema. Gastrointestinal system: Abdomen soft, mild left-sided tenderness that includes flank, non-distended, with normoactive bowel sounds. No organomegaly or masses felt. Central nervous system: Alert and oriented. No focal neurological deficits. Extremities: Warm, no deformities Skin: No rashes, lesions no ulcers Psychiatry: Judgement and insight appear normal. Mood & affect appropriate.   Data Reviewed: I have personally reviewed following labs and imaging studies  CBC:  Recent Labs Lab 11/24/16 1738 11/26/16 0850 11/27/16 0600  WBC 9.8 12.3* 8.7  NEUTROABS  --  9.0*  --   HGB 13.3 14.0 12.5*  HCT 39.3 41.8 38.0*  MCV 86.8 87.6 86.8  PLT 177 203 [id number]   Basic Metabolic Panel:  Recent Labs Lab 11/24/16 1738 11/26/16 0850 11/27/16 0600  NA 137 138 139  K 4.3 4.5 4.4  CL 103 102 106  CO2 26 26 22   GLUCOSE 101* 101* 109*  BUN 20 25* 29*  CREATININE 1.99* 2.23* 2.26*  CALCIUM 8.8* 9.3 8.5*   GFR: Estimated Creatinine  Clearance: 33.2 mL/min (by C-G formula based on SCr of 2.26 mg/dL (H)). Liver Function Tests:  Recent Labs Lab 11/26/16 0850  AST 23  ALT 17  ALKPHOS 52  BILITOT 0.8  PROT 7.7  ALBUMIN 4.4    Recent Labs Lab 11/26/16 0850  LIPASE 24   No results for input(s): AMMONIA in the last 168 hours. Coagulation Profile: No results for input(s): INR, PROTIME in the last 168 hours. Cardiac Enzymes: No results for input(s): CKTOTAL, CKMB, CKMBINDEX, TROPONINI in the last 168 hours. BNP (last 3 results) No results for input(s): PROBNP in the last 8760 hours. HbA1C: No results for input(s): HGBA1C in the last 72 hours. CBG: No results for input(s): GLUCAP in the last 168 hours. Lipid Profile: No results for input(s): CHOL, HDL, LDLCALC, TRIG, CHOLHDL, LDLDIRECT in the last 72 hours. Thyroid Function Tests: No results for input(s): TSH, T4TOTAL, FREET4, T3FREE, THYROIDAB in the last 72 hours. Anemia Panel: No results for input(s): VITAMINB12, FOLATE, FERRITIN, TIBC, IRON, RETICCTPCT in the last 72 hours. Urine analysis:    Component Value Date/Time   COLORURINE YELLOW 11/26/2016 1221   APPEARANCEUR CLEAR 11/26/2016 1221   LABSPEC 1.018 11/26/2016 1221   PHURINE 5.0 11/26/2016 1221   GLUCOSEU NEGATIVE 11/26/2016 1221   GLUCOSEU NEG mg/dL 08/14/2010 0122   HGBUR MODERATE (A) 11/26/2016 1221   HGBUR trace-lysed 08/12/2010 1314   BILIRUBINUR NEGATIVE 11/26/2016 1221   KETONESUR 5 (A) 11/26/2016 1221   PROTEINUR NEGATIVE 11/26/2016 1221   UROBILINOGEN 0.2 08/14/2010 0122   NITRITE NEGATIVE 11/26/2016 1221   LEUKOCYTESUR NEGATIVE 11/26/2016 1221   Recent Results (from the past 240 hour(s))  Urine culture     Status: None   Collection Time: 11/26/16 12:21 PM  Result Value Ref Range Status   Specimen Description URINE, CLEAN CATCH  Final   Special Requests NONE  Final   Culture   Final    NO GROWTH Performed at [facility], [place] [address], [place], [place] [address]     Report Status 11/27/2016 FINAL  Final      Radiology Studies: US Renal  Result Date: 11/26/2016 CLINICAL DATA:  Flank pain EXAM: RENAL / URINARY TRACT ULTRASOUND COMPLETE COMPARISON:  11/24/2016 FINDINGS: Right Kidney: Length: 11.9 cm. 2.3 cm cyst is noted similar to that seen on the prior CT examination. The nonobstructing stones seen previously are not well appreciated on this exam. Mild cortical thinning is noted. Left Kidney: Length: 13.6 cm. Hydronephrosis is noted similar to that seen on the prior exam. The proximal ureteral stone is not well appreciated on this study. Bladder: Partially decompressed. Prominent prostate is noted similar to that seen on recent CT. IMPRESSION: Stable left hydronephrosis. Right renal cyst. Electronically Signed   By: Inez Catalina M.D.   On: 11/26/2016 09:21   Dg Abd Acute W/chest  Result Date: 11/26/2016 CLINICAL DATA:  Abdominal pain and distention.  Constipation. EXAM: DG ABDOMEN ACUTE W/ 1V CHEST COMPARISON:  Chest CT July 22, 2012; CT  abdomen and pelvis November 24, 2016 FINDINGS: PA chest: There is atelectatic change in the right base and left upper lobe regions. Lungs elsewhere are clear. Heart size and pulmonary vascularity are normal. No adenopathy. Note that there is an apparent nipple shadow on the right. Supine and upright abdomen: 7 mm calculus is noted at the level of L3-4 on the left. There is moderate stool in the colon. There is no bowel dilatation or air-fluid level suggesting bowel obstruction. No free air. IMPRESSION: Calculus proximal left ureter at the level of L3-4 on the left. No bowel obstruction or free air. Moderate stool in colon. Patchy atelectasis left upper lobe and right base regions. No airspace consolidation. Electronically Signed   By: Lowella Grip III M.D.   On: 11/26/2016 09:30   Ct Renal Stone Study  Result Date: 11/26/2016 CLINICAL DATA:  Left flank pain for 1 week with constipation. Recent obstructive ureteral stone. EXAM:  CT ABDOMEN AND PELVIS WITHOUT CONTRAST TECHNIQUE: Multidetector CT imaging of the abdomen and pelvis was performed following the standard protocol without IV contrast. COMPARISON:  11/26/2016 radiographs FINDINGS: Lower chest: 6 by 4 mm right lower lobe nodule, not appreciably changed from 08/30/2010. Mild atelectasis in both lower lobes and in the right middle lobe. Left anterior descending coronary artery atherosclerotic calcification. Hepatobiliary: Unremarkable Pancreas: Unremarkable Spleen: Unremarkable Adrenals/Urinary Tract: Adrenal glands normal. Nonobstructive right nephrolithiasis with about 6 nonobstructive calculi observed, the largest 3 mm in diameter. Right peripelvic cysts. Normal-appearing right ureter. 2.4 by 2.0 cm right kidney lower pole partially exophytic hypodense lesion posteriorly, 8 Hounsfield units. There is also a mildly hyperdense 6 mm right kidney lower pole lesion on image 43/2 which is stable and possibly a complex cyst. Left hydronephrosis due to a 7 mm in long axis left UPJ calculus, image 45/2. There is periureteral stranding extending along the proximal half of the ureter but no distal stone seen. In addition there are approximately 5 additional nonobstructive left renal calculi and mildly asymmetric left perirenal stranding. Stomach/Bowel: Unremarkable Vascular/Lymphatic: Mild aortoiliac atherosclerotic vascular calcification. Small retroperitoneal lymph nodes are not pathologically enlarged by size criteria. Reproductive: Prostate gland measures 4.9 by 4.3 by 6.0 cm (volume = 66 cm^3), with median lobe indenting the bladder base. Other: No supplemental non-categorized findings. Musculoskeletal: 7 mm of degenerative grade 1 anterolisthesis at L5-S1 with facet arthropathy causing moderate left and mild right foraminal stenosis at this level. Lumbar spondylosis and degenerative disc disease also cause central narrowing of the thecal sac at L3-4 and possibly L1-2 and L2-3 ; and  foraminal impingement at the L4-5 level. Lower thoracic spondylosis is also present. A small umbilical hernia contains adipose tissue. IMPRESSION: 1. Similar appearance to prior CT, with a nonobstructive left UPJ calculus causing left hydronephrosis and asymmetric left perirenal stranding. This stone measures 7 mm in long axis. There is periureteral stranding in the proximal half of the left ureter but no distal stone is seen. 2. Multiple tiny punctate bilateral nonobstructive renal calculi are also present. 3. Enlarged prostate gland, 66 cubic cm. 4. Lumbar spondylosis and degenerative disc disease with 7 mm of degenerative anterolisthesis at that L5 on S1, and multilevel suspected impingement as noted above. 5. Chronically stable 5 mm right lower lobe pulmonary nodule, benign. 6. Coronary and aortoiliac atherosclerotic calcification. Electronically Signed   By: Van Clines M.D.   On: 11/26/2016 12:00    Scheduled Meds: . [MAR Hold] aspirin EC  81 mg Oral q morning - 10a  . [facility] Hold]  cefTRIAXone (ROCEPHIN)  IV  1 g Intravenous Q24H  . [MAR Hold] metoprolol succinate  50 mg Oral Daily  . [MAR Hold] pantoprazole  40 mg Oral Daily  . [MAR Hold] simvastatin  40 mg Oral QHS  . [MAR Hold] tamsulosin  0.4 mg Oral QHS   Continuous Infusions: . sodium chloride 75 mL/hr at 11/27/16 1140     LOS: 1 day   Time spent: 25 minutes.  Vance Gather, MD [facility] Pager [phone]  If 7PM-7AM, please contact night-coverage [email or web address] Password [email or web address] 11/27/2016, 5:08 PM

## 2016-11-27 NOTE — Care Management Note (Signed)
Case Management Note  Patient Details  Name: [name] [name] MRN: [id number] Date of Birth: September 16, 1941  Subjective/Objective:  76 y/o m admitted w/renal colic. From home.                  Action/Plan:d/c plan home.   Expected Discharge Date:                  Expected Discharge Plan:  Home/Self Care  In-House Referral:     Discharge planning Services  CM Consult  Post Acute Care Choice:    Choice offered to:     DME Arranged:    DME Agency:     HH Arranged:    HH Agency:     Status of Service:  In process, will continue to follow  If discussed at Long Length of Stay Meetings, dates discussed:    Additional Comments:  Dessa Phi, RN 11/27/2016, 1:12 PM

## 2016-11-27 NOTE — Transfer of Care (Signed)
Immediate Anesthesia Transfer of Care Note  Patient: [name] [name]  Procedure(s) Performed: Procedure(s): CYSTOSCOPY WITH RETROGRADE PYELOGRAM/URETERAL STENT PLACEMENT (Left)  Patient Location: PACU  Anesthesia Type:General  Level of Consciousness:  sedated, patient cooperative and responds to stimulation  Airway & Oxygen Therapy:Patient Spontanous Breathing and Patient connected to face mask oxgen  Post-op Assessment:  Report given to PACU RN and Post -op Vital signs reviewed and stable  Post vital signs:  Reviewed and stable  Last Vitals:  Vitals:   11/27/16 0947 11/27/16 1355  BP: (!) 151/83 (!) 155/78  Pulse: 67 62  Resp:  18  Temp:  123XX123 C    Complications: No apparent anesthesia complications

## 2016-11-27 NOTE — Anesthesia Postprocedure Evaluation (Addendum)
Anesthesia Post Note  Patient: [name] [name]  Procedure(s) Performed: Procedure(s) (LRB): CYSTOSCOPY WITH RETROGRADE PYELOGRAM/URETERAL STENT PLACEMENT (Left)  Patient location during evaluation: PACU Anesthesia Type: General Level of consciousness: awake Pain management: pain level controlled Vital Signs Assessment: post-procedure vital signs reviewed and stable Respiratory status: spontaneous breathing, nonlabored ventilation, respiratory function stable and patient connected to nasal cannula oxygen Cardiovascular status: blood pressure returned to baseline and stable Postop Assessment: no signs of nausea or vomiting Anesthetic complications: no       Last Vitals:  Vitals:   11/27/16 1615 11/27/16 1630  BP: (!) 162/87 (!) 154/83  Pulse: 68 63  Resp: 14 17  Temp:      Last Pain:  Vitals:   11/27/16 1630  TempSrc:   PainSc: 0-No pain                 Torrance Frech

## 2016-11-28 DIAGNOSIS — N23 Unspecified renal colic: Secondary | ICD-10-CM | POA: Diagnosis not present

## 2016-11-28 DIAGNOSIS — N179 Acute kidney failure, unspecified: Secondary | ICD-10-CM | POA: Diagnosis not present

## 2016-11-28 DIAGNOSIS — N132 Hydronephrosis with renal and ureteral calculous obstruction: Secondary | ICD-10-CM | POA: Diagnosis not present

## 2016-11-28 DIAGNOSIS — N1330 Unspecified hydronephrosis: Secondary | ICD-10-CM | POA: Diagnosis not present

## 2016-11-28 DIAGNOSIS — N201 Calculus of ureter: Secondary | ICD-10-CM | POA: Diagnosis not present

## 2016-11-28 LAB — BASIC METABOLIC PANEL WITH GFR
Anion gap: 7 (ref 5–15)
BUN: 28 mg/dL — ABNORMAL HIGH (ref 6–20)
CO2: 25 mmol/L (ref 22–32)
Calcium: 8.4 mg/dL — ABNORMAL LOW (ref 8.9–10.3)
Chloride: 107 mmol/L (ref 101–111)
Creatinine, Ser: 1.85 mg/dL — ABNORMAL HIGH (ref 0.61–1.24)
GFR calc Af Amer: 40 mL/min — ABNORMAL LOW
GFR calc non Af Amer: 34 mL/min — ABNORMAL LOW
Glucose, Bld: 104 mg/dL — ABNORMAL HIGH (ref 65–99)
Potassium: 4.1 mmol/L (ref 3.5–5.1)
Sodium: 139 mmol/L (ref 135–145)

## 2016-11-28 MED ORDER — SENNOSIDES-DOCUSATE SODIUM 8.6-50 MG PO TABS: 1.0000 | ORAL_TABLET | Freq: Every day | ORAL | 0 refills | Status: DC

## 2016-11-28 MED ORDER — BELLADONNA ALKALOIDS-OPIUM 16.2-60 MG RE SUPP
1.0000 | Freq: Four times a day (QID) | RECTAL | Status: DC | PRN
  Administered 2016-11-28: 1 via RECTAL
  Filled 2016-11-28: qty 1

## 2016-11-28 NOTE — Discharge Summary (Signed)
Physician Discharge Summary  [name] [name] [id number] DOB: Mar 04, 1941 DOA: 11/26/2016  PCP: Tula Nakayama, MD  Admit date: 11/26/2016 Discharge date: 11/28/2016  Admitted From: Home Disposition: Home   Recommendations for Outpatient Follow-up:  1. Follow up with PCP in 1-2 weeks with repeat BMP to monitor renal function.  2. Pt will call [facility] 2/12 to follow up.  Home Health: None Equipment/Devices: None Discharge Condition: Stable CODE STATUS: Full Diet recommendation: Regular  Brief/Interim Summary: [name] [name] a 76 y.o.malewith a history of HLD, HTN, GERD presents to the hospital with complaints of recurrent left flank/left abdominal pain that radiated down to his left groin. By mouth intake has been poor. He was evaluated in the emergency room on 11/24/16 and was found to have a left-sided kidney stone, treated with pain medications and reported improvement. Plans were to follow up with urology. Since discharge, he is having worsening pain and discomfort causing return to the ED. Repeat imaging indicates persistent left-sided kidney stone and associated hydronephrosis. The patient was transferred from [facility] to [facility] for further urology evaluation and stent placement. Stent was placed 2/8 by Dr. Matilde Sprang and post-op course has been uneventful.   Discharge Diagnoses:  Active Problems:   Essential hypertension   GERD (gastroesophageal reflux disease)   Renal colic   AKI (acute kidney injury) ([place])   Hydronephrosis, left   BPH (benign prostatic hyperplasia)   Hyperlipidemia   Constipation  Renal colic with left-sided UPJ stone and associated left hydronephrosis:  - s/p stent placement 11/27/2016 by Dr. Matilde Sprang.  - Oxycodone prescribed by urology for pain. - Ditropan prescribed for bladder spasm - Pt to call Monday for follow up. - With perinephric stranding on imaging, ceftriaxone was started. UA aappeared uninfected and urine culture showed  no growth so antibiotics were discontinued.  Acute kidney injury: Likely related to decreased by mouth intake and dehydration with element of postrenal azotemia possibly. Improved overnight - Recommend follow up.  - Taking good po following procedure.   Constipation. Patient reports taking mineral oil enema at home without significant benefit.  - Resolved s/p milk of molasses enema. - Senna BID was given for prophylaxis as he's getting opioids, prescription given.  Discharge Instructions Discharge Instructions    Discharge instructions    Complete by:  As directed    You were admitted for pain related to the kidney stone, improved with stenting. You are stable for discharge with the following recommendations:  - Continue taking oxycodone as needed for pain and ditropan as directed - As long as you are taking oxycodone you should take senna (prescribed for you) or another laxative to prevent constipation - Call Dr. Mikle Bosworth office Monday for follow up - Call your PCP for follow up in the next 1 - 2 weeks for hospital follow up with lab recheck.     Allergies as of 11/28/2016      Reactions   Ace Inhibitors Cough   Family member not sure if he is allergic or not.      Medication List    TAKE these medications   acetaminophen 325 MG tablet Commonly known as:  TYLENOL Take 325 mg by mouth as needed for mild pain.   ASPIRIN LOW DOSE 81 MG EC tablet Generic drug:  aspirin Take 81 mg by mouth every morning.   BLACK CHERRY CONCENTRATE PO Take by mouth.   butalbital-acetaminophen-caffeine 50-325-40 MG tablet Commonly known as:  ESGIC Take 1 tablet by mouth every 6 (six)  hours as needed for headache. No more than twice weekly   Krill Oil 300 MG Caps Take 1 capsule by mouth daily.   loratadine 10 MG tablet Commonly known as:  CLARITIN Take 10 mg by mouth daily.   metoprolol succinate 50 MG 24 hr tablet Commonly known as:  TOPROL-XL Take 1 tablet (50 mg total) by mouth  daily. Take with or immediately following a meal.   multivitamin with minerals Tabs tablet Take 1 tablet by mouth daily.   omeprazole 20 MG capsule Commonly known as:  PRILOSEC TAKE ONE CAPSULE BY MOUTH ONCE DAILY IN THE MORNING   ondansetron 4 MG tablet Commonly known as:  ZOFRAN Take 1 tablet (4 mg total) by mouth every 6 (six) hours.   oxyCODONE-acetaminophen 5-325 MG tablet Commonly known as:  PERCOCET/ROXICET Take 1 tablet by mouth every 6 (six) hours as needed.   senna-docusate 8.6-50 MG tablet Commonly known as:  Senokot-S Take 1 tablet by mouth daily. while taking opioid pain medications   simvastatin 40 MG tablet Commonly known as:  ZOCOR TAKE ONE TABLET BY MOUTH AT BEDTIME   tamsulosin 0.4 MG Caps capsule Commonly known as:  FLOMAX Take 1 capsule (0.4 mg total) by mouth daily. Take at hs.   terazosin 2 MG capsule Commonly known as:  HYTRIN TAKE ONE CAPSULE BY MOUTH AT BEDTIME      Follow-up Information    Tula Nakayama, MD. Schedule an appointment as soon as possible for a visit in 1 week(s).   Specialty:  Family Medicine Contact information: [address], [place] [address] [phone]        Reece Packer, MD. Call on 12/01/2016.   Specialty:  Urology Contact information: [address] [place] [address] [phone]          Allergies  Allergen Reactions  . Ace Inhibitors Cough    Family member not sure if he is allergic or not.    Consultations:  Urology, Dr. Matilde Sprang  Procedures/Studies: US Renal  Result Date: 11/26/2016 CLINICAL DATA:  Flank pain EXAM: RENAL / URINARY TRACT ULTRASOUND COMPLETE COMPARISON:  11/24/2016 FINDINGS: Right Kidney: Length: 11.9 cm. 2.3 cm cyst is noted similar to that seen on the prior CT examination. The nonobstructing stones seen previously are not well appreciated on this exam. Mild cortical thinning is noted. Left Kidney: Length: 13.6 cm. Hydronephrosis is noted similar to  that seen on the prior exam. The proximal ureteral stone is not well appreciated on this study. Bladder: Partially decompressed. Prominent prostate is noted similar to that seen on recent CT. IMPRESSION: Stable left hydronephrosis. Right renal cyst. Electronically Signed   By: Inez Catalina M.D.   On: 11/26/2016 09:21   Dg Abd Acute W/chest  Result Date: 11/26/2016 CLINICAL DATA:  Abdominal pain and distention.  Constipation. EXAM: DG ABDOMEN ACUTE W/ 1V CHEST COMPARISON:  Chest CT July 22, 2012; CT abdomen and pelvis November 24, 2016 FINDINGS: PA chest: There is atelectatic change in the right base and left upper lobe regions. Lungs elsewhere are clear. Heart size and pulmonary vascularity are normal. No adenopathy. Note that there is an apparent nipple shadow on the right. Supine and upright abdomen: 7 mm calculus is noted at the level of L3-4 on the left. There is moderate stool in the colon. There is no bowel dilatation or air-fluid level suggesting bowel obstruction. No free air. IMPRESSION: Calculus proximal left ureter at the level of L3-4 on the left. No bowel obstruction  or free air. Moderate stool in colon. Patchy atelectasis left upper lobe and right base regions. No airspace consolidation. Electronically Signed   By: Lowella Grip III M.D.   On: 11/26/2016 09:30   Ct Renal Stone Study  Result Date: 11/26/2016 CLINICAL DATA:  Left flank pain for 1 week with constipation. Recent obstructive ureteral stone. EXAM: CT ABDOMEN AND PELVIS WITHOUT CONTRAST TECHNIQUE: Multidetector CT imaging of the abdomen and pelvis was performed following the standard protocol without IV contrast. COMPARISON:  11/26/2016 radiographs FINDINGS: Lower chest: 6 by 4 mm right lower lobe nodule, not appreciably changed from 08/30/2010. Mild atelectasis in both lower lobes and in the right middle lobe. Left anterior descending coronary artery atherosclerotic calcification. Hepatobiliary: Unremarkable Pancreas: Unremarkable  Spleen: Unremarkable Adrenals/Urinary Tract: Adrenal glands normal. Nonobstructive right nephrolithiasis with about 6 nonobstructive calculi observed, the largest 3 mm in diameter. Right peripelvic cysts. Normal-appearing right ureter. 2.4 by 2.0 cm right kidney lower pole partially exophytic hypodense lesion posteriorly, 8 Hounsfield units. There is also a mildly hyperdense 6 mm right kidney lower pole lesion on image 43/2 which is stable and possibly a complex cyst. Left hydronephrosis due to a 7 mm in long axis left UPJ calculus, image 45/2. There is periureteral stranding extending along the proximal half of the ureter but no distal stone seen. In addition there are approximately 5 additional nonobstructive left renal calculi and mildly asymmetric left perirenal stranding. Stomach/Bowel: Unremarkable Vascular/Lymphatic: Mild aortoiliac atherosclerotic vascular calcification. Small retroperitoneal lymph nodes are not pathologically enlarged by size criteria. Reproductive: Prostate gland measures 4.9 by 4.3 by 6.0 cm (volume = 66 cm^3), with median lobe indenting the bladder base. Other: No supplemental non-categorized findings. Musculoskeletal: 7 mm of degenerative grade 1 anterolisthesis at L5-S1 with facet arthropathy causing moderate left and mild right foraminal stenosis at this level. Lumbar spondylosis and degenerative disc disease also cause central narrowing of the thecal sac at L3-4 and possibly L1-2 and L2-3 ; and foraminal impingement at the L4-5 level. Lower thoracic spondylosis is also present. A small umbilical hernia contains adipose tissue. IMPRESSION: 1. Similar appearance to prior CT, with a nonobstructive left UPJ calculus causing left hydronephrosis and asymmetric left perirenal stranding. This stone measures 7 mm in long axis. There is periureteral stranding in the proximal half of the left ureter but no distal stone is seen. 2. Multiple tiny punctate bilateral nonobstructive renal calculi  are also present. 3. Enlarged prostate gland, 66 cubic cm. 4. Lumbar spondylosis and degenerative disc disease with 7 mm of degenerative anterolisthesis at that L5 on S1, and multilevel suspected impingement as noted above. 5. Chronically stable 5 mm right lower lobe pulmonary nodule, benign. 6. Coronary and aortoiliac atherosclerotic calcification. Electronically Signed   By: Van Clines M.D.   On: 11/26/2016 12:00   Ct Renal Stone Study  Result Date: 11/24/2016 CLINICAL DATA:  Left flank pain intermittent since last Tuesday. EXAM: CT ABDOMEN AND PELVIS WITHOUT CONTRAST TECHNIQUE: Multidetector CT imaging of the abdomen and pelvis was performed following the standard protocol without IV contrast. COMPARISON:  06/23/2008 CT, lumbar spine radiographs from 12/10/2015. FINDINGS: Lower chest: Atelectasis at each lung base. Top normal size cardiac chambers. Aortic atherosclerosis. Hepatobiliary: No focal liver abnormality is seen. No gallstones, gallbladder wall thickening, or biliary dilatation. Pancreas: No ductal dilatation or focal mass given limitations of a noncontrast study. Spleen: Normal Adrenals/Urinary Tract: Normal bilateral adrenal glands. Punctate 1-2 mm bilateral renal calculi. Parapelvic renal cysts. Left UPJ calculus measuring 7 x 6 x  5 mm in craniocaudad by transverse by AP dimension. This accounts for moderate left-sided proximal hydroureteronephrosis. Stable right lower pole 2.5 cm simple cyst. The bladder is nondistended. Stomach/Bowel: Stomach is within normal limits. Appendix appears normal. No evidence of bowel wall thickening, distention, or inflammatory changes. Vascular/Lymphatic: No aortic aneurysm. Minimal aortic atherosclerosis. No lymphadenopathy. Reproductive: No prostatic enlargement. Other: No abdominal wall hernia or abnormality. No abdominopelvic ascites. Musculoskeletal: L4-5 and L5-S1 facet arthropathy. No acute osseous abnormality. Grade 1 anterolisthesis of L5 on S1.  IMPRESSION: 1. 7 x 6 x 5 mm left UPJ stone causing moderate left-sided hydroureteronephrosis. 2. Bilateral punctate nonobstructing renal calculi, bilateral parapelvic renal cysts and stable right lower pole cortical 2.5 cm cyst. 3. Lower lumbar degenerative facet arthropathy with grade 1 anterolisthesis of L5 on S1. No acute osseous abnormality. Electronically Signed   By: Ashley Royalty M.D.   On: 11/24/2016 18:40    Subjective: Pt feels well, pain much improved, tolerating po. Normal UOP. Ready to go home.   Discharge Exam: Vitals:   11/27/16 2130 11/28/16 0510  BP: 138/88 134/77  Pulse: 67 63  Resp: 18 20  Temp: 98.4 F (36.9 C) 98.9 F (37.2 C)   General: Pt is alert, awake, not in acute distress Cardiovascular: RRR, S1/S2 +, no rubs, no gallops Respiratory: CTA bilaterally, no wheezing, no rhonchi Abdominal: Soft, NT, ND, bowel sounds + Extremities: No edema, no cyanosis  The results of significant diagnostics from this hospitalization (including imaging, microbiology, ancillary and laboratory) are listed below for reference.    Labs: Basic Metabolic Panel:  Recent Labs Lab 11/24/16 1738 11/26/16 0850 11/27/16 0600 11/28/16 0442  NA 137 138 139 139  K 4.3 4.5 4.4 4.1  CL 103 102 106 107  CO2 26 26 22 25   GLUCOSE 101* 101* 109* 104*  BUN 20 25* 29* 28*  CREATININE 1.99* 2.23* 2.26* 1.85*  CALCIUM 8.8* 9.3 8.5* 8.4*   Liver Function Tests:  Recent Labs Lab 11/26/16 0850  AST 23  ALT 17  ALKPHOS 52  BILITOT 0.8  PROT 7.7  ALBUMIN 4.4    Recent Labs Lab 11/26/16 0850  LIPASE 24   CBC:  Recent Labs Lab 11/24/16 1738 11/26/16 0850 11/27/16 0600  WBC 9.8 12.3* 8.7  NEUTROABS  --  9.0*  --   HGB 13.3 14.0 12.5*  HCT 39.3 41.8 38.0*  MCV 86.8 87.6 86.8  PLT 177 203 189   Urinalysis    Component Value Date/Time   COLORURINE YELLOW 11/26/2016 1221   APPEARANCEUR CLEAR 11/26/2016 1221   LABSPEC 1.018 11/26/2016 1221   PHURINE 5.0 11/26/2016 1221    GLUCOSEU NEGATIVE 11/26/2016 1221   GLUCOSEU NEG mg/dL 08/14/2010 0122   HGBUR MODERATE (A) 11/26/2016 1221   HGBUR trace-lysed 08/12/2010 1314   BILIRUBINUR NEGATIVE 11/26/2016 1221   KETONESUR 5 (A) 11/26/2016 1221   PROTEINUR NEGATIVE 11/26/2016 1221   UROBILINOGEN 0.2 08/14/2010 0122   NITRITE NEGATIVE 11/26/2016 1221   LEUKOCYTESUR NEGATIVE 11/26/2016 1221    Microbiology Recent Results (from the past 240 hour(s))  Urine culture     Status: None   Collection Time: 11/26/16 12:21 PM  Result Value Ref Range Status   Specimen Description URINE, CLEAN CATCH  Final   Special Requests NONE  Final   Culture   Final    NO GROWTH Performed at [facility], [place] [address], [place], [place] [address]    Report Status 11/27/2016 FINAL  Final  Surgical pcr screen  Status: None   Collection Time: 11/27/16  2:07 PM  Result Value Ref Range Status   MRSA, PCR NEGATIVE NEGATIVE Final   Staphylococcus aureus NEGATIVE NEGATIVE Final    Comment:        The Xpert SA Assay (FDA approved for NASAL specimens in patients over 17 years of age), is one component of a comprehensive surveillance program.  Test performance has been validated by [facility] for patients greater than or equal to 25 year old. It is not intended to diagnose infection nor to guide or monitor treatment.     Time coordinating discharge: Approximately 40 minutes  Vance Gather, MD  [facility] 11/28/2016, 9:03 AM Pager [phone]

## 2016-11-28 NOTE — Progress Notes (Signed)
Patient discharge home with family, discharge instructions/prescription given/explained to patient/family and they verbalized understanding. Patient denies any pain/distress, no wound noted, skin intact. Accompanied home by wife/daughter. Transported to the car by staff via wheelchair.

## 2016-11-28 NOTE — Progress Notes (Signed)
Painful bladder spasms better now Vitals good Labs improving I gave him a oxycodone RX and ditropan RX He will call us Monday phone given Send home today if ok with primary team-thanks

## 2016-11-30 ENCOUNTER — Telehealth: Payer: Self-pay | Admitting: Family Medicine

## 2016-11-30 NOTE — Telephone Encounter (Signed)
Will have appointment scheduled to see me the week of Feb Feb 20, and he needs to have lab drawn non fasting that day Will also ask referral staff/ [facility]  to assist if needed, in scheduling appt with ology. Pt has medications he was d/c on at home. There are no transportation barriers for him to get to his appts, wife and dsaaughter bot drive

## 2016-11-30 NOTE — Telephone Encounter (Signed)
Pt d/c from hospital on 11/28/2016, admitted with right ureteral stone with obstructio I spoke directly with his wife Hassan Rowan who has been with him the entire time from hospitalization on 2/8 to the present. She understands that the severe lower abdominal/ pelvic pain was due to the kidney stone, and that he needs follow up appt with urology for lithotripsy, she is to call to schedule the appt. States pain of bladder spasm was even worse than that of the stone, however , with the medications he is d/c on , he is doing a bit better. Appetite is still reduced, but she knows that it is unrestricted , and I encouraged him to push fluids. He has had a BM today and feels relieeved/

## 2016-12-02 ENCOUNTER — Encounter: Payer: Self-pay | Admitting: Family Medicine

## 2016-12-02 ENCOUNTER — Ambulatory Visit (INDEPENDENT_AMBULATORY_CARE_PROVIDER_SITE_OTHER): Payer: PPO | Admitting: Family Medicine

## 2016-12-02 VITALS — BP 112/78 | HR 101 | Resp 15 | Ht 70.0 in | Wt 205.4 lb

## 2016-12-02 DIAGNOSIS — I10 Essential (primary) hypertension: Secondary | ICD-10-CM | POA: Diagnosis not present

## 2016-12-02 DIAGNOSIS — Z09 Encounter for follow-up examination after completed treatment for conditions other than malignant neoplasm: Secondary | ICD-10-CM

## 2016-12-02 NOTE — Patient Instructions (Addendum)
F/U as before, thankful [facility]  CBC and Non fasting chem 7 and EGFR this  week Friday morning at the hospital  Thanks for choosing [facility], we consider it a privelige to serve you.

## 2016-12-04 DIAGNOSIS — N202 Calculus of kidney with calculus of ureter: Secondary | ICD-10-CM | POA: Diagnosis not present

## 2016-12-05 ENCOUNTER — Encounter: Payer: Self-pay | Admitting: Family Medicine

## 2016-12-05 ENCOUNTER — Other Ambulatory Visit (HOSPITAL_COMMUNITY)
Admission: RE | Admit: 2016-12-05 | Discharge: 2016-12-05 | Disposition: A | Discharge status: Home / Self Care | Payer: PPO | Source: Ambulatory Visit | Attending: Family Medicine | Admitting: Family Medicine

## 2016-12-05 ENCOUNTER — Other Ambulatory Visit: Payer: Self-pay | Admitting: Urology

## 2016-12-05 ENCOUNTER — Encounter (HOSPITAL_COMMUNITY): Payer: Self-pay | Admitting: *Deleted

## 2016-12-05 DIAGNOSIS — Z09 Encounter for follow-up examination after completed treatment for conditions other than malignant neoplasm: Secondary | ICD-10-CM | POA: Insufficient documentation

## 2016-12-05 DIAGNOSIS — N189 Chronic kidney disease, unspecified: Secondary | ICD-10-CM | POA: Diagnosis not present

## 2016-12-05 LAB — CBC
HCT: 39.9 % (ref 39.0–52.0)
Hemoglobin: 13.3 g/dL (ref 13.0–17.0)
MCH: 29.1 pg (ref 26.0–34.0)
MCHC: 33.3 g/dL (ref 30.0–36.0)
MCV: 87.3 fL (ref 78.0–100.0)
Platelets: 298 10*3/uL (ref 150–400)
RBC: 4.57 MIL/uL (ref 4.22–5.81)
RDW: 14 % (ref 11.5–15.5)
WBC: 7.4 10*3/uL (ref 4.0–10.5)

## 2016-12-05 LAB — BASIC METABOLIC PANEL
Anion gap: 7 (ref 5–15)
BUN: 19 mg/dL (ref 6–20)
CO2: 26 mmol/L (ref 22–32)
Calcium: 8.8 mg/dL — ABNORMAL LOW (ref 8.9–10.3)
Chloride: 106 mmol/L (ref 101–111)
Creatinine, Ser: 1.42 mg/dL — ABNORMAL HIGH (ref 0.61–1.24)
GFR calc Af Amer: 55 mL/min — ABNORMAL LOW (ref >60)
GFR calc non Af Amer: 47 mL/min — ABNORMAL LOW (ref >60)
Glucose, Bld: 89 mg/dL (ref 65–99)
Potassium: 4.1 mmol/L (ref 3.5–5.1)
Sodium: 139 mmol/L (ref 135–145)

## 2016-12-05 NOTE — Assessment & Plan Note (Signed)
Hospital follow up as documented. Pt markedly improved, however still needs lithotripsy followed by removal of ureteral stent., Reports improve appetite, normal bowel movements and less abdominal pain Still weak and in recuperation mode Repeat labs to be obtained to ases renal function and blood count later this week

## 2016-12-05 NOTE — Progress Notes (Signed)
   [name] [name]     MRN: [id number]      DOB: 1941/02/21   HPI Mr. Lojek  Patient in for follow up of recent hospitalization from 2/7 to 11/2016 Discharge summary, and laboratory and radiology data are reviewed, and any questions or concerns about recent hospitalization are discussed. Specific issues requiring follow up are specifically addressed. Feels much improved since last seen when he presented to the office with left renal colic and obstruction and pre renal azotemia He currently has a ureteral stent placed, his appetite is returning to normal and he is having BM's Plan is for lithotripsy for definite mangement of the stone ROS See HPI  . Denies sinus pressure, nasal congestion, ear pain or sore throat. Denies chest congestion, productive cough or wheezing. Denies chest pains, palpitations and leg swelling  Denies joint pain, swelling and limitation in mobility. Denies headaches, seizures, numbness, or tingling. Denies depression, anxiety or insomnia. Denies skin break down or rash.   PE  BP 112/78   Pulse (!) 101   Resp 15   Ht 5\' 10"  (1.778 m)   Wt 205 lb 6.4 oz (93.2 kg)   SpO2 95%   BMI 29.47 kg/m   Patient alert and oriented and in no cardiopulmonary distress.  HEENT: No facial asymmetry, EOMI,   oropharynx pink and moist.  Neck supple no JVD, no mass.  Chest: Clear to auscultation bilaterally.  CVS: S1, S2 no murmurs, no S3.Regular rate.  ABD: Soft mild left lower abdominal tenderness, no guarding or rebound, normal bSExt: No edema  MS: Adequate ROM spine, shoulders, hips and knees.  Skin: Intact, no ulcerations or rash noted.  Psych: Good eye contact, normal affect. Memory intact not anxious or depressed appearing.  CNS: CN 2-12 intact, power,  normal throughout.no focal deficits noted.   [facility] discharge follow-up Hospital follow up as documented. Pt markedly improved, however still needs lithotripsy followed by  removal of ureteral stent., Reports improve appetite, normal bowel movements and less abdominal pain Still weak and in recuperation mode Repeat labs to be obtained to ases renal function and blood count later this week  Essential hypertension Controlled, no change in medication DASH diet and commitment to daily physical activity for a minimum of 30 minutes discussed and encouraged, as a part of hypertension management. The importance of attaining a healthy weight is also discussed.  BP/Weight 12/02/2016 11/28/2016 11/24/2016 10/09/2016 09/01/2016 08/18/2016 [id number]  Systolic BP [id number] 0000000 [id number]  Diastolic BP 78 99 90 84 82 86 94  Wt. (Lbs) 205.4 - 210 210 209 209.12 218  BMI 29.47 - 30.13 31.01 29.99 30.88 32.18

## 2016-12-05 NOTE — Assessment & Plan Note (Signed)
Controlled, no change in medication DASH diet and commitment to daily physical activity for a minimum of 30 minutes discussed and encouraged, as a part of hypertension management. The importance of attaining a healthy weight is also discussed.  BP/Weight 12/02/2016 11/28/2016 11/24/2016 10/09/2016 09/01/2016 08/18/2016 [id number]  Systolic BP [id number] 0000000 [id number] [id number] 0000000 [id number]  Diastolic BP 78 99 90 84 82 86 94  Wt. (Lbs) 205.4 - 210 210 209 209.12 218  BMI 29.47 - 30.13 31.01 29.99 30.88 32.18

## 2016-12-05 NOTE — H&P (Signed)
Office Visit Report     12/04/2016   --------------------------------------------------------------------------------   [name] [name]  MRN: [id number]  PRIMARY CARE:  [name] Nakayama, MD  DOB: 11/26/1940, 76 year old Male  REFERRING:  [name] Nakayama, MD  SSN: -[id number]  PROVIDER:  Bjorn Loser, M.D.    TREATING:  [name] [name]    LOCATION:  [facility] [phone]   --------------------------------------------------------------------------------   CC: I have kidney stones.(Surgery)  HPI: [name] [name] is a 76 year-old male established patient who is here for renal calculi after a surgical intervention.  This gentleman had to undergo placement of a ureteral stent related to an obstructing left-sided calculus 8 days ago. He presents today for follow-up. He is feeling better with pain management strategies as well as oxybutynin to prevent bladder spasms. Bothersome lower urinary tract symptoms are manageable. He is only had minimal hematuria. Denies fevers. Constipation has relieved. He's had kidney stone history in the past but never undergone ureteroscopy or lithotripsy. CT imaging in the emergency department did reveal several bilateral calculi and complex and simple-appearing cyst.   The problem is on the left side. He had eswl for treatment of his renal calculi. Patient denies stent, ureteroscopy, and percutaneous lithotomy. This procedure was done 11/26/2016. This is not his first kidney stone. He does have a stent in place.   He does have urgency. He does have frequency.     ALLERGIES: None   MEDICATIONS: Aspirin 81 mg tablet, chewable  Terazosin Hcl 2 mg capsule  Black Cherry  Esgic 50 mg-325 mg-40 mg capsule  Krill Oil  Loratadine 10 mg capsule  Multivitamin  Prilosec Otc 20 mg tablet, delayed release  Toprol Xl 50 mg tablet, extended release 24 hr  Tylenol 325 mg tablet  Zocor 40 mg tablet     GU PSH: Explore Kidney    NON-GU PSH: None   GU  PMH: None   NON-GU PMH: GERD Hypercholesterolemia Hypertension    FAMILY HISTORY: 1 Daughter - Daughter Death - Mother, Father Heart Attack - Mother, Father    Notes: mother passed @ age 76-cardiac arrest  father passed @ age 32-cardiac arrest    SOCIAL HISTORY: Marital Status: Married Current Smoking Status: Patient does not smoke anymore. Smoked for 20 years.   Tobacco Use Assessment Completed: Used Tobacco in last 30 days? Has never drank.  Drinks 1 caffeinated drink per day. Patient's occupation is/was retired.    REVIEW OF SYSTEMS:    GU Review Male:   Patient reports frequent urination, hard to postpone urination, get up at night to urinate, and leakage of urine. Patient denies burning/ pain with urination, stream starts and stops, trouble starting your stream, have to strain to urinate , erection problems, and penile pain.  Gastrointestinal (Upper):   Patient reports nausea and vomiting. Patient denies indigestion/ heartburn.  Gastrointestinal (Lower):   Patient reports constipation. Patient denies diarrhea.  Constitutional:   Patient denies fever, night sweats, weight loss, and fatigue.  Skin:   Patient denies skin rash/ lesion and itching.  Eyes:   Patient denies blurred vision and double vision.  Ears/ Nose/ Throat:   Patient denies sore throat and sinus problems.  Hematologic/Lymphatic:   Patient reports swollen glands and easy bruising.   Cardiovascular:   Patient denies leg swelling and chest pains.  Respiratory:   Patient denies cough and shortness of breath.  Endocrine:   Patient denies excessive thirst.  Musculoskeletal:   Patient reports back pain. Patient denies joint pain.  Neurological:   Patient reports headaches and dizziness.   Psychologic:   Patient denies depression and anxiety.   VITAL SIGNS:      12/04/2016 10:39 AM  Weight 205 lb / 92.99 kg  Height 70 in / 177.8 cm  BP 136/84 mmHg  Pulse 79 /min  Temperature 97.0 F / 36 C  BMI 29.4 kg/m    MULTI-SYSTEM PHYSICAL EXAMINATION:    Constitutional: Well-nourished. No physical deformities. Normally developed. Good grooming.  Respiratory: No labored breathing, no use of accessory muscles. CTA.  Cardiovascular: Normal temperature, normal extremity pulses, no swelling, no varicosities. RRR.  Skin: No paleness, no jaundice, no cyanosis. No lesion, no ulcer, no rash.  Neurologic / Psychiatric: Oriented to time, oriented to place, oriented to person. No depression, no anxiety, no agitation.   Gastrointestinal: No hernia. No mass, no tenderness, no rigidity, non obese abdomen. No palpable flank tenderness.  Musculoskeletal: Spine, ribs, pelvis no bilateral tenderness. Normal gait and station of head and neck.     PAST DATA REVIEWED:  Source Of History:  Patient  Records Review:   Previous Patient Records  Urine Test Review:   Urinalysis  X-Ray Review: C.T. Stone Protocol: Reviewed Films.     12/04/16  Urinalysis  Urine Appearance Cloudy   Urine Color Yellow   Urine Glucose Neg   Urine Bilirubin Neg   Urine Ketones Neg   Urine Specific Gravity 1.025   Urine Blood 3+   Urine pH 5.5   Urine Protein 2+   Urine Urobilinogen 0.2   Urine Nitrites Neg   Urine Leukocyte Esterase Trace   Urine WBC/hpf 6 - 10/hpf   Urine RBC/hpf >60/hpf   Urine Epithelial Cells 0 - 5/hpf   Urine Bacteria Few (10-25/hpf)   Urine Mucous Not Present   Urine Yeast NS (Not Seen)   Urine Trichomonas Not Present   Urine Cystals NS (Not Seen)   Urine Casts NS (Not Seen)   Urine Sperm Not Present    PROCEDURES:         KUB [id number]  A single view of the abdomen is obtained.  Ureteral Stent:  In position ureteral stent. Left ureteral stent.      There is a calculus identified along the proximal left ureter measuring around 6 mm. there are likely but poorly visualized smaller nonobstructing calculi seen bilaterally within the contours of the renal shadow. Bowel gas pattern is within normal limits but  overlying the right renal shadow. No other obvious ureteral calculi are seen. Bladder appears free of obstruction. No new obvious bony abnormalities. Stable pelvic phlebolith.         Urinalysis w/Scope Dipstick Dipstick Cont'd Micro  Color: Yellow Bilirubin: Neg WBC/hpf: 6 - 10/hpf  Appearance: Cloudy Ketones: Neg RBC/hpf: >60/hpf  Specific Gravity: 1.025 Blood: 3+ Bacteria: Few (10-25/hpf)  pH: 5.5 Protein: 2+ Cystals: NS (Not Seen)  Glucose: Neg Urobilinogen: 0.2 Casts: NS (Not Seen)    Nitrites: Neg Trichomonas: Not Present    Leukocyte Esterase: Trace Mucous: Not Present      Epithelial Cells: 0 - 5/hpf      Yeast: NS (Not Seen)      Sperm: Not Present    ASSESSMENT:      ICD-10 Details  1 GU:   Calculus Kidney and Ureter - N20.2    PLAN:           Orders Labs Urine Culture and Sensitivity          Schedule Return  Visit/Planned Activity: 1 Week - Schedule Surgery          Document Letter(s):  Created for Patient: Clinical Summary         Notes:   We did discuss the complications of shockwave lithotripsy including need for additional procedures, hematoma, obstructing fragments, and ongoing pain. He has pain medication on hand at home as well as OAB medications which do help control symptoms. I'll try to get him scheduled in the next week for ESWL and then f/u for reassessment and stent removal. F/u instructions given for worsening symptoms. UA sent for c/s. Patient expresses understanding at current plan of care. He understands to remain well hydrated on continue with bowel management strategies.     * Signed by [name] [name] on 12/04/16 at 11:11 AM (EST)*     The information contained in this medical record document is considered private and confidential patient information. This information can only be used for the medical diagnosis and/or medical services that are being provided by the patient's selected caregivers. This information can only be distributed outside of  the patient's care if the patient agrees and signs waivers of authorization for this information to be sent to an outside source or route.

## 2016-12-08 ENCOUNTER — Ambulatory Visit (HOSPITAL_COMMUNITY): Payer: PPO

## 2016-12-08 ENCOUNTER — Encounter (HOSPITAL_COMMUNITY)
Admission: RE | Disposition: A | Discharge status: Home / Self Care | Payer: Self-pay | Source: Ambulatory Visit | Attending: Urology

## 2016-12-08 ENCOUNTER — Encounter (HOSPITAL_COMMUNITY): Payer: Self-pay | Admitting: *Deleted

## 2016-12-08 ENCOUNTER — Ambulatory Visit (HOSPITAL_COMMUNITY)
Admission: RE | Admit: 2016-12-08 | Discharge: 2016-12-08 | Disposition: A | Discharge status: Home / Self Care | Payer: PPO | Source: Ambulatory Visit | Attending: Urology | Admitting: Urology

## 2016-12-08 DIAGNOSIS — N201 Calculus of ureter: Secondary | ICD-10-CM

## 2016-12-08 DIAGNOSIS — Z87891 Personal history of nicotine dependence: Secondary | ICD-10-CM | POA: Diagnosis not present

## 2016-12-08 DIAGNOSIS — E7800 Pure hypercholesterolemia, unspecified: Secondary | ICD-10-CM | POA: Insufficient documentation

## 2016-12-08 DIAGNOSIS — Z7982 Long term (current) use of aspirin: Secondary | ICD-10-CM | POA: Insufficient documentation

## 2016-12-08 DIAGNOSIS — N200 Calculus of kidney: Secondary | ICD-10-CM | POA: Diagnosis not present

## 2016-12-08 DIAGNOSIS — I10 Essential (primary) hypertension: Secondary | ICD-10-CM | POA: Diagnosis not present

## 2016-12-08 DIAGNOSIS — Z87442 Personal history of urinary calculi: Secondary | ICD-10-CM | POA: Insufficient documentation

## 2016-12-08 HISTORY — PX: EXTRACORPOREAL SHOCK WAVE LITHOTRIPSY: SHX1557

## 2016-12-08 HISTORY — DX: Headache: R51

## 2016-12-08 HISTORY — DX: Personal history of urinary calculi: Z87.442

## 2016-12-08 HISTORY — DX: Headache, unspecified: R51.9

## 2016-12-08 HISTORY — DX: Malignant (primary) neoplasm, unspecified: C80.1

## 2016-12-08 SURGERY — LITHOTRIPSY, ESWL
Anesthesia: LOCAL | Laterality: Left

## 2016-12-08 MED ORDER — SODIUM CHLORIDE 0.9 % IV SOLN
INTRAVENOUS | Status: DC
  Administered 2016-12-08: 08:00:00 via INTRAVENOUS

## 2016-12-08 MED ORDER — DIAZEPAM 5 MG PO TABS
10.0000 mg | ORAL_TABLET | ORAL | Status: AC
  Administered 2016-12-08: 10 mg via ORAL
  Filled 2016-12-08: qty 2

## 2016-12-08 MED ORDER — ASPIRIN 81 MG PO TBEC: 81.0000 mg | DELAYED_RELEASE_TABLET | Freq: Every morning | ORAL | 12 refills | Status: DC

## 2016-12-08 MED ORDER — CIPROFLOXACIN HCL 500 MG PO TABS
500.0000 mg | ORAL_TABLET | ORAL | Status: AC
  Administered 2016-12-08: 500 mg via ORAL
  Filled 2016-12-08: qty 1

## 2016-12-08 MED ORDER — DIPHENHYDRAMINE HCL 25 MG PO CAPS
25.0000 mg | ORAL_CAPSULE | ORAL | Status: AC
  Administered 2016-12-08: 25 mg via ORAL
  Filled 2016-12-08: qty 1

## 2016-12-08 NOTE — Op Note (Signed)
   Left ESWL   Left proximal 6 mm stone  Findings: stent in good position, good stone fragmentation, pt tolerated well

## 2016-12-08 NOTE — Interval H&P Note (Signed)
History and Physical Interval Note:  12/08/2016 9:19 AM  [name] [name]  has presented today for surgery, with the diagnosis of LEFT URETERAL STONE WITH INDWELLING STENT  The various methods of treatment have been discussed with the patient and family. After consideration of risks, benefits and other options for treatment, the patient has consented to  Procedure(s): LEFT EXTRACORPOREAL SHOCK WAVE LITHOTRIPSY (ESWL) (Left) as a surgical intervention .  The patient's history has been reviewed, patient examined, KUB reviewed, no change in status, stable for surgery.  I have reviewed the patient's chart and labs.  Questions were answered to the patient's satisfaction.     [name]

## 2016-12-08 NOTE — Discharge Instructions (Signed)
Lithotripsy, Care After °Refer to this sheet in the next few weeks. These instructions provide you with information on caring for yourself after your procedure. Your health care provider may also give you more specific instructions. Your treatment has been planned according to current medical practices, but problems sometimes occur. Call your health care provider if you have any problems or questions after your procedure. °WHAT TO EXPECT AFTER THE PROCEDURE  °· Your urine may have a red tinge for a few days after treatment. Blood loss is usually minimal. °· You may have soreness in the back or flank area. This usually goes away after a few days. The procedure can cause blotches or bruises on the back where the pressure wave enters the skin. These marks usually cause only minimal discomfort and should disappear in a short time. °· Stone fragments should begin to pass within 24 hours of treatment. However, a delayed passage is not unusual. °· You may have pain, discomfort, and feel sick to your stomach (nauseated) when the crushed fragments of stone are passed down the tube from the kidney to the bladder. Stone fragments can pass soon after the procedure and may last for up to 4-8 weeks. °· A small number of patients may have severe pain when stone fragments are not able to pass, which leads to an obstruction. °· If your stone is greater than 1 inch (2.5 cm) in diameter or if you have multiple stones that have a combined diameter greater than 1 inch (2.5 cm), you may require more than one treatment. °· If you had a stent placed prior to your procedure, you may experience some discomfort, especially during urination. You may experience the pain or discomfort in your flank or back, or you may experience a sharp pain or discomfort at the base of your penis or in your lower abdomen. The discomfort usually lasts only a few minutes after urinating. °HOME CARE INSTRUCTIONS  °· Rest at home until you feel your energy  improving. °· Only take over-the-counter or prescription medicines for pain, discomfort, or fever as directed by your health care provider. Depending on the type of lithotripsy, you may need to take antibiotics and anti-inflammatory medicines for a few days. °· Drink enough water and fluids to keep your urine clear or pale yellow. This helps "flush" your kidneys. It helps pass any remaining pieces of stone and prevents stones from coming back. °· Most people can resume daily activities within 1-2 days after standard lithotripsy. It can take longer to recover from laser and percutaneous lithotripsy. °· Strain all urine through the provided strainer. Keep all particulate matter and stones for your health care provider to see. The stone may be as small as a grain of salt. It is very important to use the strainer each and every time you pass your urine. Any stones that are found can be sent to a medical lab for examination. °· Visit your health care provider for a follow-up appointment in a few weeks. Your doctor may remove your stent if you have one. Your health care provider will also check to see whether stone particles still remain. °SEEK MEDICAL CARE IF:  °· Your pain is not relieved by medicine. °· You have a lasting nauseous feeling. °· You feel there is too much blood in the urine. °· You develop persistent problems with frequent or painful urination that does not at least partially improve after 2 days following the procedure. °· You have a congested cough. °· You feel   lightheaded.  You develop a rash or any other signs that might suggest an allergic problem.  You develop any reaction or side effects to your medicine(s). SEEK IMMEDIATE MEDICAL CARE IF:   You experience severe back or flank pain or both.  You see nothing but blood when you urinate.  You cannot pass any urine at all.  You have a fever or shaking chills.  You develop shortness of breath, difficulty breathing, or chest pain.  You  develop vomiting that will not stop after 6-8 hours.  You have a fainting episode. This information is not intended to replace advice given to you by your health care provider. Make sure you discuss any questions you have with your health care provider. Document Released: 10/26/2007 Document Revised: 06/27/2015 Document Reviewed: 04/21/2013 Elsevier Interactive Patient Education  2017 [employer].

## 2016-12-09 ENCOUNTER — Ambulatory Visit: Payer: PPO | Admitting: Family Medicine

## 2016-12-16 DIAGNOSIS — N202 Calculus of kidney with calculus of ureter: Secondary | ICD-10-CM | POA: Diagnosis not present

## 2016-12-24 NOTE — Addendum Note (Signed)
Addendum  created 12/24/16 2037 by Oleta Mouse, MD   Anesthesia Staff edited

## 2017-01-13 ENCOUNTER — Encounter (HOSPITAL_COMMUNITY): Payer: Self-pay | Admitting: Urology

## 2017-02-02 ENCOUNTER — Ambulatory Visit: Payer: PPO | Admitting: Family Medicine

## 2017-02-11 ENCOUNTER — Ambulatory Visit: Payer: PPO | Admitting: Family Medicine

## 2017-02-16 ENCOUNTER — Telehealth: Payer: Self-pay | Admitting: Family Medicine

## 2017-02-16 DIAGNOSIS — N202 Calculus of kidney with calculus of ureter: Secondary | ICD-10-CM | POA: Diagnosis not present

## 2017-02-16 NOTE — Telephone Encounter (Signed)
Patient aware to just do the labs tomorrrow

## 2017-02-16 NOTE — Telephone Encounter (Signed)
Patient called because he has an appt with Dr. Lina Sar.  He would like to know if it would be to late to do labs today?  appt is for 8:40am

## 2017-02-17 ENCOUNTER — Encounter: Payer: Self-pay | Admitting: Family Medicine

## 2017-02-17 ENCOUNTER — Ambulatory Visit (INDEPENDENT_AMBULATORY_CARE_PROVIDER_SITE_OTHER): Payer: PPO | Admitting: Family Medicine

## 2017-02-17 VITALS — BP 124/80 | HR 69 | Resp 16 | Ht 70.0 in | Wt 210.0 lb

## 2017-02-17 DIAGNOSIS — K219 Gastro-esophageal reflux disease without esophagitis: Secondary | ICD-10-CM

## 2017-02-17 DIAGNOSIS — E784 Other hyperlipidemia: Secondary | ICD-10-CM | POA: Diagnosis not present

## 2017-02-17 DIAGNOSIS — R51 Headache: Secondary | ICD-10-CM

## 2017-02-17 DIAGNOSIS — J302 Other seasonal allergic rhinitis: Secondary | ICD-10-CM | POA: Diagnosis not present

## 2017-02-17 DIAGNOSIS — I10 Essential (primary) hypertension: Secondary | ICD-10-CM | POA: Diagnosis not present

## 2017-02-17 DIAGNOSIS — E669 Obesity, unspecified: Secondary | ICD-10-CM | POA: Diagnosis not present

## 2017-02-17 DIAGNOSIS — R7302 Impaired glucose tolerance (oral): Secondary | ICD-10-CM

## 2017-02-17 DIAGNOSIS — E7849 Other hyperlipidemia: Secondary | ICD-10-CM

## 2017-02-17 DIAGNOSIS — N400 Enlarged prostate without lower urinary tract symptoms: Secondary | ICD-10-CM

## 2017-02-17 DIAGNOSIS — R519 Headache, unspecified: Secondary | ICD-10-CM

## 2017-02-17 DIAGNOSIS — E785 Hyperlipidemia, unspecified: Secondary | ICD-10-CM | POA: Diagnosis not present

## 2017-02-17 DIAGNOSIS — E66811 Obesity, class 1: Secondary | ICD-10-CM

## 2017-02-17 LAB — COMPREHENSIVE METABOLIC PANEL
ALT: 18 U/L (ref 9–46)
AST: 24 U/L (ref 10–35)
Albumin: 4.3 g/dL (ref 3.6–5.1)
Alkaline Phosphatase: 47 U/L (ref 40–115)
BUN: 16 mg/dL (ref 7–25)
CO2: 24 mmol/L (ref 20–31)
Calcium: 8.7 mg/dL (ref 8.6–10.3)
Chloride: 95 mmol/L — ABNORMAL LOW (ref 98–110)
Creat: 1.42 mg/dL — ABNORMAL HIGH (ref 0.70–1.18)
Glucose, Bld: 81 mg/dL (ref 65–99)
Potassium: 4.1 mmol/L (ref 3.5–5.3)
Sodium: 127 mmol/L — ABNORMAL LOW (ref 135–146)
Total Bilirubin: 0.6 mg/dL (ref 0.2–1.2)
Total Protein: 6.4 g/dL (ref 6.1–8.1)

## 2017-02-17 LAB — LIPID PANEL
Cholesterol: 177 mg/dL (ref <200)
HDL: 52 mg/dL (ref >40)
LDL Cholesterol: 102 mg/dL — ABNORMAL HIGH (ref <100)
Total CHOL/HDL Ratio: 3.4 Ratio (ref <5.0)
Triglycerides: 113 mg/dL (ref <150)
VLDL: 23 mg/dL (ref <30)

## 2017-02-17 LAB — CBC
HCT: 40.9 % (ref 38.5–50.0)
Hemoglobin: 13.2 g/dL (ref 13.2–17.1)
MCH: 28.2 pg (ref 27.0–33.0)
MCHC: 32.3 g/dL (ref 32.0–36.0)
MCV: 87.4 fL (ref 80.0–100.0)
MPV: 10.8 fL (ref 7.5–12.5)
Platelets: 194 10*3/uL (ref 140–400)
RBC: 4.68 MIL/uL (ref 4.20–5.80)
RDW: 15.4 % — ABNORMAL HIGH (ref 11.0–15.0)
WBC: 7.9 10*3/uL (ref 3.8–10.8)

## 2017-02-17 LAB — TSH: TSH: 3.37 mIU/L (ref 0.40–4.50)

## 2017-02-17 NOTE — Assessment & Plan Note (Signed)
Improved on metoprolol, has not needed to use fioricet , which is removed from med list

## 2017-02-17 NOTE — Assessment & Plan Note (Signed)
Denies urinary obstructive flow on terazosin

## 2017-02-17 NOTE — Assessment & Plan Note (Signed)
Mildly symptomatic, uses otc medication as needed

## 2017-02-17 NOTE — Assessment & Plan Note (Signed)
Deteriorated. Patient re-educated about  the importance of commitment to a  minimum of 150 minutes of exercise per week.  The importance of healthy food choices with portion control discussed. Encouraged to start a food diary, count calories and to consider  joining a support group. Sample diet sheets offered. Goals set by the patient for the next several months.   Weight /BMI 02/17/2017 12/08/2016 12/02/2016  WEIGHT 210 lb 201 lb 205 lb 6.4 oz  HEIGHT 5\' 10"  5\' 10"  5\' 10"   BMI 30.13 kg/m2 28.84 kg/m2 29.47 kg/m2

## 2017-02-17 NOTE — Assessment & Plan Note (Signed)
Patient educated about the importance of limiting  Carbohydrate intake , the need to commit to daily physical activity for a minimum of 30 minutes , and to commit weight loss.  Updated lab needed   Diabetic Labs Latest Ref Rng & Units 12/05/2016 11/28/2016 11/27/2016 11/26/2016 11/24/2016  HbA1c <5.7 % - - - - -  Chol <200 mg/dL - - - - -  HDL >40 mg/dL - - - - -  Calc LDL <100 mg/dL - - - - -  Triglycerides <150 mg/dL - - - - -  Creatinine 0.61 - 1.24 mg/dL 1.42(H) 1.85(H) 2.26(H) 2.23(H) 1.99(H)   BP/Weight 02/17/2017 12/08/2016 12/02/2016 11/28/2016 11/24/2016 10/09/2016 62/22/9798  Systolic BP 921 194 174 081 448 185 631  Diastolic BP 80 97 78 99 90 84 82  Wt. (Lbs) 210 201 205.4 - 210 210 209  BMI 30.13 28.84 29.47 - 30.13 31.01 29.99   No flowsheet data found.

## 2017-02-17 NOTE — Patient Instructions (Signed)
Annual Physical exam end November, call if you need me  Before  Excellent exam and will send the lab results  To you  Please work on good  health habits so that your health will improve. 1. Commitment to daily physical activity for 30 to 60  minutes, if you are able to do this.  2. Commitment to wise food choices. Aim for half of your  food intake to be vegetable and fruit, one quarter starchy foods, and one quarter protein. Try to eat on a regular schedule  3 meals per day, snacking between meals should be limited to vegetables or fruits or small portions of nuts. 64 ounces of water per day is generally recommended, unless you have specific health conditions, like heart failure or kidney failure where you will need to limit fluid intake.  3. Commitment to sufficient and a  good quality of physical and mental rest daily, generally between 6 to 8 hours per day.  WITH PERSISTANCE AND PERSEVERANCE, THE IMPOSSIBLE , BECOMES THE NORM! It is important that you exercise regularly at least 30 minutes 5 times a week. If you develop chest pain, have severe difficulty breathing, or feel very tired, stop exercising immediately and seek medical attention    Thank you  for choosing [facility]. We consider it a privelige to serve you.  Delivering excellent health care in a caring and  compassionate way is our goal.  Partnering with you,  so that together we can achieve this goal is our strategy.

## 2017-02-17 NOTE — Assessment & Plan Note (Signed)
Controlled, no change in medication DASH diet and commitment to daily physical activity for a minimum of 30 minutes discussed and encouraged, as a part of hypertension management. The importance of attaining a healthy weight is also discussed.  BP/Weight 02/17/2017 12/08/2016 12/02/2016 11/28/2016 11/24/2016 10/09/2016 67/89/3810  Systolic BP 175 102 585 277 824 235 361  Diastolic BP 80 97 78 99 90 84 82  Wt. (Lbs) 210 201 205.4 - 210 210 209  BMI 30.13 28.84 29.47 - 30.13 31.01 29.99

## 2017-02-17 NOTE — Assessment & Plan Note (Signed)
Hyperlipidemia:Low fat diet discussed and encouraged.   Lipid Panel  Lab Results  Component Value Date   CHOL 160 09/02/2016   HDL 50 09/02/2016   LDLCALC 92 09/02/2016   TRIG 90 09/02/2016   CHOLHDL 3.2 09/02/2016     Updated lab today will send result note to pt

## 2017-02-17 NOTE — Progress Notes (Signed)
[name] [name]     MRN: [id number]      DOB: 21-Oct-1940   HPI Mr. Spackman is here for follow up and re-evaluation of chronic medical conditions, medication management and review of any available recent lab and radiology data.  Preventive health is updated, specifically  Cancer screening and Immunization.   Saw urology yesterday and is doing well. The PT denies any adverse reactions to current medications since the last visit.  There are no new concerns.  There are no specific complaints   ROS Denies recent fever or chills. Denies sinus pressure, nasal congestion, ear pain or sore throat. Denies chest congestion, productive cough or wheezing. Denies chest pains, palpitations and leg swelling Denies abdominal pain, nausea, vomiting,diarrhea or constipation.   Denies dysuria, frequency, hesitancy or incontinence. Denies uncontrolled joint pain, swelling and limitation in mobility. Denies headaches, seizures, numbness, or tingling. Denies depression, anxiety or insomnia. Denies skin break down or rash.   PE  BP 124/80   Pulse 69   Resp 16   Ht 5\' 10"  (1.778 m)   Wt 210 lb (95.3 kg)   SpO2 94%   BMI 30.13 kg/m   Patient alert and oriented and in no cardiopulmonary distress.  HEENT: No facial asymmetry, EOMI,   oropharynx pink and moist.  Neck supple no JVD, no mass.  Chest: Clear to auscultation bilaterally.  CVS: S1, S2 no murmurs, no S3.Regular rate.  ABD: Soft non tender.   Ext: No edema  MS: Adequate ROM spine, shoulders, hips and knees.  Skin: Intact, no ulcerations or rash noted.  Psych: Good eye contact, normal affect. Memory intact not anxious or depressed appearing.  CNS: CN 2-12 intact, power,  normal throughout.no focal deficits noted.   Assessment & Plan  Essential hypertension Controlled, no change in medication DASH diet and commitment to daily physical activity for a minimum of 30 minutes discussed and encouraged, as a part of hypertension  management. The importance of attaining a healthy weight is also discussed.  BP/Weight 02/17/2017 12/08/2016 12/02/2016 11/28/2016 11/24/2016 10/09/2016 63/84/5364  Systolic BP 680 321 224 825 003 704 888  Diastolic BP 80 97 78 99 90 84 82  Wt. (Lbs) 210 201 205.4 - 210 210 209  BMI 30.13 28.84 29.47 - 30.13 31.01 29.99       Hyperlipidemia Hyperlipidemia:Low fat diet discussed and encouraged.   Lipid Panel  Lab Results  Component Value Date   CHOL 160 09/02/2016   HDL 50 09/02/2016   LDLCALC 92 09/02/2016   TRIG 90 09/02/2016   CHOLHDL 3.2 09/02/2016     Updated lab today will send result note to pt   Obesity (BMI 30.0-34.9) Deteriorated. Patient re-educated about  the importance of commitment to a  minimum of 150 minutes of exercise per week.  The importance of healthy food choices with portion control discussed. Encouraged to start a food diary, count calories and to consider  joining a support group. Sample diet sheets offered. Goals set by the patient for the next several months.   Weight /BMI 02/17/2017 12/08/2016 12/02/2016  WEIGHT 210 lb 201 lb 205 lb 6.4 oz  HEIGHT 5\' 10"  5\' 10"  5\' 10"   BMI 30.13 kg/m2 28.84 kg/m2 29.47 kg/m2      Frequent headaches Improved on metoprolol, has not needed to use fioricet , which is removed from med list  BPH (benign prostatic hyperplasia) Denies urinary obstructive flow on terazosin  IGT (impaired glucose tolerance) Patient educated about the importance of limiting  Carbohydrate intake , the need to commit to daily physical activity for a minimum of 30 minutes , and to commit weight loss.  Updated lab needed   Diabetic Labs Latest Ref Rng & Units 12/05/2016 11/28/2016 11/27/2016 11/26/2016 11/24/2016  HbA1c <5.7 % - - - - -  Chol <200 mg/dL - - - - -  HDL >40 mg/dL - - - - -  Calc LDL <100 mg/dL - - - - -  Triglycerides <150 mg/dL - - - - -  Creatinine 0.61 - 1.24 mg/dL 1.42(H) 1.85(H) 2.26(H) 2.23(H) 1.99(H)   BP/Weight 02/17/2017  12/08/2016 12/02/2016 11/28/2016 11/24/2016 10/09/2016 68/61/6837  Systolic BP 290 211 155 208 022 336 122  Diastolic BP 80 97 78 99 90 84 82  Wt. (Lbs) 210 201 205.4 - 210 210 209  BMI 30.13 28.84 29.47 - 30.13 31.01 29.99   No flowsheet data found.    GERD (gastroesophageal reflux disease) Asymptomatic off medication  Allergic rhinitis Mildly symptomatic, uses otc medication as needed

## 2017-02-17 NOTE — Assessment & Plan Note (Signed)
Asymptomatic off medication

## 2017-02-20 ENCOUNTER — Telehealth: Payer: Self-pay

## 2017-02-20 DIAGNOSIS — E871 Hypo-osmolality and hyponatremia: Secondary | ICD-10-CM | POA: Insufficient documentation

## 2017-02-20 HISTORY — DX: Hypo-osmolality and hyponatremia: E87.1

## 2017-02-20 NOTE — Telephone Encounter (Signed)
-----   Message from Fayrene Helper, MD sent at 02/19/2017  6:49 AM EDT ----- Your liver function and cholesterol are good, cut back on fatty food a little as "bad cholesterol" slightly elevated, should be UNDER 100 Limit water to 64 ounces daily , and increase saly intakea bit , your sodium level slow, needs to rept non fast chem 7 next Wednesday morning due to low sodium, which is new

## 2017-02-25 ENCOUNTER — Other Ambulatory Visit: Payer: Self-pay | Admitting: Family Medicine

## 2017-02-25 DIAGNOSIS — E871 Hypo-osmolality and hyponatremia: Secondary | ICD-10-CM | POA: Diagnosis not present

## 2017-02-25 LAB — BASIC METABOLIC PANEL
BUN: 15 mg/dL (ref 7–25)
CO2: 26 mmol/L (ref 20–31)
Calcium: 8.8 mg/dL (ref 8.6–10.3)
Chloride: 108 mmol/L (ref 98–110)
Creat: 1.33 mg/dL — ABNORMAL HIGH (ref 0.70–1.18)
Glucose, Bld: 81 mg/dL (ref 65–99)
Potassium: 4.1 mmol/L (ref 3.5–5.3)
Sodium: 142 mmol/L (ref 135–146)

## 2017-03-12 ENCOUNTER — Other Ambulatory Visit: Payer: Self-pay | Admitting: Family Medicine

## 2017-03-12 DIAGNOSIS — I10 Essential (primary) hypertension: Secondary | ICD-10-CM

## 2017-03-22 ENCOUNTER — Other Ambulatory Visit: Payer: Self-pay | Admitting: Family Medicine

## 2017-03-23 NOTE — Addendum Note (Signed)
Addendum  created 03/23/17 1112 by Oleta Mouse, MD   Sign clinical note

## 2017-04-20 DIAGNOSIS — Z85828 Personal history of other malignant neoplasm of skin: Secondary | ICD-10-CM | POA: Diagnosis not present

## 2017-04-20 DIAGNOSIS — L570 Actinic keratosis: Secondary | ICD-10-CM | POA: Diagnosis not present

## 2017-04-29 DIAGNOSIS — M9903 Segmental and somatic dysfunction of lumbar region: Secondary | ICD-10-CM | POA: Diagnosis not present

## 2017-04-29 DIAGNOSIS — M545 Low back pain: Secondary | ICD-10-CM | POA: Diagnosis not present

## 2017-04-29 DIAGNOSIS — M9905 Segmental and somatic dysfunction of pelvic region: Secondary | ICD-10-CM | POA: Diagnosis not present

## 2017-04-29 DIAGNOSIS — M9902 Segmental and somatic dysfunction of thoracic region: Secondary | ICD-10-CM | POA: Diagnosis not present

## 2017-05-01 DIAGNOSIS — M9903 Segmental and somatic dysfunction of lumbar region: Secondary | ICD-10-CM | POA: Diagnosis not present

## 2017-05-01 DIAGNOSIS — M9905 Segmental and somatic dysfunction of pelvic region: Secondary | ICD-10-CM | POA: Diagnosis not present

## 2017-05-01 DIAGNOSIS — M545 Low back pain: Secondary | ICD-10-CM | POA: Diagnosis not present

## 2017-05-01 DIAGNOSIS — M9902 Segmental and somatic dysfunction of thoracic region: Secondary | ICD-10-CM | POA: Diagnosis not present

## 2017-05-08 ENCOUNTER — Other Ambulatory Visit: Payer: Self-pay | Admitting: Family Medicine

## 2017-05-08 DIAGNOSIS — M545 Low back pain: Secondary | ICD-10-CM | POA: Diagnosis not present

## 2017-05-08 DIAGNOSIS — M9905 Segmental and somatic dysfunction of pelvic region: Secondary | ICD-10-CM | POA: Diagnosis not present

## 2017-05-08 DIAGNOSIS — M9902 Segmental and somatic dysfunction of thoracic region: Secondary | ICD-10-CM | POA: Diagnosis not present

## 2017-05-08 DIAGNOSIS — M9903 Segmental and somatic dysfunction of lumbar region: Secondary | ICD-10-CM | POA: Diagnosis not present

## 2017-06-24 ENCOUNTER — Ambulatory Visit (INDEPENDENT_AMBULATORY_CARE_PROVIDER_SITE_OTHER): Payer: PPO

## 2017-06-24 VITALS — BP 130/82 | HR 60 | Temp 98.0°F | Ht 70.0 in | Wt 206.0 lb

## 2017-06-24 DIAGNOSIS — Z0000 Encounter for general adult medical examination without abnormal findings: Secondary | ICD-10-CM

## 2017-06-24 NOTE — Patient Instructions (Addendum)
[name] [name] , Thank you for taking time to come for your Medicare Wellness Visit. I appreciate your ongoing commitment to your health goals. Please review the following plan we discussed and let me know if I can assist you in the future.   Screening recommendations/referrals: Colonoscopy: Up to date, next due 04/2022 Recommended yearly ophthalmology/optometry visit for glaucoma screening and checkup Recommended yearly dental visit for hygiene and checkup  Vaccinations: Influenza vaccine: Due, please discuss at your next OV Pneumococcal vaccine: Up to date Tdap vaccine: Up to date, next due 07/2021 Shingles vaccine: Done    Advanced directives: Please bring a copy of your POA (Power of [place]) and/or Living Will to your next appointment.   Conditions/risks identified: Obese, recommend starting a routine exercise program at least 3 days a week for 30-45 minutes at a time as tolerated.   Next appointment: Follow up with Dr. Moshe Cipro on 08/26/2017 at 8:00 am. Follow up in 1 year for your annual wellness visit.   Preventive Care 75 Years and Older, Male Preventive care refers to lifestyle choices and visits with your health care provider that can promote health and wellness. What does preventive care include?  A yearly physical exam. This is also called an annual well check.  Dental exams once or twice a year.  Routine eye exams. Ask your health care provider how often you should have your eyes checked.  Personal lifestyle choices, including:  Daily care of your teeth and gums.  Regular physical activity.  Eating a healthy diet.  Avoiding tobacco and drug use.  Limiting alcohol use.  Practicing safe sex.  Taking low doses of aspirin every day.  Taking vitamin and mineral supplements as recommended by your health care provider. What happens during an annual well check? The services and screenings done by your health care provider during your annual well check will depend on  your age, overall health, lifestyle risk factors, and family history of disease. Counseling  Your health care provider may ask you questions about your:  Alcohol use.  Tobacco use.  Drug use.  Emotional well-being.  Home and relationship well-being.  Sexual activity.  Eating habits.  History of falls.  Memory and ability to understand (cognition).  Work and work Statistician. Screening  You may have the following tests or measurements:  Height, weight, and BMI.  Blood pressure.  Lipid and cholesterol levels. These may be checked every 5 years, or more frequently if you are over 25 years old.  Skin check.  Lung cancer screening. You may have this screening every year starting at age 69 if you have a 30-pack-year history of smoking and currently smoke or have quit within the past 15 years.  Fecal occult blood test (FOBT) of the stool. You may have this test every year starting at age 82.  Flexible sigmoidoscopy or colonoscopy. You may have a sigmoidoscopy every 5 years or a colonoscopy every 10 years starting at age 65.  Prostate cancer screening. Recommendations will vary depending on your family history and other risks.  Hepatitis C blood test.  Hepatitis B blood test.  Sexually transmitted disease (STD) testing.  Diabetes screening. This is done by checking your blood sugar (glucose) after you have not eaten for a while (fasting). You may have this done every 1-3 years.  Abdominal aortic aneurysm (AAA) screening. You may need this if you are a current or former smoker.  Osteoporosis. You may be screened starting at age 31 if you are at high risk.  Talk with your health care provider about your test results, treatment options, and if necessary, the need for more tests. Vaccines  Your health care provider may recommend certain vaccines, such as:  Influenza vaccine. This is recommended every year.  Tetanus, diphtheria, and acellular pertussis (Tdap, Td) vaccine.  You may need a Td booster every 10 years.  Zoster vaccine. You may need this after age 83.  Pneumococcal 13-valent conjugate (PCV13) vaccine. One dose is recommended after age 42.  Pneumococcal polysaccharide (PPSV23) vaccine. One dose is recommended after age 66. Talk to your health care provider about which screenings and vaccines you need and how often you need them. This information is not intended to replace advice given to you by your health care provider. Make sure you discuss any questions you have with your health care provider. Document Released: 11/02/2015 Document Revised: 06/25/2016 Document Reviewed: 08/07/2015 Elsevier Interactive Patient Education  2017 [place] Prevention in the Home Falls can cause injuries. They can happen to people of all ages. There are many things you can do to make your home safe and to help prevent falls. What can I do on the outside of my home?  Regularly fix the edges of walkways and driveways and fix any cracks.  Remove anything that might make you trip as you walk through a door, such as a raised step or threshold.  Trim any bushes or trees on the path to your home.  Use bright outdoor lighting.  Clear any walking paths of anything that might make someone trip, such as rocks or tools.  Regularly check to see if handrails are loose or broken. Make sure that both sides of any steps have handrails.  Any raised decks and porches should have guardrails on the edges.  Have any leaves, snow, or ice cleared regularly.  Use sand or salt on walking paths during winter.  Clean up any spills in your garage right away. This includes oil or grease spills. What can I do in the bathroom?  Use night lights.  Install grab bars by the toilet and in the tub and shower. Do not use towel bars as grab bars.  Use non-skid mats or decals in the tub or shower.  If you need to sit down in the shower, use a plastic, non-slip stool.  Keep the  floor dry. Clean up any water that spills on the floor as soon as it happens.  Remove soap buildup in the tub or shower regularly.  Attach bath mats securely with double-sided non-slip rug tape.  Do not have throw rugs and other things on the floor that can make you trip. What can I do in the bedroom?  Use night lights.  Make sure that you have a light by your bed that is easy to reach.  Do not use any sheets or blankets that are too big for your bed. They should not hang down onto the floor.  Have a firm chair that has side arms. You can use this for support while you get dressed.  Do not have throw rugs and other things on the floor that can make you trip. What can I do in the kitchen?  Clean up any spills right away.  Avoid walking on wet floors.  Keep items that you use a lot in easy-to-reach places.  If you need to reach something above you, use a strong step stool that has a grab bar.  Keep electrical cords out of the way.  Do not use floor polish or wax that makes floors slippery. If you must use wax, use non-skid floor wax.  Do not have throw rugs and other things on the floor that can make you trip. What can I do with my stairs?  Do not leave any items on the stairs.  Make sure that there are handrails on both sides of the stairs and use them. Fix handrails that are broken or loose. Make sure that handrails are as long as the stairways.  Check any carpeting to make sure that it is firmly attached to the stairs. Fix any carpet that is loose or worn.  Avoid having throw rugs at the top or bottom of the stairs. If you do have throw rugs, attach them to the floor with carpet tape.  Make sure that you have a light switch at the top of the stairs and the bottom of the stairs. If you do not have them, ask someone to add them for you. What else can I do to help prevent falls?  Wear shoes that:  Do not have high heels.  Have rubber bottoms.  Are comfortable and fit  you well.  Are closed at the toe. Do not wear sandals.  If you use a stepladder:  Make sure that it is fully opened. Do not climb a closed stepladder.  Make sure that both sides of the stepladder are locked into place.  Ask someone to hold it for you, if possible.  Clearly mark and make sure that you can see:  Any grab bars or handrails.  First and last steps.  Where the edge of each step is.  Use tools that help you move around (mobility aids) if they are needed. These include:  Canes.  Walkers.  Scooters.  Crutches.  Turn on the lights when you go into a dark area. Replace any light bulbs as soon as they burn out.  Set up your furniture so you have a clear path. Avoid moving your furniture around.  If any of your floors are uneven, fix them.  If there are any pets around you, be aware of where they are.  Review your medicines with your doctor. Some medicines can make you feel dizzy. This can increase your chance of falling. Ask your doctor what other things that you can do to help prevent falls. This information is not intended to replace advice given to you by your health care provider. Make sure you discuss any questions you have with your health care provider. Document Released: 08/02/2009 Document Revised: 03/13/2016 Document Reviewed: 11/10/2014 Elsevier Interactive Patient Education  2017 [employer].

## 2017-06-24 NOTE — Progress Notes (Signed)
Subjective:   [name] [name] is a 76 y.o. male who presents for Medicare Annual/Subsequent preventive examination.  Review of Systems:  Cardiac Risk Factors include: dyslipidemia;hypertension;advanced age (>47men, >71 women);obesity (BMI >30kg/m2)     Objective:    Vitals: BP 130/82   Pulse 60   Temp 98 F (36.7 C) (Oral)   Ht 5\' 10"  (1.778 m)   Wt 206 lb (93.4 kg)   BMI 29.56 kg/m   Body mass index is 29.56 kg/m.  Tobacco History  Smoking Status  . Former Smoker  . Packs/day: 2.00  . Years: 15.00  . Types: Cigarettes  . Quit date: 10/20/1978  Smokeless Tobacco  . Former Systems developer  . Types: Chew  . Quit date: 10/20/2004     Counseling given: Not Answered   Past Medical History:  Diagnosis Date  . Anemia, iron deficiency   . Cancer ([place])    skin cancers   . GERD (gastroesophageal reflux disease)   . H/O hiatal hernia   . Headache   . Hemorrhoids    and minimal polp  . History of kidney stones   . History of migraines childhood    Flared up in 2009, and have responded to depakote which was stopped in 9 months  . Hx of colonic polyps 02/24/2007  . Hyperlipidemia 2003  . Hypertension 2003  . Lung nodules since 2009   CT scan advised in 08/2010  . Neoplasm    dermal  . Overweight(278.02)    Past Surgical History:  Procedure Laterality Date  . COLONOSCOPY  2008   Fields-mod IH, 43mm polyp sigmoid colon (disappeared w/ insufflation & could not be retrieved)  . COLONOSCOPY  05/19/2012   Procedure: COLONOSCOPY;  Surgeon: Danie Binder, MD;  Location: [facility];  Service: Endoscopy;  Laterality: N/A;  8:30  . CYSTOSCOPY W/ URETERAL STENT PLACEMENT Left 11/27/2016   Procedure: CYSTOSCOPY WITH RETROGRADE PYELOGRAM/URETERAL STENT PLACEMENT;  Surgeon: Bjorn Loser, MD;  Location: [facility];  Service: Urology;  Laterality: Left;  . ESOPHAGOGASTRODUODENOSCOPY  01/05/2007   Fields-sm HH, benign SB biopsy  . EXTRACORPOREAL SHOCK WAVE LITHOTRIPSY Left 12/08/2016   Procedure: LEFT EXTRACORPOREAL SHOCK WAVE LITHOTRIPSY (ESWL);  Surgeon: Festus Aloe, MD;  Location: [facility];  Service: Urology;  Laterality: Left;  . GIVENS CAPSULE STUDY  01/20/2007   Fields-nothing to explain IDA, lymphectasias  . removal of skin cancer twice     Family History  Problem Relation Age of Onset  . Heart defect Mother        pacemaker   . Alzheimer's disease Mother 73       2011  . Heart defect Brother        bradycardia   . Heart failure Father   . Asthma Father   . Hypertension Daughter   . Kidney Stones Daughter   . Colon cancer Neg Hx    History  Sexual Activity  . Sexual activity: Yes    Outpatient Encounter Prescriptions as of 06/24/2017  Medication Sig  . acetaminophen (TYLENOL) 325 MG tablet Take 325 mg by mouth as needed for mild pain.  Marland Kitchen aspirin (ASPIRIN LOW DOSE) 81 MG EC tablet Take 1 tablet (81 mg total) by mouth every morning.  Javier Docker Oil 300 MG CAPS Take 1 capsule by mouth daily.  Marland Kitchen loratadine (CLARITIN) 10 MG tablet Take 10 mg by mouth daily.  . metoprolol succinate (TOPROL-XL) 50 MG 24 hr tablet Take 1 tablet (50 mg total) by mouth daily. Take  with or immediately following a meal.  . Misc Natural Products (BLACK CHERRY CONCENTRATE PO) Take 2 tablets by mouth daily.   . Multiple Vitamin (MULTIVITAMIN WITH MINERALS) TABS Take 1 tablet by mouth daily.  . simvastatin (ZOCOR) 40 MG tablet TAKE ONE TABLET BY MOUTH AT BEDTIME  . terazosin (HYTRIN) 2 MG capsule TAKE ONE CAPSULE BY MOUTH AT BEDTIME  . [DISCONTINUED] metoprolol succinate (TOPROL-XL) 50 MG 24 hr tablet TAKE ONE TABLET BY MOUTH ONCE DAILY.  TAKE WITH OR IMMEDIATELY FOLLOWING A MEAL  . [DISCONTINUED] senna-docusate (SENOKOT-S) 8.6-50 MG tablet Take 1 tablet by mouth daily. while taking opioid pain medications   No facility-administered encounter medications on file as of 06/24/2017.     Activities of Daily Living In your present state of health, do you have any difficulty performing the  following activities: 06/24/2017 12/08/2016  Hearing? N N  Vision? Y N  Comment patient will schedule a follow up appointment at his eye Dr. as soon as he is able -  Difficulty concentrating or making decisions? N N  Walking or climbing stairs? N N  Dressing or bathing? N N  Doing errands, shopping? N -  Preparing Food and eating ? N -  Using the Toilet? N -  In the past six months, have you accidently leaked urine? Y -  Comment occasional leakage -  Do you have problems with loss of bowel control? N -  Managing your Medications? N -  Managing your Finances? N -  Housekeeping or managing your Housekeeping? N -  Some recent data might be hidden    Patient Care Team: Fayrene Helper, MD as PCP - [facility], Marga Melnick, MD (Gastroenterology) Sandford Craze, MD as Referring Physician (Dermatology)   Assessment:    Exercise Activities and Dietary recommendations Current Exercise Habits: The patient has a physically strenous job, but has no regular exercise apart from work., Exercise limited by: None identified  Goals    . Exercise 3x per week (30 min per time)          Recommend starting a routine exercise program at least 3 days a week for 30-45 minutes at a time as tolerated.        Fall Risk Fall Risk  06/24/2017 10/09/2016 08/18/2016 10/08/2015 09/27/2015  Falls in the past year? No No No No No   Depression Screen PHQ 2/9 Scores 06/24/2017 10/09/2016 08/18/2016 05/01/2014  PHQ - 2 Score 0 0 0 2  PHQ- 9 Score - - - 7    Cognitive Function: Normal   6CIT Screen 06/24/2017 10/10/2016  What Year? 0 points 0 points  What month? 0 points 0 points  What time? 0 points 0 points  Count back from 20 0 points 0 points  Months in reverse 0 points 0 points  Repeat phrase 0 points 0 points  Total Score 0 0    Immunization History  Administered Date(s) Administered  . Influenza Whole 08/12/2010, 08/11/2011  . Influenza,inj,Quad PF,6+ Mos 07/27/2013, 07/27/2014, 08/07/2015,  09/01/2016  . Pneumococcal Conjugate-13 05/01/2014  . Pneumococcal Polysaccharide-23 04/29/2013  . Tdap 08/11/2011  . Zoster 04/10/2014   Screening Tests Health Maintenance  Topic Date Due  . INFLUENZA VACCINE  05/20/2017  . TETANUS/TDAP  08/10/2021  . COLONOSCOPY  05/19/2022  . PNA vac Low Risk Adult  Completed      Plan:   I have personally reviewed and noted the following in the patient's chart:   . Medical and social history . Use  of alcohol, tobacco or illicit drugs  . Current medications and supplements . Functional ability and status . Nutritional status . Physical activity . Advanced directives . List of other physicians . Hospitalizations, surgeries, and ER visits in previous 12 months . Vitals . Screenings to include cognitive, depression, and falls . Referrals and appointments  In addition, I have reviewed and discussed with patient certain preventive protocols, quality metrics, and best practice recommendations. A written personalized care plan for preventive services as well as general preventive health recommendations were provided to patient.     Stormy Fabian, LPN  05/21/9561

## 2017-08-07 ENCOUNTER — Ambulatory Visit: Payer: Self-pay

## 2017-08-26 ENCOUNTER — Encounter: Payer: Self-pay | Admitting: Family Medicine

## 2017-08-26 ENCOUNTER — Ambulatory Visit: Payer: PPO

## 2017-08-26 ENCOUNTER — Ambulatory Visit (INDEPENDENT_AMBULATORY_CARE_PROVIDER_SITE_OTHER): Payer: PPO | Admitting: Family Medicine

## 2017-08-26 VITALS — BP 140/80 | HR 82 | Resp 16 | Ht 70.0 in | Wt 209.0 lb

## 2017-08-26 DIAGNOSIS — I10 Essential (primary) hypertension: Secondary | ICD-10-CM | POA: Diagnosis not present

## 2017-08-26 DIAGNOSIS — Z125 Encounter for screening for malignant neoplasm of prostate: Secondary | ICD-10-CM

## 2017-08-26 DIAGNOSIS — Z1211 Encounter for screening for malignant neoplasm of colon: Secondary | ICD-10-CM | POA: Diagnosis not present

## 2017-08-26 DIAGNOSIS — Z23 Encounter for immunization: Secondary | ICD-10-CM | POA: Diagnosis not present

## 2017-08-26 DIAGNOSIS — Z0000 Encounter for general adult medical examination without abnormal findings: Secondary | ICD-10-CM

## 2017-08-26 LAB — COMPLETE METABOLIC PANEL WITH GFR
AG Ratio: 1.9 (calc) (ref 1.0–2.5)
ALT: 21 U/L (ref 9–46)
AST: 25 U/L (ref 10–35)
Albumin: 4.4 g/dL (ref 3.6–5.1)
Alkaline phosphatase (APISO): 52 U/L (ref 40–115)
BUN/Creatinine Ratio: 13 (calc) (ref 6–22)
BUN: 19 mg/dL (ref 7–25)
CO2: 29 mmol/L (ref 20–32)
Calcium: 9.4 mg/dL (ref 8.6–10.3)
Chloride: 105 mmol/L (ref 98–110)
Creat: 1.44 mg/dL — ABNORMAL HIGH (ref 0.70–1.18)
GFR, Est African American: 55 mL/min/{1.73_m2} — ABNORMAL LOW (ref >=60)
GFR, Est Non African American: 47 mL/min/{1.73_m2} — ABNORMAL LOW (ref >=60)
Globulin: 2.3 g/dL (calc) (ref 1.9–3.7)
Glucose, Bld: 84 mg/dL (ref 65–139)
Potassium: 4.4 mmol/L (ref 3.5–5.3)
Sodium: 141 mmol/L (ref 135–146)
Total Bilirubin: 0.5 mg/dL (ref 0.2–1.2)
Total Protein: 6.7 g/dL (ref 6.1–8.1)

## 2017-08-26 LAB — PSA: PSA: 0.9 ng/mL (ref <=4.0)

## 2017-08-26 LAB — POC HEMOCCULT BLD/STL (OFFICE/1-CARD/DIAGNOSTIC): Fecal Occult Blood, POC: NEGATIVE

## 2017-08-26 MED ORDER — TERAZOSIN HCL 1 MG PO CAPS: 1.0000 mg | ORAL_CAPSULE | Freq: Two times a day (BID) | ORAL | 3 refills | Status: DC

## 2017-08-26 NOTE — Patient Instructions (Signed)
Nurse blood pressure check in 2nd week in in December  MD f/u in 6 months  Labs today cmp and eGFr, pSA  Blood pressure is high so increase hytrin to one capsule two times daily 12 hours apart,, 10 am and 10 pm oR take two at 10 pm,   It is important that you exercise regularly at least 30 minutes 5 times a week. If you develop chest pain, have severe difficulty breathing, or feel very tired, stop exercising immediately and seek medical attention    Thank you  for choosing [facility]. We consider it a privelige to serve you.  Delivering excellent health care in a caring and  compassionate way is our goal.  Partnering with you,  so that together we can achieve this goal is our strategy.

## 2017-08-30 ENCOUNTER — Encounter: Payer: Self-pay | Admitting: Family Medicine

## 2017-08-30 NOTE — Assessment & Plan Note (Signed)
Uncontrolled and noit at goal Increase medication dose and review in 5 to 6 weeks  DASH diet and commitment to daily physical activity for a minimum of 30 minutes discussed and encouraged, as a part of hypertension management. The importance of attaining a healthy weight is also discussed.  BP/Weight 08/26/2017 06/24/2017 02/17/2017 12/08/2016 12/02/2016 04/27/2955 11/20/3084  Systolic BP 578 469 629 528 413 244 010  Diastolic BP 80 82 80 97 78 99 90  Wt. (Lbs) 209 206 210 201 205.4 - 210  BMI 29.99 29.56 30.13 28.84 29.47 - 30.13

## 2017-08-30 NOTE — Progress Notes (Signed)
   [name] [name]     MRN: [id number]      DOB: [birth year]    HPI: Patient is in for annual physical exam. Labs are drawn o day of visit and will be reviewed the next day Immunization is reviewed , and  updated  Blood pressure is e;levated and this is not new, medication adjustment made, f/u in 5 to 6 weeks heart healthy dies and exercise discussed and encoiuraged    PE; BP 140/80   Pulse 82   Resp 16   Ht 5\' 10"  (1.778 m)   Wt 209 lb (94.8 kg)   SpO2 94%   BMI 29.99 kg/m  Pleasant male, alert and oriented x 3, in no cardio-pulmonary distress. Afebrile. HEENT No facial trauma or asymetry. Sinuses non tender. EOMI, External ears normal, tympanic membranes clear. Oropharynx moist, no exudate. Neck: supple, no adenopathy,JVD or thyromegaly.No bruits.  Chest: Clear to ascultation bilaterally.No crackles or wheezes. Non tender to palpation  Breast: No asymetry,no masses. No nipple discharge or inversion. No axillary or supraclavicular adenopathy  Cardiovascular system; Heart sounds normal,  S1 and  S2 ,no S3.  No murmur, or thrill. Apical beat not displaced Peripheral pulses normal.  Abdomen: Soft, non tender, no organomegaly or masses. No bruits. Bowel sounds normal. No guarding, tenderness or rebound.  Rectal:  Normal sphincter tone. No hemorrhoids or  masses. guaiac negative stool. Prostate smooth and firm    Musculoskeletal exam: Full ROM of spine, hips , shoulders and knees. No deformity ,swelling or crepitus noted. No muscle wasting or atrophy.   Neurologic: Cranial nerves 2 to 12 intact. Power, tone ,sensation and reflexes normal throughout. No disturbance in gait. No tremor.  Skin: Intact, no ulceration, erythema , scaling or rash noted. Pigmentation normal throughout  Psych; Normal mood and affect. Judgement and concentration normal   Assessment & Plan:  Annual physical exam Annual exam as documented. Counseling done  re healthy  lifestyle involving commitment to 150 minutes exercise per week, heart healthy diet, and attaining healthy weight.The importance of adequate sleep also discussed. Regular seat belt use and home safety, is also discussed. Changes in health habits are decided on by the patient with goals and time frames  set for achieving them. Immunization and cancer screening needs are specifically addressed at this visit.   Essential hypertension Uncontrolled and noit at goal Increase medication dose and review in 5 to 6 weeks  DASH diet and commitment to daily physical activity for a minimum of 30 minutes discussed and encouraged, as a part of hypertension management. The importance of attaining a healthy weight is also discussed.  BP/Weight 08/26/2017 06/24/2017 02/17/2017 12/08/2016 12/02/2016 03/20/4430 02/20/85  Systolic BP 761 950 932 671 245 809 983  Diastolic BP 80 82 80 97 78 99 90  Wt. (Lbs) 209 206 210 201 205.4 - 210  BMI 29.99 29.56 30.13 28.84 29.47 - 30.13

## 2017-08-30 NOTE — Assessment & Plan Note (Signed)

## 2017-09-15 ENCOUNTER — Other Ambulatory Visit: Payer: Self-pay | Admitting: Family Medicine

## 2017-09-15 DIAGNOSIS — I10 Essential (primary) hypertension: Secondary | ICD-10-CM

## 2017-09-17 ENCOUNTER — Other Ambulatory Visit: Payer: Self-pay | Admitting: Family Medicine

## 2017-09-17 DIAGNOSIS — M25522 Pain in left elbow: Secondary | ICD-10-CM | POA: Diagnosis not present

## 2017-09-19 DIAGNOSIS — M25522 Pain in left elbow: Secondary | ICD-10-CM | POA: Diagnosis not present

## 2017-09-21 DIAGNOSIS — M25522 Pain in left elbow: Secondary | ICD-10-CM | POA: Diagnosis not present

## 2017-09-24 ENCOUNTER — Ambulatory Visit: Payer: PPO

## 2017-09-24 VITALS — BP 132/80

## 2017-09-24 DIAGNOSIS — I10 Essential (primary) hypertension: Secondary | ICD-10-CM

## 2017-09-24 NOTE — Progress Notes (Signed)
BP improved. Continue same medication.

## 2017-09-30 DIAGNOSIS — S46212A Strain of muscle, fascia and tendon of other parts of biceps, left arm, initial encounter: Secondary | ICD-10-CM | POA: Diagnosis not present

## 2017-09-30 DIAGNOSIS — G8918 Other acute postprocedural pain: Secondary | ICD-10-CM | POA: Diagnosis not present

## 2017-09-30 DIAGNOSIS — Y999 Unspecified external cause status: Secondary | ICD-10-CM | POA: Diagnosis not present

## 2017-09-30 DIAGNOSIS — M66829 Spontaneous rupture of other tendons, unspecified upper arm: Secondary | ICD-10-CM | POA: Diagnosis not present

## 2017-09-30 DIAGNOSIS — X58XXXA Exposure to other specified factors, initial encounter: Secondary | ICD-10-CM | POA: Diagnosis not present

## 2017-10-12 DIAGNOSIS — M66829 Spontaneous rupture of other tendons, unspecified upper arm: Secondary | ICD-10-CM | POA: Diagnosis not present

## 2017-10-22 DIAGNOSIS — Z85828 Personal history of other malignant neoplasm of skin: Secondary | ICD-10-CM | POA: Diagnosis not present

## 2017-10-22 DIAGNOSIS — L570 Actinic keratosis: Secondary | ICD-10-CM | POA: Diagnosis not present

## 2017-11-04 ENCOUNTER — Other Ambulatory Visit: Payer: Self-pay | Admitting: Family Medicine

## 2017-11-04 DIAGNOSIS — S46292D Other injury of muscle, fascia and tendon of other parts of biceps, left arm, subsequent encounter: Secondary | ICD-10-CM | POA: Diagnosis not present

## 2017-11-06 DIAGNOSIS — S46292D Other injury of muscle, fascia and tendon of other parts of biceps, left arm, subsequent encounter: Secondary | ICD-10-CM | POA: Diagnosis not present

## 2017-11-10 DIAGNOSIS — S46292D Other injury of muscle, fascia and tendon of other parts of biceps, left arm, subsequent encounter: Secondary | ICD-10-CM | POA: Diagnosis not present

## 2017-11-13 DIAGNOSIS — M66822 Spontaneous rupture of other tendons, left upper arm: Secondary | ICD-10-CM | POA: Diagnosis not present

## 2017-11-13 DIAGNOSIS — S46292D Other injury of muscle, fascia and tendon of other parts of biceps, left arm, subsequent encounter: Secondary | ICD-10-CM | POA: Diagnosis not present

## 2017-11-17 DIAGNOSIS — S46292D Other injury of muscle, fascia and tendon of other parts of biceps, left arm, subsequent encounter: Secondary | ICD-10-CM | POA: Diagnosis not present

## 2017-11-24 DIAGNOSIS — S46292D Other injury of muscle, fascia and tendon of other parts of biceps, left arm, subsequent encounter: Secondary | ICD-10-CM | POA: Diagnosis not present

## 2017-11-26 DIAGNOSIS — S46292D Other injury of muscle, fascia and tendon of other parts of biceps, left arm, subsequent encounter: Secondary | ICD-10-CM | POA: Diagnosis not present

## 2017-12-01 DIAGNOSIS — S46292D Other injury of muscle, fascia and tendon of other parts of biceps, left arm, subsequent encounter: Secondary | ICD-10-CM | POA: Diagnosis not present

## 2017-12-04 DIAGNOSIS — S46292D Other injury of muscle, fascia and tendon of other parts of biceps, left arm, subsequent encounter: Secondary | ICD-10-CM | POA: Diagnosis not present

## 2017-12-07 DIAGNOSIS — S46292D Other injury of muscle, fascia and tendon of other parts of biceps, left arm, subsequent encounter: Secondary | ICD-10-CM | POA: Diagnosis not present

## 2017-12-09 DIAGNOSIS — S46292D Other injury of muscle, fascia and tendon of other parts of biceps, left arm, subsequent encounter: Secondary | ICD-10-CM | POA: Diagnosis not present

## 2017-12-11 DIAGNOSIS — M66829 Spontaneous rupture of other tendons, unspecified upper arm: Secondary | ICD-10-CM | POA: Diagnosis not present

## 2017-12-11 DIAGNOSIS — M25522 Pain in left elbow: Secondary | ICD-10-CM | POA: Diagnosis not present

## 2017-12-15 DIAGNOSIS — S46292D Other injury of muscle, fascia and tendon of other parts of biceps, left arm, subsequent encounter: Secondary | ICD-10-CM | POA: Diagnosis not present

## 2017-12-17 DIAGNOSIS — S46292D Other injury of muscle, fascia and tendon of other parts of biceps, left arm, subsequent encounter: Secondary | ICD-10-CM | POA: Diagnosis not present

## 2017-12-22 DIAGNOSIS — S46292D Other injury of muscle, fascia and tendon of other parts of biceps, left arm, subsequent encounter: Secondary | ICD-10-CM | POA: Diagnosis not present

## 2017-12-24 DIAGNOSIS — S46292D Other injury of muscle, fascia and tendon of other parts of biceps, left arm, subsequent encounter: Secondary | ICD-10-CM | POA: Diagnosis not present

## 2017-12-29 DIAGNOSIS — S46292D Other injury of muscle, fascia and tendon of other parts of biceps, left arm, subsequent encounter: Secondary | ICD-10-CM | POA: Diagnosis not present

## 2018-01-06 DIAGNOSIS — S46292D Other injury of muscle, fascia and tendon of other parts of biceps, left arm, subsequent encounter: Secondary | ICD-10-CM | POA: Diagnosis not present

## 2018-01-06 DIAGNOSIS — H2513 Age-related nuclear cataract, bilateral: Secondary | ICD-10-CM | POA: Diagnosis not present

## 2018-01-06 DIAGNOSIS — H5203 Hypermetropia, bilateral: Secondary | ICD-10-CM | POA: Diagnosis not present

## 2018-01-06 DIAGNOSIS — H524 Presbyopia: Secondary | ICD-10-CM | POA: Diagnosis not present

## 2018-01-06 DIAGNOSIS — Z961 Presence of intraocular lens: Secondary | ICD-10-CM | POA: Diagnosis not present

## 2018-01-06 DIAGNOSIS — H52223 Regular astigmatism, bilateral: Secondary | ICD-10-CM | POA: Diagnosis not present

## 2018-01-08 DIAGNOSIS — M25522 Pain in left elbow: Secondary | ICD-10-CM | POA: Diagnosis not present

## 2018-01-08 DIAGNOSIS — S46292D Other injury of muscle, fascia and tendon of other parts of biceps, left arm, subsequent encounter: Secondary | ICD-10-CM | POA: Diagnosis not present

## 2018-03-20 ENCOUNTER — Other Ambulatory Visit: Payer: Self-pay | Admitting: Family Medicine

## 2018-03-24 ENCOUNTER — Other Ambulatory Visit: Payer: Self-pay | Admitting: Family Medicine

## 2018-03-24 DIAGNOSIS — I10 Essential (primary) hypertension: Secondary | ICD-10-CM

## 2018-04-26 DIAGNOSIS — L570 Actinic keratosis: Secondary | ICD-10-CM | POA: Diagnosis not present

## 2018-04-26 DIAGNOSIS — Z85828 Personal history of other malignant neoplasm of skin: Secondary | ICD-10-CM | POA: Diagnosis not present

## 2018-05-03 ENCOUNTER — Other Ambulatory Visit: Payer: Self-pay | Admitting: Family Medicine

## 2018-06-14 DIAGNOSIS — M19072 Primary osteoarthritis, left ankle and foot: Secondary | ICD-10-CM | POA: Diagnosis not present

## 2018-07-28 ENCOUNTER — Ambulatory Visit (INDEPENDENT_AMBULATORY_CARE_PROVIDER_SITE_OTHER): Payer: PPO

## 2018-07-28 DIAGNOSIS — Z23 Encounter for immunization: Secondary | ICD-10-CM

## 2018-08-19 ENCOUNTER — Other Ambulatory Visit: Payer: Self-pay | Admitting: Family Medicine

## 2018-09-13 ENCOUNTER — Ambulatory Visit (INDEPENDENT_AMBULATORY_CARE_PROVIDER_SITE_OTHER): Payer: PPO

## 2018-09-13 VITALS — BP 150/88 | HR 58 | Resp 12 | Ht 70.0 in | Wt 211.0 lb

## 2018-09-13 DIAGNOSIS — Z0000 Encounter for general adult medical examination without abnormal findings: Secondary | ICD-10-CM

## 2018-09-13 NOTE — Progress Notes (Signed)
Subjective:   [name] [name] is a 77 y.o. male who presents for Medicare Annual/Subsequent preventive examination.  Review of Systems:   Cardiac Risk Factors include: advanced age (>33men, >79 women);hypertension;male gender;smoking/ tobacco exposure     Objective:    Vitals: BP (!) 150/88   Pulse (!) 58   Resp 12   Ht 5\' 10"  (1.778 m)   Wt 211 lb (95.7 kg)   SpO2 96%   BMI 30.28 kg/m   Body mass index is 30.28 kg/m.  Advanced Directives 09/13/2018 06/24/2017 12/08/2016 12/05/2016 11/27/2016 11/26/2016 11/24/2016  Does Patient Have a Medical Advance Directive? No Yes Yes Yes Yes Yes Yes  Type of Advance Directive - [place];Living will Living will Living will Living will Living will Living will  Does patient want to make changes to medical advance directive? - - No - Patient declined No - Patient declined No - Patient declined - -  Copy of [place] in Chart? - No - copy requested No - copy requested No - copy requested - - -  Would patient like information on creating a medical advance directive? Yes (ED - Information included in AVS) - No - Patient declined No - Patient declined - - -  Pre-existing out of facility DNR order (yellow form or pink MOST form) - - - - - - -    Tobacco Social History   Tobacco Use  Smoking Status Former Smoker  . Packs/day: 2.00  . Years: 15.00  . Pack years: 30.00  . Types: Cigarettes  . Last attempt to quit: 10/20/1978  . Years since quitting: 39.9  Smokeless Tobacco Former Systems developer  . Types: Chew  . Quit date: 10/20/2004     Counseling given: Not Answered   Clinical Intake:  Pre-visit preparation completed: Yes  Pain : No/denies pain Pain Score: 0-No pain     BMI - recorded: 30.3 Nutritional Status: BMI > 30  Obese Nutritional Risks: None Diabetes: No  How often do you need to have someone help you when you read instructions, pamphlets, or other written materials from your doctor or pharmacy?: 2 -  Rarely What is the last grade level you completed in school?: 10th grade   Interpreter Needed?: No  Information entered by :: Francena Hanly LPN   Past Medical History:  Diagnosis Date  . Anemia, iron deficiency   . Cancer ([place])    skin cancers   . GERD (gastroesophageal reflux disease)   . H/O hiatal hernia   . Headache   . Hemorrhoids    and minimal polp  . History of kidney stones   . History of migraines childhood    Flared up in 2009, and have responded to depakote which was stopped in 9 months  . Hx of colonic polyps 02/24/2007  . Hyperlipidemia 2003  . Hypertension 2003  . Lung nodules since 2009   CT scan advised in 08/2010  . Neoplasm    dermal  . Overweight(278.02)    Past Surgical History:  Procedure Laterality Date  . COLONOSCOPY  2008   [facility]-mod IH, 69mm polyp sigmoid colon (disappeared w/ insufflation & could not be retrieved)  . COLONOSCOPY  05/19/2012   Procedure: COLONOSCOPY;  Surgeon: Danie Binder, MD;  Location[facility];  Service: Endoscopy;  Laterality: N/A;  8:30  . CYSTOSCOPY W/ URETERAL STENT PLACEMENT Left 11/27/2016   Procedure: CYSTOSCOPY WITH RETROGRADE PYELOGRAM/URETERAL STENT PLACEMENT;  Surgeon: Bjorn Loser, MD;  Location: [facility];  Service: Urology;  Laterality: Left;  . ESOPHAGOGASTRODUODENOSCOPY  01/05/2007   [facility]-sm HH, benign SB biopsy  . EXTRACORPOREAL SHOCK WAVE LITHOTRIPSY Left 12/08/2016   Procedure: LEFT EXTRACORPOREAL SHOCK WAVE LITHOTRIPSY (ESWL);  Surgeon: Festus Aloe, MD;  Location: [facility];  Service: Urology;  Laterality: Left;  . GIVENS CAPSULE STUDY  01/20/2007   Fields-nothing to explain IDA, lymphectasias  . removal of skin cancer twice     Family History  Problem Relation Age of Onset  . Heart defect Mother        pacemaker   . Alzheimer's disease Mother 49       2011  . Heart defect Brother        bradycardia   . Heart failure Father   . Asthma Father   . Hypertension Daughter   . Kidney Stones Daughter    . Colon cancer Neg Hx    Social History   Socioeconomic History  . Marital status: Married    Spouse name: brenda   . Number of children: 1  . Years of education: 9  . Highest education level: 10th grade  Occupational History  . Occupation: retired, Chief Strategy Officer: [employer]  . Financial resource strain: Not hard at all  . Food insecurity:    Worry: Never true    Inability: Never true  . Transportation needs:    Medical: No    Non-medical: No  Tobacco Use  . Smoking status: Former Smoker    Packs/day: 2.00    Years: 15.00    Pack years: 30.00    Types: Cigarettes    Last attempt to quit: 10/20/1978    Years since quitting: 39.9  . Smokeless tobacco: Former Systems developer    Types: [place] date: 10/20/2004  Substance and Sexual Activity  . Alcohol use: No  . Drug use: No  . Sexual activity: Yes  Lifestyle  . Physical activity:    Days per week: 5 days    Minutes per session: 60 min  . Stress: Not at all  Relationships  . Social connections:    Talks on phone: Three times a week    Gets together: Three times a week    Attends religious service: More than 4 times per year    Active member of club or organization: Yes    Attends meetings of clubs or organizations: More than 4 times per year    Relationship status: Married  Other Topics Concern  . Not on file  Social History Narrative   1 healthy daughter   Lives alone with wife and dog     Outpatient Encounter Medications as of 09/13/2018  Medication Sig  . acetaminophen (TYLENOL) 325 MG tablet Take 325 mg by mouth as needed for mild pain.  Marland Kitchen aspirin (ASPIRIN LOW DOSE) 81 MG EC tablet Take 1 tablet (81 mg total) by mouth every morning.  Javier Docker Oil 300 MG CAPS Take 1 capsule by mouth daily.  . metoprolol succinate (TOPROL-XL) 50 MG 24 hr tablet Take 1 tablet (50 mg total) by mouth daily. Take with or immediately following a meal.  . metoprolol succinate (TOPROL-XL) 50 MG 24 hr  tablet TAKE 1 TABLET BY MOUTH ONCE DAILY WITH OR IMMEDIATELY FOLLOWING A MEAL  . Misc Natural Products (BLACK CHERRY CONCENTRATE PO) Take 2 tablets by mouth daily.   . Multiple Vitamin (MULTIVITAMIN WITH MINERALS) TABS Take 1 tablet by mouth daily.  Marland Kitchen  simvastatin (ZOCOR) 40 MG tablet TAKE 1 TABLET BY MOUTH AT BEDTIME  . terazosin (HYTRIN) 1 MG capsule TAKE 1 CAPSULE BY MOUTH TWICE DAILY  . terazosin (HYTRIN) 2 MG capsule TAKE 1 CAPSULE BY MOUTH AT BEDTIME  . loratadine (CLARITIN) 10 MG tablet Take 10 mg by mouth daily.   No facility-administered encounter medications on file as of 09/13/2018.     Activities of Daily Living In your present state of health, do you have any difficulty performing the following activities: 09/13/2018  Hearing? N  Vision? N  Difficulty concentrating or making decisions? Y  Walking or climbing stairs? N  Dressing or bathing? N  Doing errands, shopping? N  Preparing Food and eating ? N  Using the Toilet? N  In the past six months, have you accidently leaked urine? N  Do you have problems with loss of bowel control? N  Managing your Medications? N  Managing your Finances? N  Housekeeping or managing your Housekeeping? N  Some recent data might be hidden    Patient Care Team: Fayrene Helper, MD as PCP - [facility], Marga Melnick, MD (Gastroenterology) Sandford Craze, MD as Referring Physician (Dermatology)   Assessment:   This is a routine wellness examination for [place].  Exercise Activities and Dietary recommendations Current Exercise Habits: Home exercise routine, Type of exercise: walking, Time (Minutes): 30, Frequency (Times/Week): 5, Weekly Exercise (Minutes/Week): 150, Intensity: Mild, Exercise limited by: cardiac condition(s)  Goals    . DIET - INCREASE WATER INTAKE    . Exercise 3x per week (30 min per time)     Recommend starting a routine exercise program at least 3 days a week for 30-45 minutes at a time as tolerated.          Fall Risk Fall Risk  09/13/2018 08/26/2017 06/24/2017 10/09/2016 08/18/2016  Falls in the past year? 0 No No No No   Is the patient's home free of loose throw rugs in walkways, pet beds, electrical cords, etc?   no      Grab bars in the bathroom? yes      Handrails on the stairs?   yes      Adequate lighting?   yes  Timed Get Up and Go Performed: Patient able to perform in 5 seconds without assistance   Depression Screen PHQ 2/9 Scores 09/13/2018 06/24/2017 10/09/2016 08/18/2016  PHQ - 2 Score 0 0 0 0  PHQ- 9 Score - - - -    Cognitive Function     6CIT Screen 09/13/2018 06/24/2017 10/10/2016  What Year? 0 points 0 points 0 points  What month? 0 points 0 points 0 points  What time? 0 points 0 points 0 points  Count back from 20 0 points 0 points 0 points  Months in reverse 0 points 0 points 0 points  Repeat phrase 2 points 0 points 0 points  Total Score 2 0 0    Immunization History  Administered Date(s) Administered  . Influenza Whole 08/12/2010, 08/11/2011  . Influenza, High Dose Seasonal PF 07/28/2018  . Influenza,inj,Quad PF,6+ Mos 07/27/2013, 07/27/2014, 08/07/2015, 09/01/2016, 08/26/2017  . Pneumococcal Conjugate-13 05/01/2014  . Pneumococcal Polysaccharide-23 04/29/2013  . Tdap 08/11/2011  . Zoster 04/10/2014    Qualifies for Shingles Vaccine? Up to date   Screening Tests Health Maintenance  Topic Date Due  . TETANUS/TDAP  08/10/2021  . INFLUENZA VACCINE  Completed  . PNA vac Low Risk Adult  Completed   Cancer Screenings: Lung: Low Dose CT Chest  recommended if Age 43-80 years, 71 pack-year currently smoking OR have quit w/in 15years. Patient does not qualify. Colorectal: up to date   Additional Screenings: Hepatitis C Screening:N?A       Plan:   Continue to exercise, and increase water intake   I have personally reviewed and noted the following in the patient's chart:   . Medical and social history . Use of alcohol, tobacco or illicit drugs   . Current medications and supplements . Functional ability and status . Nutritional status . Physical activity . Advanced directives . List of other physicians . Hospitalizations, surgeries, and ER visits in previous 12 months . Vitals . Screenings to include cognitive, depression, and falls . Referrals and appointments  In addition, I have reviewed and discussed with patient certain preventive protocols, quality metrics, and best practice recommendations. A written personalized care plan for preventive services as well as general preventive health recommendations were provided to patient.     Hayden Pedro, LPN  [id number]

## 2018-09-13 NOTE — Patient Instructions (Signed)
[name] [name] , Thank you for taking time to come for your Medicare Wellness Visit. I appreciate your ongoing commitment to your health goals. Please review the following plan we discussed and let me know if I can assist you in the future.   Screening recommendations/referrals: Colonoscopy: up to date  Recommended yearly ophthalmology/optometry visit for glaucoma screening and checkup Recommended yearly dental visit for hygiene and checkup  Vaccinations: Influenza vaccine: up to date  Pneumococcal vaccine: up to date  Tdap vaccine: up to date  Shingles vaccine: up to date    Advanced directives: in place   Conditions/risks identified: hypertension   Next appointment: Wellness in one year   Preventive Care 86 Years and Older, Male Preventive care refers to lifestyle choices and visits with your health care provider that can promote health and wellness. What does preventive care include?  A yearly physical exam. This is also called an annual well check.  Dental exams once or twice a year.  Routine eye exams. Ask your health care provider how often you should have your eyes checked.  Personal lifestyle choices, including:  Daily care of your teeth and gums.  Regular physical activity.  Eating a healthy diet.  Avoiding tobacco and drug use.  Limiting alcohol use.  Practicing safe sex.  Taking low doses of aspirin every day.  Taking vitamin and mineral supplements as recommended by your health care provider. What happens during an annual well check? The services and screenings done by your health care provider during your annual well check will depend on your age, overall health, lifestyle risk factors, and family history of disease. Counseling  Your health care provider may ask you questions about your:  Alcohol use.  Tobacco use.  Drug use.  Emotional well-being.  Home and relationship well-being.  Sexual activity.  Eating habits.  History of  falls.  Memory and ability to understand (cognition).  Work and work Statistician. Screening  You may have the following tests or measurements:  Height, weight, and BMI.  Blood pressure.  Lipid and cholesterol levels. These may be checked every 5 years, or more frequently if you are over 31 years old.  Skin check.  Lung cancer screening. You may have this screening every year starting at age 68 if you have a 30-pack-year history of smoking and currently smoke or have quit within the past 15 years.  Fecal occult blood test (FOBT) of the stool. You may have this test every year starting at age 53.  Flexible sigmoidoscopy or colonoscopy. You may have a sigmoidoscopy every 5 years or a colonoscopy every 10 years starting at age 23.  Prostate cancer screening. Recommendations will vary depending on your family history and other risks.  Hepatitis C blood test.  Hepatitis B blood test.  Sexually transmitted disease (STD) testing.  Diabetes screening. This is done by checking your blood sugar (glucose) after you have not eaten for a while (fasting). You may have this done every 1-3 years.  Abdominal aortic aneurysm (AAA) screening. You may need this if you are a current or former smoker.  Osteoporosis. You may be screened starting at age 52 if you are at high risk. Talk with your health care provider about your test results, treatment options, and if necessary, the need for more tests. Vaccines  Your health care provider may recommend certain vaccines, such as:  Influenza vaccine. This is recommended every year.  Tetanus, diphtheria, and acellular pertussis (Tdap, Td) vaccine. You may need a Td booster  every 10 years.  Zoster vaccine. You may need this after age 22.  Pneumococcal 13-valent conjugate (PCV13) vaccine. One dose is recommended after age 76.  Pneumococcal polysaccharide (PPSV23) vaccine. One dose is recommended after age 43. Talk to your health care provider about  which screenings and vaccines you need and how often you need them. This information is not intended to replace advice given to you by your health care provider. Make sure you discuss any questions you have with your health care provider. Document Released: 11/02/2015 Document Revised: 06/25/2016 Document Reviewed: 08/07/2015 Elsevier Interactive Patient Education  2017 [place] Prevention in the Home Falls can cause injuries. They can happen to people of all ages. There are many things you can do to make your home safe and to help prevent falls. What can I do on the outside of my home?  Regularly fix the edges of walkways and driveways and fix any cracks.  Remove anything that might make you trip as you walk through a door, such as a raised step or threshold.  Trim any bushes or trees on the path to your home.  Use bright outdoor lighting.  Clear any walking paths of anything that might make someone trip, such as rocks or tools.  Regularly check to see if handrails are loose or broken. Make sure that both sides of any steps have handrails.  Any raised decks and porches should have guardrails on the edges.  Have any leaves, snow, or ice cleared regularly.  Use sand or salt on walking paths during winter.  Clean up any spills in your garage right away. This includes oil or grease spills. What can I do in the bathroom?  Use night lights.  Install grab bars by the toilet and in the tub and shower. Do not use towel bars as grab bars.  Use non-skid mats or decals in the tub or shower.  If you need to sit down in the shower, use a plastic, non-slip stool.  Keep the floor dry. Clean up any water that spills on the floor as soon as it happens.  Remove soap buildup in the tub or shower regularly.  Attach bath mats securely with double-sided non-slip rug tape.  Do not have throw rugs and other things on the floor that can make you trip. What can I do in the  bedroom?  Use night lights.  Make sure that you have a light by your bed that is easy to reach.  Do not use any sheets or blankets that are too big for your bed. They should not hang down onto the floor.  Have a firm chair that has side arms. You can use this for support while you get dressed.  Do not have throw rugs and other things on the floor that can make you trip. What can I do in the kitchen?  Clean up any spills right away.  Avoid walking on wet floors.  Keep items that you use a lot in easy-to-reach places.  If you need to reach something above you, use a strong step stool that has a grab bar.  Keep electrical cords out of the way.  Do not use floor polish or wax that makes floors slippery. If you must use wax, use non-skid floor wax.  Do not have throw rugs and other things on the floor that can make you trip. What can I do with my stairs?  Do not leave any items on the stairs.  Make  sure that there are handrails on both sides of the stairs and use them. Fix handrails that are broken or loose. Make sure that handrails are as long as the stairways.  Check any carpeting to make sure that it is firmly attached to the stairs. Fix any carpet that is loose or worn.  Avoid having throw rugs at the top or bottom of the stairs. If you do have throw rugs, attach them to the floor with carpet tape.  Make sure that you have a light switch at the top of the stairs and the bottom of the stairs. If you do not have them, ask someone to add them for you. What else can I do to help prevent falls?  Wear shoes that:  Do not have high heels.  Have rubber bottoms.  Are comfortable and fit you well.  Are closed at the toe. Do not wear sandals.  If you use a stepladder:  Make sure that it is fully opened. Do not climb a closed stepladder.  Make sure that both sides of the stepladder are locked into place.  Ask someone to hold it for you, if possible.  Clearly mark and make  sure that you can see:  Any grab bars or handrails.  First and last steps.  Where the edge of each step is.  Use tools that help you move around (mobility aids) if they are needed. These include:  Canes.  Walkers.  Scooters.  Crutches.  Turn on the lights when you go into a dark area. Replace any light bulbs as soon as they burn out.  Set up your furniture so you have a clear path. Avoid moving your furniture around.  If any of your floors are uneven, fix them.  If there are any pets around you, be aware of where they are.  Review your medicines with your doctor. Some medicines can make you feel dizzy. This can increase your chance of falling. Ask your doctor what other things that you can do to help prevent falls. This information is not intended to replace advice given to you by your health care provider. Make sure you discuss any questions you have with your health care provider. Document Released: 08/02/2009 Document Revised: 03/13/2016 Document Reviewed: 11/10/2014 Elsevier Interactive Patient Education  2017 [employer].

## 2018-09-14 ENCOUNTER — Other Ambulatory Visit: Payer: Self-pay | Admitting: Family Medicine

## 2018-09-22 ENCOUNTER — Other Ambulatory Visit: Payer: Self-pay | Admitting: Family Medicine

## 2018-09-22 DIAGNOSIS — I10 Essential (primary) hypertension: Secondary | ICD-10-CM

## 2018-10-25 DIAGNOSIS — Z85828 Personal history of other malignant neoplasm of skin: Secondary | ICD-10-CM | POA: Diagnosis not present

## 2018-10-25 DIAGNOSIS — L821 Other seborrheic keratosis: Secondary | ICD-10-CM | POA: Diagnosis not present

## 2018-10-25 DIAGNOSIS — L570 Actinic keratosis: Secondary | ICD-10-CM | POA: Diagnosis not present

## 2018-10-29 ENCOUNTER — Encounter: Payer: Self-pay | Admitting: *Deleted

## 2018-11-03 ENCOUNTER — Other Ambulatory Visit: Payer: Self-pay | Admitting: Family Medicine

## 2018-11-10 IMAGING — CT CT RENAL STONE PROTOCOL
2 of 3 series · 15 of 46 positions shown, 17 images · non-contrast
Comparison: 11/26/2016 radiographs

CLINICAL DATA: Left flank pain for 1 week with constipation. Recent
obstructive ureteral stone.

EXAM:
CT ABDOMEN AND PELVIS WITHOUT CONTRAST
TECHNIQUE: Multidetector CT imaging of the abdomen and pelvis was performed
following the standard protocol without IV contrast.

[Series 2: axial st · axial · 0.81mm/px · z∈[+1096,+1536]mm · 12 of 102 slices shown, 14 images]
[im 7/102  soft-tissue]
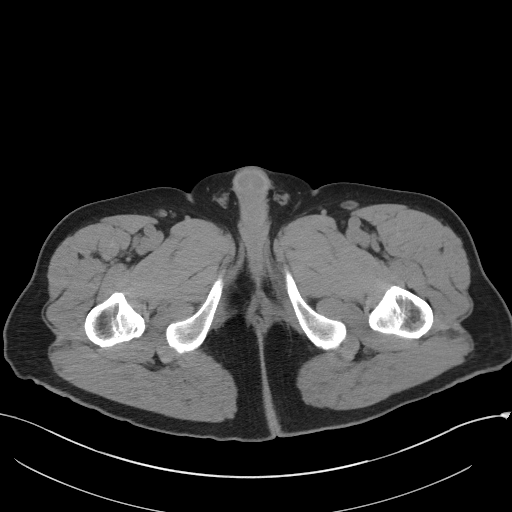
[im 7/102  bone]
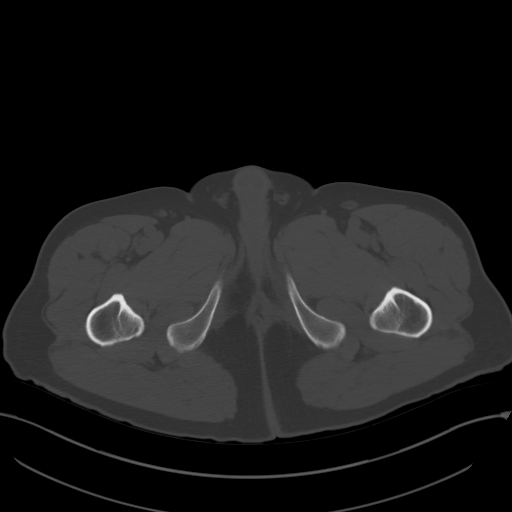
[im 14/102  soft-tissue]
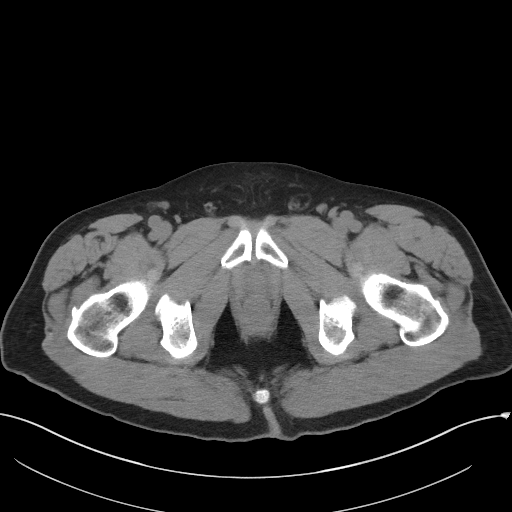
[im 23/102  soft-tissue]
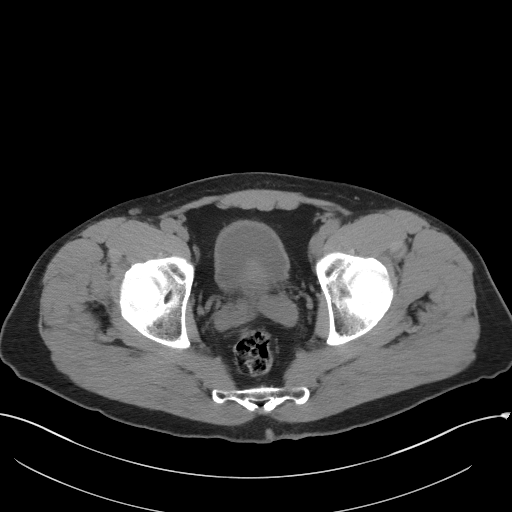
[im 30/102  soft-tissue]
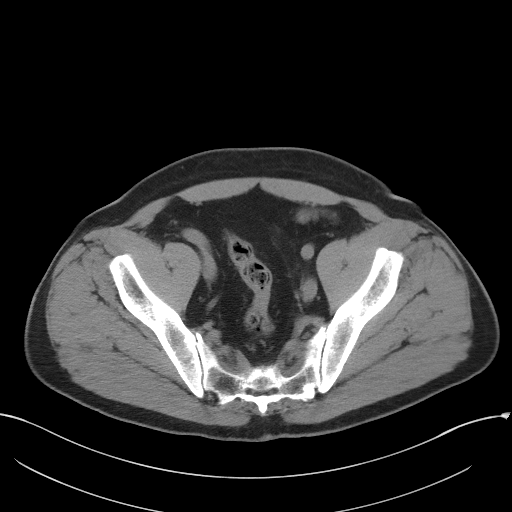
[im 40/102  soft-tissue]
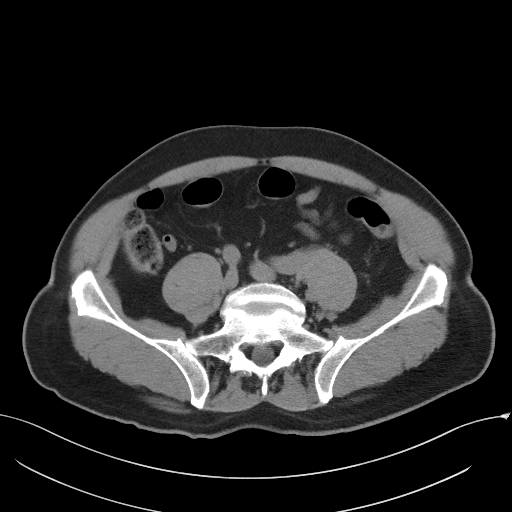
[im 46/102  soft-tissue]
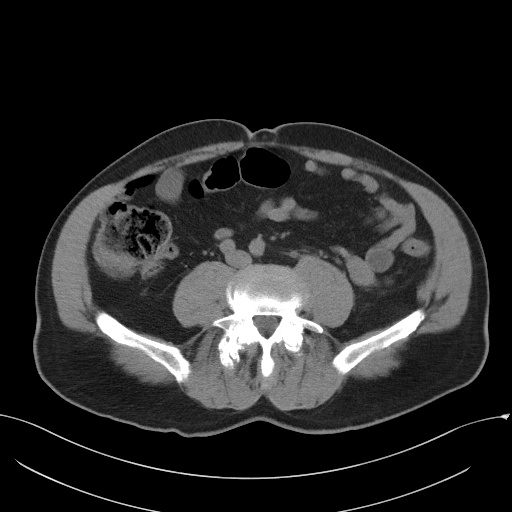
[im 56/102  soft-tissue]
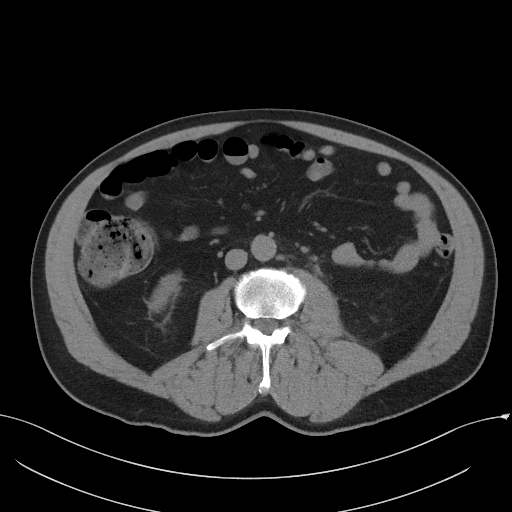
[im 62/102  soft-tissue]
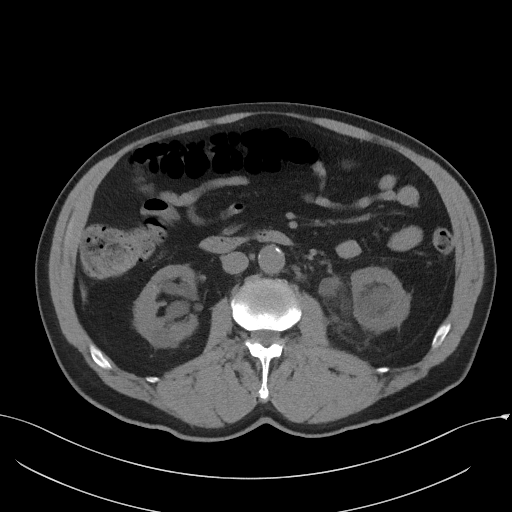
[im 72/102  soft-tissue]
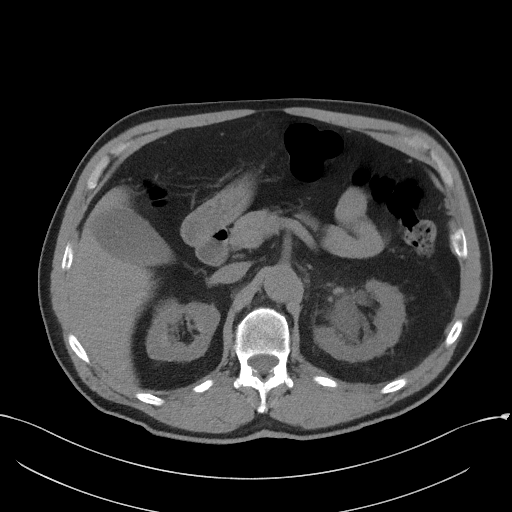
[im 72/102  bone]
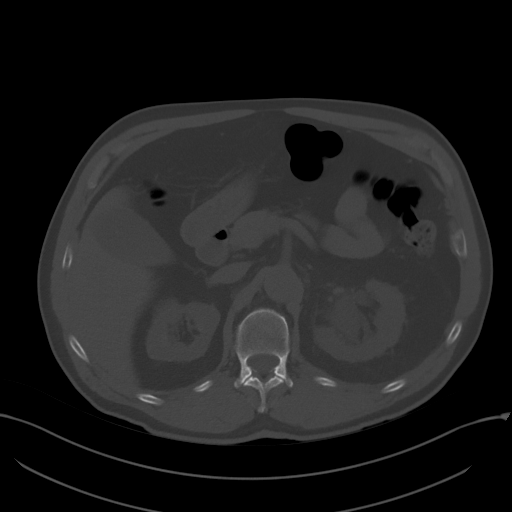
[im 79/102  soft-tissue]
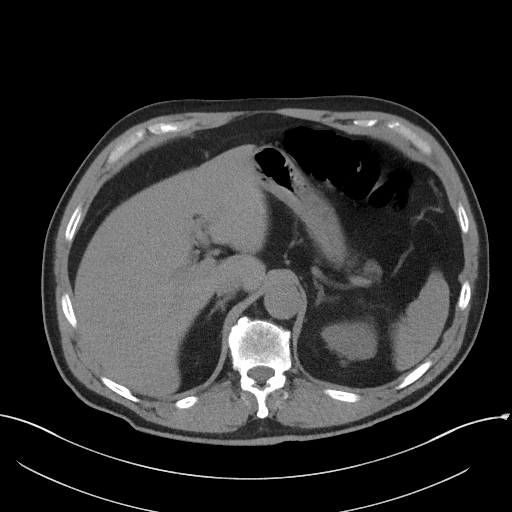
[im 88/102  soft-tissue]
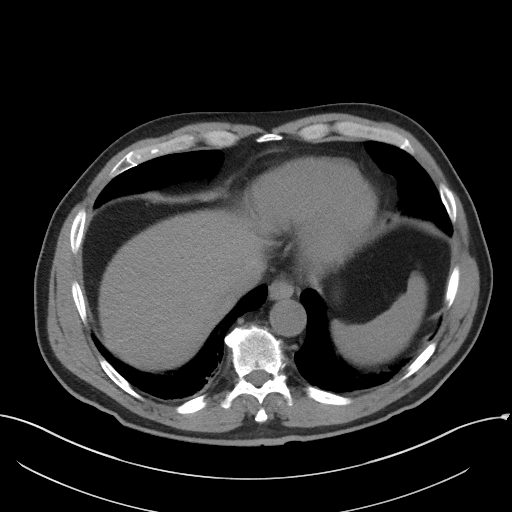
[im 95/102  soft-tissue]
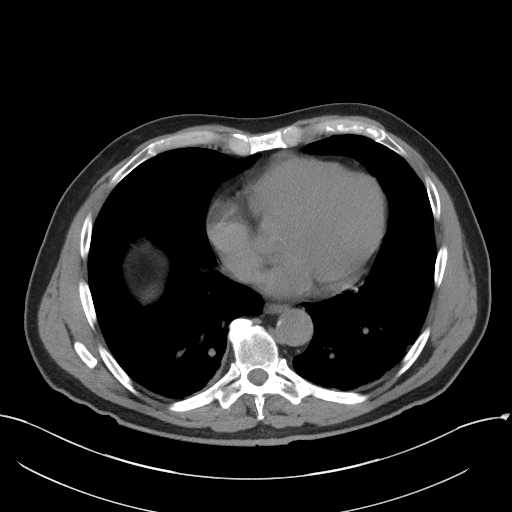

[Series 5: coronal st · coronal · 0.86mm/px · 3 of 101 slices shown]
[im 34/101  soft-tissue]
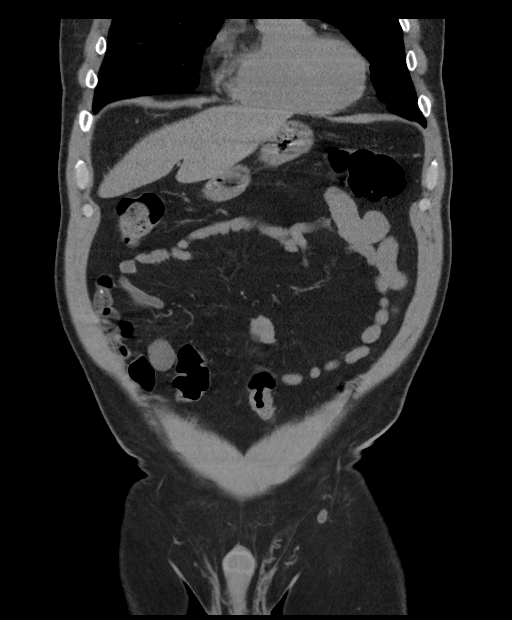
[im 45/101  soft-tissue]
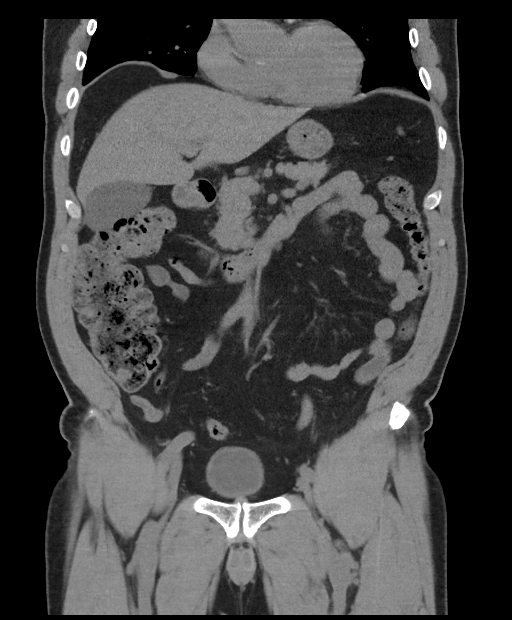
[im 56/101  soft-tissue]
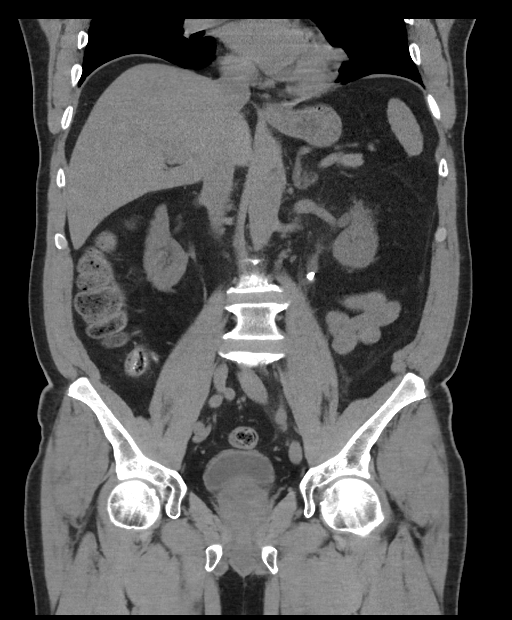

[15 of 46 positions shown; findings below may reference images not displayed]

FINDINGS: Lower chest: 6 by 4 mm right lower lobe nodule, not appreciably
changed from 08/30/2010. Mild atelectasis in both lower lobes and in
the right middle lobe.

Left anterior descending coronary artery atherosclerotic
calcification.

Hepatobiliary: Unremarkable

Pancreas: Unremarkable

Spleen: Unremarkable

Adrenals/Urinary Tract: Adrenal glands normal. Nonobstructive right
nephrolithiasis with about 6 nonobstructive calculi observed, the
largest 3 mm in diameter. Right peripelvic cysts. Normal-appearing
right ureter. 2.4 by 2.0 cm right kidney lower pole partially
exophytic hypodense lesion posteriorly, 8 Hounsfield units. There is
also a mildly hyperdense 6 mm right kidney lower pole lesion on
image 43/2 which is stable and possibly a complex cyst.

Left hydronephrosis due to a 7 mm in long axis left UPJ calculus,
image 45/2. There is periureteral stranding extending along the
proximal half of the ureter but no distal stone seen. In addition
there are approximately 5 additional nonobstructive left renal
calculi and mildly asymmetric left perirenal stranding.

Stomach/Bowel: Unremarkable

Vascular/Lymphatic: Mild aortoiliac atherosclerotic vascular
calcification. Small retroperitoneal lymph nodes are not
pathologically enlarged by size criteria.

Reproductive: Prostate gland measures 4.9 by 4.3 by 6.0 cm (volume =
66 cm^3), with median lobe indenting the bladder base.

Other: No supplemental non-categorized findings.

Musculoskeletal: 7 mm of degenerative grade 1 anterolisthesis at
L5-S1 with facet arthropathy causing moderate left and mild right
foraminal stenosis at this level. Lumbar spondylosis and
degenerative disc disease also cause central narrowing of the thecal
sac at L3-4 and possibly L1-2 and L2-3 ; and foraminal impingement
at the L4-5 level. Lower thoracic spondylosis is also present. A
small umbilical hernia contains adipose tissue.
IMPRESSION: 1. Similar appearance to prior CT, with a nonobstructive left UPJ
calculus causing left hydronephrosis and asymmetric left perirenal
stranding. This stone measures 7 mm in long axis. There is
periureteral stranding in the proximal half of the left ureter but
no distal stone is seen.
2. Multiple tiny punctate bilateral nonobstructive renal calculi are
also present.
3. Enlarged prostate gland, 66 cubic cm.
4. Lumbar spondylosis and degenerative disc disease with 7 mm of
degenerative anterolisthesis at that L5 on S1, and multilevel
suspected impingement as noted above.
5. Chronically stable 5 mm right lower lobe pulmonary nodule,
benign.
6. Coronary and aortoiliac atherosclerotic calcification.

## 2018-12-03 ENCOUNTER — Other Ambulatory Visit: Payer: Self-pay | Admitting: Family Medicine

## 2018-12-07 ENCOUNTER — Other Ambulatory Visit: Payer: Self-pay | Admitting: Family Medicine

## 2018-12-07 ENCOUNTER — Telehealth: Payer: Self-pay | Admitting: Family Medicine

## 2018-12-07 ENCOUNTER — Ambulatory Visit: Payer: PPO | Admitting: Family Medicine

## 2018-12-07 MED ORDER — OSELTAMIVIR PHOSPHATE 75 MG PO CAPS: 75.0000 mg | ORAL_CAPSULE | Freq: Two times a day (BID) | ORAL | 0 refills | Status: AC

## 2018-12-07 NOTE — Telephone Encounter (Signed)
Patient came in today complaining of cough and congestion. Symptoms started yesterday. Daughter tested positive for Flu type A. Script sent in for Tamiflu for patient. Patient advised if not feeling better by tomorrow he needs to be evaluated with verbal understanding.

## 2018-12-07 NOTE — Telephone Encounter (Signed)
Pt comes in with Congestion and cough. The Daughter has tested pos for the Flu

## 2019-01-02 ENCOUNTER — Other Ambulatory Visit: Payer: Self-pay | Admitting: Family Medicine

## 2019-02-21 ENCOUNTER — Encounter: Payer: Self-pay | Admitting: *Deleted

## 2019-03-07 ENCOUNTER — Telehealth: Payer: Self-pay

## 2019-03-07 DIAGNOSIS — R7302 Impaired glucose tolerance (oral): Secondary | ICD-10-CM

## 2019-03-07 DIAGNOSIS — I10 Essential (primary) hypertension: Secondary | ICD-10-CM

## 2019-03-07 DIAGNOSIS — Z1321 Encounter for screening for nutritional disorder: Secondary | ICD-10-CM

## 2019-03-07 DIAGNOSIS — E7849 Other hyperlipidemia: Secondary | ICD-10-CM

## 2019-03-07 NOTE — Telephone Encounter (Signed)
Labs ordered due to patient not having labs drawn in over a year

## 2019-03-08 ENCOUNTER — Encounter: Payer: Self-pay | Admitting: Family Medicine

## 2019-03-08 ENCOUNTER — Ambulatory Visit (INDEPENDENT_AMBULATORY_CARE_PROVIDER_SITE_OTHER): Payer: PPO | Admitting: Family Medicine

## 2019-03-08 VITALS — BP 146/88 | Ht 71.0 in | Wt 220.0 lb

## 2019-03-08 DIAGNOSIS — R7302 Impaired glucose tolerance (oral): Secondary | ICD-10-CM

## 2019-03-08 DIAGNOSIS — N183 Chronic kidney disease, stage 3 unspecified: Secondary | ICD-10-CM

## 2019-03-08 DIAGNOSIS — Z7189 Other specified counseling: Secondary | ICD-10-CM

## 2019-03-08 DIAGNOSIS — E7849 Other hyperlipidemia: Secondary | ICD-10-CM | POA: Diagnosis not present

## 2019-03-08 DIAGNOSIS — I10 Essential (primary) hypertension: Secondary | ICD-10-CM

## 2019-03-08 DIAGNOSIS — N401 Enlarged prostate with lower urinary tract symptoms: Secondary | ICD-10-CM

## 2019-03-08 DIAGNOSIS — R3912 Poor urinary stream: Secondary | ICD-10-CM

## 2019-03-08 MED ORDER — SIMVASTATIN 40 MG PO TABS: 40.0000 mg | ORAL_TABLET | Freq: Every day | ORAL | 3 refills | Status: DC

## 2019-03-08 MED ORDER — METOPROLOL SUCCINATE ER 50 MG PO TB24: 50.0000 mg | ORAL_TABLET | Freq: Every day | ORAL | 3 refills | Status: DC

## 2019-03-08 NOTE — Patient Instructions (Addendum)
Annual physical exam December 4 or after , call if you need me sooner  Labs are excellent, continue to eat mainly vegetables and fruit , and to keep active ( enjoy the tenderloin in limited measure, so far, so good!)   No changes in your medications  Social distancing.Maintain a 6 ft distance whence you do go out Frequent hand washing with soap and water Keeping your hands off of your face.cover your face with a mask outside of your home These 3 practices will help to keep both you and your community healthy during this time. Please practice them faithfully!  Thanks for choosing [facility], we consider it a privelige to serve you.

## 2019-03-09 LAB — CBC
HCT: 40.2 % (ref 38.5–50.0)
Hemoglobin: 13.4 g/dL (ref 13.2–17.1)
MCH: 29.5 pg (ref 27.0–33.0)
MCHC: 33.3 g/dL (ref 32.0–36.0)
MCV: 88.4 fL (ref 80.0–100.0)
MPV: 11.6 fL (ref 7.5–12.5)
Platelets: 200 10*3/uL (ref 140–400)
RBC: 4.55 10*6/uL (ref 4.20–5.80)
RDW: 13.6 % (ref 11.0–15.0)
WBC: 4.9 10*3/uL (ref 3.8–10.8)

## 2019-03-09 LAB — COMPLETE METABOLIC PANEL WITH GFR
AG Ratio: 1.9 (calc) (ref 1.0–2.5)
ALT: 19 U/L (ref 9–46)
AST: 24 U/L (ref 10–35)
Albumin: 4.5 g/dL (ref 3.6–5.1)
Alkaline phosphatase (APISO): 48 U/L (ref 35–144)
BUN/Creatinine Ratio: 12 (calc) (ref 6–22)
BUN: 18 mg/dL (ref 7–25)
CO2: 28 mmol/L (ref 20–32)
Calcium: 9.4 mg/dL (ref 8.6–10.3)
Chloride: 107 mmol/L (ref 98–110)
Creat: 1.49 mg/dL — ABNORMAL HIGH (ref 0.70–1.18)
GFR, Est African American: 52 mL/min/{1.73_m2} — ABNORMAL LOW (ref >=60)
GFR, Est Non African American: 45 mL/min/{1.73_m2} — ABNORMAL LOW (ref >=60)
Globulin: 2.4 g/dL (calc) (ref 1.9–3.7)
Glucose, Bld: 83 mg/dL (ref 65–99)
Potassium: 4.5 mmol/L (ref 3.5–5.3)
Sodium: 142 mmol/L (ref 135–146)
Total Bilirubin: 0.7 mg/dL (ref 0.2–1.2)
Total Protein: 6.9 g/dL (ref 6.1–8.1)

## 2019-03-09 LAB — LIPID PANEL
Cholesterol: 175 mg/dL (ref <200)
HDL: 50 mg/dL (ref >=40)
LDL Cholesterol (Calc): 100 mg/dL (calc) — ABNORMAL HIGH
Non-HDL Cholesterol (Calc): 125 mg/dL (calc) (ref <130)
Total CHOL/HDL Ratio: 3.5 (calc) (ref <5.0)
Triglycerides: 145 mg/dL (ref <150)

## 2019-03-09 LAB — VITAMIN D 25 HYDROXY (VIT D DEFICIENCY, FRACTURES): Vit D, 25-Hydroxy: 29 ng/mL — ABNORMAL LOW (ref 30–100)

## 2019-03-09 LAB — TSH: TSH: 4.31 mIU/L (ref 0.40–4.50)

## 2019-03-09 LAB — PSA: PSA: 1 ng/mL (ref <=4.0)

## 2019-03-09 LAB — HEMOGLOBIN A1C
Hgb A1c MFr Bld: 5.4 % of total Hgb (ref <5.7)
Mean Plasma Glucose: 108 (calc)
eAG (mmol/L): 6 (calc)

## 2019-03-13 ENCOUNTER — Encounter: Payer: Self-pay | Admitting: Family Medicine

## 2019-03-13 DIAGNOSIS — N1832 Chronic kidney disease, stage 3b: Secondary | ICD-10-CM | POA: Insufficient documentation

## 2019-03-13 DIAGNOSIS — Z7189 Other specified counseling: Secondary | ICD-10-CM | POA: Insufficient documentation

## 2019-03-13 DIAGNOSIS — N183 Chronic kidney disease, stage 3 unspecified: Secondary | ICD-10-CM | POA: Insufficient documentation

## 2019-03-13 HISTORY — DX: Other specified counseling: Z71.89

## 2019-03-13 NOTE — Assessment & Plan Note (Signed)
Patient educated about the importance of limiting  Carbohydrate intake , the need to commit to daily physical activity for a minimum of 30 minutes , and to commit weight loss. The fact that changes in all these areas will reduce or eliminate all together the development of diabetes is stressed.  Corrected with change in diet Diabetic Labs Latest Ref Rng & Units 03/07/2019 08/26/2017 02/25/2017 02/17/2017 12/05/2016  HbA1c <5.7 % of total Hgb 5.4 - - - -  Chol <200 mg/dL 175 - - 177 -  HDL > OR = 40 mg/dL 50 - - 52 -  Calc LDL mg/dL (calc) 100(H) - - 102(H) -  Triglycerides <150 mg/dL 145 - - 113 -  Creatinine 0.70 - 1.18 mg/dL 1.49(H) 1.44(H) 1.33(H) 1.42(H) 1.42(H)   BP/Weight 03/08/2019 09/13/2018 09/24/2017 08/26/2017 06/24/2017 02/17/2017 02/10/5360  Systolic BP 443 154 008 676 195 093 267  Diastolic BP 88 88 80 80 82 80 97  Wt. (Lbs) 220 211 - 209 206 210 201  BMI 30.68 30.28 - 29.99 29.56 30.13 28.84   No flowsheet data found.

## 2019-03-13 NOTE — Progress Notes (Signed)
Virtual Visit via Telephone Note  I connected with GRISELDA TOSH on 03/13/19 at 10:40 AM EDT by telephone and verified that I am speaking with the correct person using two identifiers.  Location: Patient: home Provider: office  This visit type is conducted due to national recommendations for restrictions regarding the COVID -19 Pandemic. Due to the patient's age and / or co morbidities, this format is felt to be most appropriate at this time without adequate follow up. The patient has no access to video technology/ had technical difficulties with video, requiring transitioning to audio format  only ( telephone ). All issues noted this document were discussed and addressed,no physical exam can be performed in this format.    I discussed the limitations, risks, security and privacy concerns of performing an evaluation and management service by telephone and the availability of in person appointments. I also discussed with the patient that there may be a patient responsible charge related to this service. The patient expressed understanding and agreed to proceed.   History of Present Illness: F/U chronic problems and medication review Denies recent fever or chills. Denies sinus pressure, nasal congestion, ear pain or sore throat. Denies chest congestion, productive cough or wheezing. Denies chest pains, palpitations and leg swelling Denies abdominal pain, nausea, vomiting,diarrhea or constipation.   Denies dysuria, frequency, hesitancy or incontinence. Denies  joint pain, swelling and limitation in mobility.Knees are her main problem Denies headaches, seizures, numbness, or tingling. Denies depression, anxiety or insomnia. Denies skin break down or rash.       Observations/Objective: BP (!) 146/88   Ht 5\' 11"  (1.803 m)   Wt 220 lb (99.8 kg)   BMI 30.68 kg/m   Good communication with no confusion and intact memory. Alert and oriented x 3 No signs of respiratory distress  during sppech    Assessment and Plan: Essential hypertension noit at goal, needs office f/u DASH diet and commitment to daily physical activity for a minimum of 30 minutes discussed and encouraged, as a part of hypertension management. The importance of attaining a healthy weight is also discussed.  BP/Weight 03/08/2019 09/13/2018 09/24/2017 08/26/2017 06/24/2017 02/17/2017 4/48/1856  Systolic BP 314 970 263 785 885 027 741  Diastolic BP 88 88 80 80 82 80 97  Wt. (Lbs) 220 211 - 209 206 210 201  BMI 30.68 30.28 - 29.99 29.56 30.13 28.84       BPH (benign prostatic hyperplasia) Normal PSA, uses hytrin for obstruction symptoms  Hyperlipidemia Hyperlipidemia:Low fat diet discussed and encouraged.   Lipid Panel  Lab Results  Component Value Date   CHOL 175 03/07/2019   HDL 50 03/07/2019   LDLCALC 100 (H) 03/07/2019   TRIG 145 03/07/2019   CHOLHDL 3.5 03/07/2019  nearly at goal  ]  IGT (impaired glucose tolerance) Patient educated about the importance of limiting  Carbohydrate intake , the need to commit to daily physical activity for a minimum of 30 minutes , and to commit weight loss. The fact that changes in all these areas will reduce or eliminate all together the development of diabetes is stressed.  Corrected with change in diet Diabetic Labs Latest Ref Rng & Units 03/07/2019 08/26/2017 02/25/2017 02/17/2017 12/05/2016  HbA1c <5.7 % of total Hgb 5.4 - - - -  Chol <200 mg/dL 175 - - 177 -  HDL > OR = 40 mg/dL 50 - - 52 -  Calc LDL mg/dL (calc) 100(H) - - 102(H) -  Triglycerides <150 mg/dL 145 - -  113 -  Creatinine 0.70 - 1.18 mg/dL 1.49(H) 1.44(H) 1.33(H) 1.42(H) 1.42(H)   BP/Weight 03/08/2019 09/13/2018 09/24/2017 08/26/2017 06/24/2017 02/17/2017 7/37/1062  Systolic BP 694 854 627 035 009 381 829  Diastolic BP 88 88 80 80 82 80 97  Wt. (Lbs) 220 211 - 209 206 210 201  BMI 30.68 30.28 - 29.99 29.56 30.13 28.84   No flowsheet data found.    CKD (chronic kidney disease) stage 3,  GFR 30-59 ml/min (HCC) Importance of lowering blood pressure and avoiding NSAIDS or nephrotoxic drugs is discussed  Educated About Covid-19 Virus Infection Covid-19 Education  The signs and symptoms of of COVID -19 were discussed with the patient and how to seek care for testing. ( follow up with PCP or arrange  E-visit) The importance of social  distancing is discussed today.     Follow Up Instructions:    I discussed the assessment and treatment plan with the patient. The patient was provided an opportunity to ask questions and all were answered. The patient agreed with the plan and demonstrated an understanding of the instructions.   The patient was advised to call back or seek an in-person evaluation if the symptoms worsen or if the condition fails to improve as anticipated.  I provided 22 minutes of non-face-to-face time during this encounter.   Tula Nakayama, MD

## 2019-03-13 NOTE — Assessment & Plan Note (Signed)
Covid-19 Education  The signs and symptoms of of COVID -19 were discussed with the patient and how to seek care for testing. ( follow up with PCP or arrange  E-visit) The importance of social  distancing is discussed today.  

## 2019-03-13 NOTE — Assessment & Plan Note (Signed)
Normal PSA, uses hytrin for obstruction symptoms

## 2019-03-13 NOTE — Assessment & Plan Note (Signed)
Importance of lowering blood pressure and avoiding NSAIDS or nephrotoxic drugs is discussed

## 2019-03-13 NOTE — Assessment & Plan Note (Signed)
Hyperlipidemia:Low fat diet discussed and encouraged.   Lipid Panel  Lab Results  Component Value Date   CHOL 175 03/07/2019   HDL 50 03/07/2019   LDLCALC 100 (H) 03/07/2019   TRIG 145 03/07/2019   CHOLHDL 3.5 03/07/2019  nearly at goal  ]

## 2019-03-13 NOTE — Assessment & Plan Note (Signed)
noit at goal, needs office f/u DASH diet and commitment to daily physical activity for a minimum of 30 minutes discussed and encouraged, as a part of hypertension management. The importance of attaining a healthy weight is also discussed.  BP/Weight 03/08/2019 09/13/2018 09/24/2017 08/26/2017 06/24/2017 02/17/2017 2/54/8628  Systolic BP 241 753 010 404 591 368 599  Diastolic BP 88 88 80 80 82 80 97  Wt. (Lbs) 220 211 - 209 206 210 201  BMI 30.68 30.28 - 29.99 29.56 30.13 28.84

## 2019-04-02 ENCOUNTER — Other Ambulatory Visit: Payer: Self-pay | Admitting: Family Medicine

## 2019-04-20 DIAGNOSIS — L570 Actinic keratosis: Secondary | ICD-10-CM | POA: Diagnosis not present

## 2019-07-06 ENCOUNTER — Other Ambulatory Visit: Payer: Self-pay | Admitting: Family Medicine

## 2019-07-25 ENCOUNTER — Ambulatory Visit (INDEPENDENT_AMBULATORY_CARE_PROVIDER_SITE_OTHER): Payer: PPO

## 2019-07-25 ENCOUNTER — Other Ambulatory Visit: Payer: Self-pay

## 2019-07-25 DIAGNOSIS — Z23 Encounter for immunization: Secondary | ICD-10-CM

## 2019-08-22 ENCOUNTER — Other Ambulatory Visit: Payer: Self-pay | Admitting: Family Medicine

## 2019-09-19 ENCOUNTER — Ambulatory Visit: Payer: PPO

## 2019-09-20 ENCOUNTER — Ambulatory Visit: Payer: PPO | Admitting: Family Medicine

## 2019-09-20 ENCOUNTER — Other Ambulatory Visit: Payer: Self-pay

## 2019-09-20 ENCOUNTER — Ambulatory Visit: Payer: PPO

## 2019-09-26 ENCOUNTER — Encounter: Payer: Self-pay | Admitting: Family Medicine

## 2019-09-26 ENCOUNTER — Ambulatory Visit (INDEPENDENT_AMBULATORY_CARE_PROVIDER_SITE_OTHER): Payer: PPO | Admitting: Family Medicine

## 2019-09-26 ENCOUNTER — Other Ambulatory Visit: Payer: Self-pay

## 2019-09-26 VITALS — BP 146/88 | HR 58 | Resp 15 | Ht 70.0 in | Wt 211.0 lb

## 2019-09-26 DIAGNOSIS — Z0000 Encounter for general adult medical examination without abnormal findings: Secondary | ICD-10-CM

## 2019-09-26 NOTE — Patient Instructions (Signed)
[name] , Thank you for taking time to come for your Medicare Wellness Visit. I appreciate your ongoing commitment to your health goals. Please review the following plan we discussed and let me know if I can assist you in the future.   Please continue to practice social distancing to keep you, your family, and our community safe.  If you must go out, please wear a Mask and practice good handwashing.  We hope that you have a happy, safe, healthy holiday season!   Screening recommendations/referrals: Colonoscopy: Due 2023 if needed Recommended yearly ophthalmology/optometry visit for glaucoma screening and checkup Recommended yearly dental visit for hygiene and checkup  Vaccinations: Influenza vaccine: Completed due fall 2021  pneumococcal vaccine: Completed Tdap vaccine: Due 2022 Shingles vaccine: Completed  Advanced directives: Please bring in a copy so that we can have one in the chart for you.  Conditions/risks identified: Falls  Next appointment: 10/05/2019   Preventive Care 65 Years and Older, Male Preventive care refers to lifestyle choices and visits with your health care provider that can promote health and wellness. What does preventive care include?  A yearly physical exam. This is also called an annual well check.  Dental exams once or twice a year.  Routine eye exams. Ask your health care provider how often you should have your eyes checked.  Personal lifestyle choices, including:  Daily care of your teeth and gums.  Regular physical activity.  Eating a healthy diet.  Avoiding tobacco and drug use.  Limiting alcohol use.  Practicing safe sex.  Taking low doses of aspirin every day.  Taking vitamin and mineral supplements as recommended by your health care provider. What happens during an annual well check? The services and screenings done by your health care provider during your annual well check will depend on your age, overall health, lifestyle  risk factors, and family history of disease. Counseling  Your health care provider may ask you questions about your:  Alcohol use.  Tobacco use.  Drug use.  Emotional well-being.  Home and relationship well-being.  Sexual activity.  Eating habits.  History of falls.  Memory and ability to understand (cognition).  Work and work Statistician. Screening  You may have the following tests or measurements:  Height, weight, and BMI.  Blood pressure.  Lipid and cholesterol levels. These may be checked every 5 years, or more frequently if you are over 64 years old.  Skin check.  Lung cancer screening. You may have this screening every year starting at age 91 if you have a 30-pack-year history of smoking and currently smoke or have quit within the past 15 years.  Fecal occult blood test (FOBT) of the stool. You may have this test every year starting at age 25.  Flexible sigmoidoscopy or colonoscopy. You may have a sigmoidoscopy every 5 years or a colonoscopy every 10 years starting at age 25.  Prostate cancer screening. Recommendations will vary depending on your family history and other risks.  Hepatitis C blood test.  Hepatitis B blood test.  Sexually transmitted disease (STD) testing.  Diabetes screening. This is done by checking your blood sugar (glucose) after you have not eaten for a while (fasting). You may have this done every 1-3 years.  Abdominal aortic aneurysm (AAA) screening. You may need this if you are a current or former smoker.  Osteoporosis. You may be screened starting at age 13 if you are at high risk. Talk with your health care provider about your test results, treatment  options, and if necessary, the need for more tests. Vaccines  Your health care provider may recommend certain vaccines, such as:  Influenza vaccine. This is recommended every year.  Tetanus, diphtheria, and acellular pertussis (Tdap, Td) vaccine. You may need a Td booster every 10  years.  Zoster vaccine. You may need this after age 61.  Pneumococcal 13-valent conjugate (PCV13) vaccine. One dose is recommended after age 54.  Pneumococcal polysaccharide (PPSV23) vaccine. One dose is recommended after age 1. Talk to your health care provider about which screenings and vaccines you need and how often you need them. This information is not intended to replace advice given to you by your health care provider. Make sure you discuss any questions you have with your health care provider. Document Released: 11/02/2015 Document Revised: 06/25/2016 Document Reviewed: 08/07/2015 Elsevier Interactive Patient Education  2017 [place] Prevention in the Home Falls can cause injuries. They can happen to people of all ages. There are many things you can do to make your home safe and to help prevent falls. What can I do on the outside of my home?  Regularly fix the edges of walkways and driveways and fix any cracks.  Remove anything that might make you trip as you walk through a door, such as a raised step or threshold.  Trim any bushes or trees on the path to your home.  Use bright outdoor lighting.  Clear any walking paths of anything that might make someone trip, such as rocks or tools.  Regularly check to see if handrails are loose or broken. Make sure that both sides of any steps have handrails.  Any raised decks and porches should have guardrails on the edges.  Have any leaves, snow, or ice cleared regularly.  Use sand or salt on walking paths during winter.  Clean up any spills in your garage right away. This includes oil or grease spills. What can I do in the bathroom?  Use night lights.  Install grab bars by the toilet and in the tub and shower. Do not use towel bars as grab bars.  Use non-skid mats or decals in the tub or shower.  If you need to sit down in the shower, use a plastic, non-slip stool.  Keep the floor dry. Clean up any water that  spills on the floor as soon as it happens.  Remove soap buildup in the tub or shower regularly.  Attach bath mats securely with double-sided non-slip rug tape.  Do not have throw rugs and other things on the floor that can make you trip. What can I do in the bedroom?  Use night lights.  Make sure that you have a light by your bed that is easy to reach.  Do not use any sheets or blankets that are too big for your bed. They should not hang down onto the floor.  Have a firm chair that has side arms. You can use this for support while you get dressed.  Do not have throw rugs and other things on the floor that can make you trip. What can I do in the kitchen?  Clean up any spills right away.  Avoid walking on wet floors.  Keep items that you use a lot in easy-to-reach places.  If you need to reach something above you, use a strong step stool that has a grab bar.  Keep electrical cords out of the way.  Do not use floor polish or wax that makes floors slippery.  If you must use wax, use non-skid floor wax.  Do not have throw rugs and other things on the floor that can make you trip. What can I do with my stairs?  Do not leave any items on the stairs.  Make sure that there are handrails on both sides of the stairs and use them. Fix handrails that are broken or loose. Make sure that handrails are as long as the stairways.  Check any carpeting to make sure that it is firmly attached to the stairs. Fix any carpet that is loose or worn.  Avoid having throw rugs at the top or bottom of the stairs. If you do have throw rugs, attach them to the floor with carpet tape.  Make sure that you have a light switch at the top of the stairs and the bottom of the stairs. If you do not have them, ask someone to add them for you. What else can I do to help prevent falls?  Wear shoes that:  Do not have high heels.  Have rubber bottoms.  Are comfortable and fit you well.  Are closed at the  toe. Do not wear sandals.  If you use a stepladder:  Make sure that it is fully opened. Do not climb a closed stepladder.  Make sure that both sides of the stepladder are locked into place.  Ask someone to hold it for you, if possible.  Clearly mark and make sure that you can see:  Any grab bars or handrails.  First and last steps.  Where the edge of each step is.  Use tools that help you move around (mobility aids) if they are needed. These include:  Canes.  Walkers.  Scooters.  Crutches.  Turn on the lights when you go into a dark area. Replace any light bulbs as soon as they burn out.  Set up your furniture so you have a clear path. Avoid moving your furniture around.  If any of your floors are uneven, fix them.  If there are any pets around you, be aware of where they are.  Review your medicines with your doctor. Some medicines can make you feel dizzy. This can increase your chance of falling. Ask your doctor what other things that you can do to help prevent falls. This information is not intended to replace advice given to you by your health care provider. Make sure you discuss any questions you have with your health care provider. Document Released: 08/02/2009 Document Revised: 03/13/2016 Document Reviewed: 11/10/2014 Elsevier Interactive Patient Education  2017 [employer].

## 2019-09-26 NOTE — Progress Notes (Signed)
Subjective:   DEQUARIUS PLACZEK is a 78 y.o. male who presents for Medicare Annual/Subsequent preventive examination.  Location of Patient: Home Location of Provider: Telehealth Consent was obtain for visit to be over via telehealth.  I verified that I am speaking with the correct person using two identifiers.    Review of Systems:    Cardiac Risk Factors include: advanced age (>45men, >88 women);dyslipidemia;hypertension;obesity (BMI >30kg/m2);male gender     Objective:    Vitals: BP (!) 146/88   Pulse (!) 58   Resp 15   Ht 5\' 10"  (1.778 m)   Wt 211 lb (95.7 kg)   BMI 30.28 kg/m   Body mass index is 30.28 kg/m.  Advanced Directives 09/13/2018 06/24/2017 12/08/2016 12/05/2016 11/27/2016 11/26/2016 11/24/2016  Does Patient Have a Medical Advance Directive? No Yes Yes Yes Yes Yes Yes  Type of Advance Directive - [place];Living will Living will Living will Living will Living will Living will  Does patient want to make changes to medical advance directive? - - No - Patient declined No - Patient declined No - Patient declined - -  Copy of [place] in Chart? - No - copy requested No - copy requested No - copy requested - - -  Would patient like information on creating a medical advance directive? Yes (ED - Information included in AVS) - No - Patient declined No - Patient declined - - -  Pre-existing out of facility DNR order (yellow form or pink MOST form) - - - - - - -    Tobacco Social History   Tobacco Use  Smoking Status Former Smoker  . Packs/day: 2.00  . Years: 15.00  . Pack years: 30.00  . Types: Cigarettes  . Quit date: 10/20/1978  . Years since quitting: 40.9  Smokeless Tobacco Former Systems developer  . Types: Chew  . Quit date: 10/20/2004     Counseling given: Yes   Clinical Intake:  Pre-visit preparation completed: Yes  Pain : 0-10 Pain Score: 7  Pain Location: Back Pain Orientation: Lower Pain Descriptors / Indicators: Nagging,  Sharp Pain Onset: More than a month ago Pain Frequency: Intermittent Pain Relieving Factors: pain relieving lotion Effect of Pain on Daily Activities: yes sometimes  Pain Relieving Factors: pain relieving lotion  BMI - recorded: 30.28 Nutritional Status: BMI > 30  Obese Nutritional Risks: None Diabetes: No  How often do you need to have someone help you when you read instructions, pamphlets, or other written materials from your doctor or pharmacy?: 1 - Never What is the last grade level you completed in school?: 10  Interpreter Needed?: No     Past Medical History:  Diagnosis Date  . Anemia, iron deficiency   . Cancer ([place])    skin cancers   . GERD (gastroesophageal reflux disease)   . H/O hiatal hernia   . Headache   . Hemorrhoids    and minimal polp  . History of kidney stones   . History of migraines childhood    Flared up in 2009, and have responded to depakote which was stopped in 9 months  . Hx of colonic polyps 02/24/2007  . Hyperlipidemia 2003  . Hypertension 2003  . Lung nodules since 2009   CT scan advised in 08/2010  . Neoplasm    dermal  . Overweight(278.02)    Past Surgical History:  Procedure Laterality Date  . COLONOSCOPY  2008   Fields-mod IH, 36mm polyp sigmoid colon (disappeared w/  insufflation & could not be retrieved)  . COLONOSCOPY  05/19/2012   Procedure: COLONOSCOPY;  Surgeon: Danie Binder, MD;  Location: [facility];  Service: Endoscopy;  Laterality: N/A;  8:30  . CYSTOSCOPY W/ URETERAL STENT PLACEMENT Left 11/27/2016   Procedure: CYSTOSCOPY WITH RETROGRADE PYELOGRAM/URETERAL STENT PLACEMENT;  Surgeon: Bjorn Loser, MD;  Location: [facility];  Service: Urology;  Laterality: Left;  . ESOPHAGOGASTRODUODENOSCOPY  01/05/2007   Fields-sm HH, benign SB biopsy  . EXTRACORPOREAL SHOCK WAVE LITHOTRIPSY Left 12/08/2016   Procedure: LEFT EXTRACORPOREAL SHOCK WAVE LITHOTRIPSY (ESWL);  Surgeon: Festus Aloe, MD;  Location: [facility];  Service: Urology;   Laterality: Left;  . GIVENS CAPSULE STUDY  01/20/2007   Fields-nothing to explain IDA, lymphectasias  . removal of skin cancer twice     Family History  Problem Relation Age of Onset  . Heart defect Mother        pacemaker   . Alzheimer's disease Mother 76       2011  . Heart defect Brother        bradycardia   . Heart failure Father   . Asthma Father   . Hypertension Daughter   . Kidney Stones Daughter   . Colon cancer Neg Hx    Social History   Socioeconomic History  . Marital status: Married    Spouse name: brenda   . Number of children: 1  . Years of education: 26  . Highest education level: 10th grade  Occupational History  . Occupation: retired, Chief Strategy Officer: [employer]  . Financial resource strain: Not hard at all  . Food insecurity    Worry: Never true    Inability: Never true  . Transportation needs    Medical: No    Non-medical: No  Tobacco Use  . Smoking status: Former Smoker    Packs/day: 2.00    Years: 15.00    Pack years: 30.00    Types: Cigarettes    Quit date: 10/20/1978    Years since quitting: 40.9  . Smokeless tobacco: Former Systems developer    Types: [place] date: 10/20/2004  Substance and Sexual Activity  . Alcohol use: No  . Drug use: No  . Sexual activity: Yes  Lifestyle  . Physical activity    Days per week: 5 days    Minutes per session: 60 min  . Stress: Not at all  Relationships  . Social Herbalist on phone: Three times a week    Gets together: Three times a week    Attends religious service: More than 4 times per year    Active member of club or organization: Yes    Attends meetings of clubs or organizations: More than 4 times per year    Relationship status: Married  Other Topics Concern  . Not on file  Social History Narrative   1 healthy daughter   Lives alone with wife and dog     Outpatient Encounter Medications as of 09/26/2019  Medication Sig  . acetaminophen (TYLENOL) 325 MG  tablet Take 325 mg by mouth as needed for mild pain.  Marland Kitchen aspirin (ASPIRIN LOW DOSE) 81 MG EC tablet Take 1 tablet (81 mg total) by mouth every morning.  Javier Docker Oil 300 MG CAPS Take 1 capsule by mouth daily.  Marland Kitchen loratadine (CLARITIN) 10 MG tablet Take 10 mg by mouth daily.  . metoprolol succinate (TOPROL-XL) 50 MG 24 hr tablet  Take 1 tablet (50 mg total) by mouth daily. Take with or immediately following a meal.  . Misc Natural Products (BLACK CHERRY CONCENTRATE PO) Take 2 tablets by mouth daily.   . Multiple Vitamin (MULTIVITAMIN WITH MINERALS) TABS Take 1 tablet by mouth daily.  . simvastatin (ZOCOR) 40 MG tablet Take 1 tablet (40 mg total) by mouth at bedtime.  Marland Kitchen terazosin (HYTRIN) 1 MG capsule Take 1 capsule by mouth twice daily  . terazosin (HYTRIN) 2 MG capsule Take 1 capsule by mouth at bedtime   No facility-administered encounter medications on file as of 09/26/2019.     Activities of Daily Living In your present state of health, do you have any difficulty performing the following activities: 09/26/2019  Hearing? N  Vision? N  Difficulty concentrating or making decisions? N  Walking or climbing stairs? N  Dressing or bathing? N  Doing errands, shopping? N  Preparing Food and eating ? N  Using the Toilet? N  In the past six months, have you accidently leaked urine? N  Do you have problems with loss of bowel control? N  Managing your Medications? N  Managing your Finances? N  Housekeeping or managing your Housekeeping? N  Some recent data might be hidden    Patient Care Team: Fayrene Helper, MD as PCP - [facility], Marga Melnick, MD (Gastroenterology) Sandford Craze, MD as Referring Physician (Dermatology)   Assessment:   This is a routine wellness examination for [place].  Exercise Activities and Dietary recommendations Current Exercise Habits: Home exercise routine, Type of exercise: walking, Time (Minutes): 60, Frequency (Times/Week): 7, Weekly Exercise  (Minutes/Week): 420, Intensity: Moderate, Exercise limited by: None identified  Goals    . DIET - INCREASE WATER INTAKE    . Exercise 3x per week (30 min per time)     Recommend starting a routine exercise program at least 3 days a week for 30-45 minutes at a time as tolerated.         Fall Risk Fall Risk  09/26/2019 03/08/2019 09/13/2018 08/26/2017 06/24/2017  Falls in the past year? 0 0 0 No No  Number falls in past yr: 0 - - - -  Injury with Fall? 0 0 - - -   Is the patient's home free of loose throw rugs in walkways, pet beds, electrical cords, etc?   yes      Grab bars in the bathroom? yes      Handrails on the stairs?   yes      Adequate lighting?   yes     Depression Screen PHQ 2/9 Scores 09/26/2019 03/08/2019 09/13/2018 06/24/2017  PHQ - 2 Score 0 0 0 0  PHQ- 9 Score - - - -    Cognitive Function     6CIT Screen 09/26/2019 09/13/2018 06/24/2017 10/10/2016  What Year? 0 points 0 points 0 points 0 points  What month? 0 points 0 points 0 points 0 points  What time? 0 points 0 points 0 points 0 points  Count back from 20 0 points 0 points 0 points 0 points  Months in reverse 0 points 0 points 0 points 0 points  Repeat phrase 0 points 2 points 0 points 0 points  Total Score 0 2 0 0    Immunization History  Administered Date(s) Administered  . Fluad Quad(high Dose 65+) 07/25/2019  . Influenza Whole 08/12/2010, 08/11/2011  . Influenza, High Dose Seasonal PF 07/28/2018  . Influenza,inj,Quad PF,6+ Mos 07/27/2013, 07/27/2014, 08/07/2015, 09/01/2016, 08/26/2017  .  Pneumococcal Conjugate-13 05/01/2014  . Pneumococcal Polysaccharide-23 04/29/2013  . Tdap 08/11/2011  . Zoster 04/10/2014    Qualifies for Shingles Vaccine?  completed  Screening Tests Health Maintenance  Topic Date Due  . TETANUS/TDAP  08/10/2021  . INFLUENZA VACCINE  Completed  . PNA vac Low Risk Adult  Completed   Cancer Screenings: Lung: Low Dose CT Chest recommended if Age 26-80 years, 30 pack-year  currently smoking OR have quit w/in 15years. Patient does not qualify. Colorectal:  Due 2023  Additional Screenings:   Hepatitis C Screening:       Plan:      1. Encounter for Medicare annual wellness exam I have personally reviewed and noted the following in the patient's chart:   . Medical and social history . Use of alcohol, tobacco or illicit drugs  . Current medications and supplements . Functional ability and status . Nutritional status . Physical activity . Advanced directives . List of other physicians . Hospitalizations, surgeries, and ER visits in previous 12 months . Vitals . Screenings to include cognitive, depression, and falls . Referrals and appointments  In addition, I have reviewed and discussed with patient certain preventive protocols, quality metrics, and best practice recommendations. A written personalized care plan for preventive services as well as general preventive health recommendations were provided to patient.   I provided 20 minutes of non-face-to-face time during this encounter.   Perlie Mayo, NP  09/26/2019

## 2019-09-28 ENCOUNTER — Telehealth: Payer: Self-pay | Admitting: *Deleted

## 2019-09-28 DIAGNOSIS — R7301 Impaired fasting glucose: Secondary | ICD-10-CM | POA: Diagnosis not present

## 2019-09-28 DIAGNOSIS — E7849 Other hyperlipidemia: Secondary | ICD-10-CM | POA: Diagnosis not present

## 2019-09-28 DIAGNOSIS — E559 Vitamin D deficiency, unspecified: Secondary | ICD-10-CM

## 2019-09-28 DIAGNOSIS — M9905 Segmental and somatic dysfunction of pelvic region: Secondary | ICD-10-CM | POA: Diagnosis not present

## 2019-09-28 DIAGNOSIS — I10 Essential (primary) hypertension: Secondary | ICD-10-CM | POA: Diagnosis not present

## 2019-09-28 DIAGNOSIS — M9903 Segmental and somatic dysfunction of lumbar region: Secondary | ICD-10-CM | POA: Diagnosis not present

## 2019-09-28 DIAGNOSIS — M9902 Segmental and somatic dysfunction of thoracic region: Secondary | ICD-10-CM | POA: Diagnosis not present

## 2019-09-28 DIAGNOSIS — M545 Low back pain: Secondary | ICD-10-CM | POA: Diagnosis not present

## 2019-09-28 NOTE — Telephone Encounter (Signed)
Which labs would you like for him to have drawn? Lipid, cmp w eGFR, A1C?

## 2019-09-28 NOTE — Telephone Encounter (Signed)
Pt needs updated labs orders for upcoming appt

## 2019-09-28 NOTE — Telephone Encounter (Signed)
Ordered labs because they called wanting to go ahead and go since they were fasting

## 2019-09-29 ENCOUNTER — Encounter: Payer: Self-pay | Admitting: Family Medicine

## 2019-09-29 LAB — COMPLETE METABOLIC PANEL WITH GFR
AG Ratio: 1.8 (calc) (ref 1.0–2.5)
ALT: 18 U/L (ref 9–46)
AST: 21 U/L (ref 10–35)
Albumin: 4.4 g/dL (ref 3.6–5.1)
Alkaline phosphatase (APISO): 53 U/L (ref 35–144)
BUN/Creatinine Ratio: 10 (calc) (ref 6–22)
BUN: 15 mg/dL (ref 7–25)
CO2: 27 mmol/L (ref 20–32)
Calcium: 9.5 mg/dL (ref 8.6–10.3)
Chloride: 107 mmol/L (ref 98–110)
Creat: 1.47 mg/dL — ABNORMAL HIGH (ref 0.70–1.18)
GFR, Est African American: 53 mL/min/{1.73_m2} — ABNORMAL LOW (ref >=60)
GFR, Est Non African American: 45 mL/min/{1.73_m2} — ABNORMAL LOW (ref >=60)
Globulin: 2.5 g/dL (calc) (ref 1.9–3.7)
Glucose, Bld: 87 mg/dL (ref 65–99)
Potassium: 4.7 mmol/L (ref 3.5–5.3)
Sodium: 143 mmol/L (ref 135–146)
Total Bilirubin: 0.5 mg/dL (ref 0.2–1.2)
Total Protein: 6.9 g/dL (ref 6.1–8.1)

## 2019-09-29 LAB — CBC
HCT: 41.4 % (ref 38.5–50.0)
Hemoglobin: 13.8 g/dL (ref 13.2–17.1)
MCH: 29.4 pg (ref 27.0–33.0)
MCHC: 33.3 g/dL (ref 32.0–36.0)
MCV: 88.1 fL (ref 80.0–100.0)
MPV: 11.1 fL (ref 7.5–12.5)
Platelets: 180 10*3/uL (ref 140–400)
RBC: 4.7 10*6/uL (ref 4.20–5.80)
RDW: 13 % (ref 11.0–15.0)
WBC: 5.6 10*3/uL (ref 3.8–10.8)

## 2019-09-29 LAB — TSH: TSH: 3.07 mIU/L (ref 0.40–4.50)

## 2019-09-29 LAB — LIPID PANEL
Cholesterol: 192 mg/dL (ref <200)
HDL: 51 mg/dL (ref >=40)
LDL Cholesterol (Calc): 115 mg/dL (calc) — ABNORMAL HIGH
Non-HDL Cholesterol (Calc): 141 mg/dL (calc) — ABNORMAL HIGH (ref <130)
Total CHOL/HDL Ratio: 3.8 (calc) (ref <5.0)
Triglycerides: 138 mg/dL (ref <150)

## 2019-09-29 LAB — VITAMIN D 25 HYDROXY (VIT D DEFICIENCY, FRACTURES): Vit D, 25-Hydroxy: 20 ng/mL — ABNORMAL LOW (ref 30–100)

## 2019-09-29 LAB — HEMOGLOBIN A1C
Hgb A1c MFr Bld: 5.6 % of total Hgb (ref <5.7)
Mean Plasma Glucose: 114 (calc)
eAG (mmol/L): 6.3 (calc)

## 2019-09-30 ENCOUNTER — Other Ambulatory Visit: Payer: Self-pay | Admitting: Family Medicine

## 2019-09-30 DIAGNOSIS — M545 Low back pain: Secondary | ICD-10-CM | POA: Diagnosis not present

## 2019-09-30 DIAGNOSIS — M9905 Segmental and somatic dysfunction of pelvic region: Secondary | ICD-10-CM | POA: Diagnosis not present

## 2019-09-30 DIAGNOSIS — M9903 Segmental and somatic dysfunction of lumbar region: Secondary | ICD-10-CM | POA: Diagnosis not present

## 2019-09-30 DIAGNOSIS — M9902 Segmental and somatic dysfunction of thoracic region: Secondary | ICD-10-CM | POA: Diagnosis not present

## 2019-10-05 ENCOUNTER — Ambulatory Visit (INDEPENDENT_AMBULATORY_CARE_PROVIDER_SITE_OTHER): Payer: PPO | Admitting: Family Medicine

## 2019-10-05 ENCOUNTER — Encounter: Payer: PPO | Admitting: Family Medicine

## 2019-10-05 ENCOUNTER — Other Ambulatory Visit: Payer: Self-pay

## 2019-10-05 ENCOUNTER — Encounter: Payer: Self-pay | Admitting: Family Medicine

## 2019-10-05 VITALS — BP 142/86 | HR 71 | Temp 98.0°F | Resp 15 | Ht 70.0 in | Wt 224.0 lb

## 2019-10-05 DIAGNOSIS — E7849 Other hyperlipidemia: Secondary | ICD-10-CM | POA: Diagnosis not present

## 2019-10-05 DIAGNOSIS — E669 Obesity, unspecified: Secondary | ICD-10-CM | POA: Diagnosis not present

## 2019-10-05 DIAGNOSIS — I10 Essential (primary) hypertension: Secondary | ICD-10-CM | POA: Diagnosis not present

## 2019-10-05 DIAGNOSIS — Z0000 Encounter for general adult medical examination without abnormal findings: Secondary | ICD-10-CM

## 2019-10-05 DIAGNOSIS — M545 Low back pain: Secondary | ICD-10-CM | POA: Diagnosis not present

## 2019-10-05 DIAGNOSIS — M9905 Segmental and somatic dysfunction of pelvic region: Secondary | ICD-10-CM | POA: Diagnosis not present

## 2019-10-05 DIAGNOSIS — M9902 Segmental and somatic dysfunction of thoracic region: Secondary | ICD-10-CM | POA: Diagnosis not present

## 2019-10-05 DIAGNOSIS — M9903 Segmental and somatic dysfunction of lumbar region: Secondary | ICD-10-CM | POA: Diagnosis not present

## 2019-10-05 MED ORDER — POTASSIUM CHLORIDE ER 10 MEQ PO TBCR: 10.0000 meq | EXTENDED_RELEASE_TABLET | Freq: Every day | ORAL | 5 refills | Status: DC

## 2019-10-05 MED ORDER — HYDROCHLOROTHIAZIDE 12.5 MG PO CAPS: 12.5000 mg | ORAL_CAPSULE | Freq: Every day | ORAL | 5 refills | Status: DC

## 2019-10-05 NOTE — Patient Instructions (Signed)
F/U in office with MD for bP re evaluation in 3 months, call if you need me sooner  Please reduce salty and processed foods, also sugary foods and starchy foods and commit top more regular exercise  New additional medication for your blood pressure, HCTZ and potassium  Thanks for choosing [facility], we consider it a privelige to serve you.  It is important that you exercise regularly at least 30 minutes 5 times a week. If you develop chest pain, have severe difficulty breathing, or feel very tired, stop exercising immediately and seek medical attention  Think about what you will eat, plan ahead. Choose " clean, green, fresh or frozen" over canned, processed or packaged foods which are more sugary, salty and fatty. 70 to 75% of food eaten should be vegetables and fruit. Three meals at set times with snacks allowed between meals, but they must be fruit or vegetables. Aim to eat over a 12 hour period , example 7 am to 7 pm, and STOP after  your last meal of the day. Drink water,generally about 64 ounces per day, no other drink is as healthy. Fruit juice is best enjoyed in a healthy way, by EATING the fruit.

## 2019-10-05 NOTE — Assessment & Plan Note (Addendum)
Uncontrolled blood pressure, add hCTZ and potassium and re evaluate DASH diet and commitment to daily physical activity for a minimum of 30 minutes discussed and encouraged, as a part of hypertension management. The importance of attaining a healthy weight is also discussed.  BP/Weight 10/05/2019 09/26/2019 03/08/2019 09/13/2018 09/24/2017 [id number]  Systolic BP A999333 123456 123456 Q000111Q Q000111Q [id number]  Diastolic BP 86 88 88 88 80 80 82  Wt. (Lbs) 224 211 220 211 - 209 206  BMI 32.14 30.28 30.68 30.28 - 29.99 29.56

## 2019-10-05 NOTE — Assessment & Plan Note (Signed)

## 2019-10-09 NOTE — Progress Notes (Signed)
[name] [name]     MRN: [id number]      DOB: 1940/12/16   HPI: Patient is in for annual physical exam. Uncontrolled hypertensio is adressed Recent labs are reviewed, lifestly mpodification only, no med changesImmunization is reviewed , and  updated if needed.    PE; BP (!) 142/86   Pulse 71   Temp 98 F (36.7 C) (Temporal)   Resp 15   Ht 5\' 10"  (1.778 m)   Wt 224 lb (101.6 kg)   SpO2 96%   BMI 32.14 kg/m   Pleasant male, alert and oriented x 3, in no cardio-pulmonary distress. Afebrile. HEENT No facial trauma or asymetry. Sinuses non tender. EOMI External ears normal,  Neck: supple, no adenopathy,JVD or thyromegaly.No bruits.  Chest: Clear to ascultation bilaterally.No crackles or wheezes. Non tender to palpation  Cardiovascular system; Heart sounds normal,  S1 and  S2 ,no S3.  No murmur, or thrill. Apical beat not displaced Peripheral pulses normal.  Abdomen: Soft, non tender, no organomegaly or masses. No bruits. Bowel sounds normal. No guarding, tenderness or rebound.    Musculoskeletal exam: Decreased though adequate l ROM of spine, hips , shoulders and knees. No deformity ,swelling or crepitus noted. No muscle wasting or atrophy.   Neurologic: Cranial nerves 2 to 12 intact. Power, tone ,sensation and reflexes normal throughout. No disturbance in gait. No tremor.  Skin: Intact, no ulceration, erythema , scaling or rash noted. Pigmentation normal throughout  Psych; Normal mood and affect. Judgement and concentration normal   Assessment & Plan:  Annual physical exam Annual exam as documented. Counseling done  re healthy lifestyle involving commitment to 150 minutes exercise per week, heart healthy diet, and attaining healthy weight.The importance of adequate sleep also discussed. Regular seat belt use and home safety, is also discussed. Changes in health habits are decided on by the patient with goals and time frames  set for achieving  them. Immunization and cancer screening needs are specifically addressed at this visit.   Essential hypertension Uncontrolled blood pressure, add hCTZ and potassium and re evaluate DASH diet and commitment to daily physical activity for a minimum of 30 minutes discussed and encouraged, as a part of hypertension management. The importance of attaining a healthy weight is also discussed.  BP/Weight 10/05/2019 09/26/2019 03/08/2019 09/13/2018 09/24/2017 [id number]  Systolic BP A999333 123456 123456 Q000111Q Q000111Q [id number]  Diastolic BP 86 88 88 88 80 80 82  Wt. (Lbs) 224 211 220 211 - 209 206  BMI 32.14 30.28 30.68 30.28 - 29.99 29.56       Obesity (BMI 30.0-34.9)  Patient re-educated about  the importance of commitment to a  minimum of 150 minutes of exercise per week as able.  The importance of healthy food choices with portion control discussed, as well as eating regularly and within a 12 hour window most days. The need to choose "clean , green" food 50 to 75% of the time is discussed, as well as to make water the primary drink and set a goal of 64 ounces water daily.    Weight /BMI 10/05/2019 09/26/2019 03/08/2019  WEIGHT 224 lb 211 lb 220 lb  HEIGHT 5\' 10"  5\' 10"  5\' 11"   BMI 32.14 kg/m2 30.28 kg/m2 30.68 kg/m2      Hyperlipidemia Hyperlipidemia:Low fat diet discussed and encouraged.   Lipid Panel  Lab Results  Component Value Date   CHOL 192 09/28/2019   HDL 51 09/28/2019   LDLCALC 115 (H) 09/28/2019  TRIG 138 09/28/2019   CHOLHDL 3.8 09/28/2019     Needs to lower fat intake, not at goal

## 2019-10-09 NOTE — Assessment & Plan Note (Signed)
Hyperlipidemia:Low fat diet discussed and encouraged.   Lipid Panel  Lab Results  Component Value Date   CHOL 192 09/28/2019   HDL 51 09/28/2019   LDLCALC 115 (H) 09/28/2019   TRIG 138 09/28/2019   CHOLHDL 3.8 09/28/2019     Needs to lower fat intake, not at goal

## 2019-10-09 NOTE — Assessment & Plan Note (Signed)
  Patient re-educated about  the importance of commitment to a  minimum of 150 minutes of exercise per week as able.  The importance of healthy food choices with portion control discussed, as well as eating regularly and within a 12 hour window most days. The need to choose "clean , green" food 50 to 75% of the time is discussed, as well as to make water the primary drink and set a goal of 64 ounces water daily.    Weight /BMI 10/05/2019 09/26/2019 03/08/2019  WEIGHT 224 lb 211 lb 220 lb  HEIGHT 5\' 10"  5\' 10"  5\' 11"   BMI 32.14 kg/m2 30.28 kg/m2 30.68 kg/m2

## 2019-10-10 ENCOUNTER — Ambulatory Visit: Payer: PPO

## 2019-10-24 DIAGNOSIS — L570 Actinic keratosis: Secondary | ICD-10-CM | POA: Diagnosis not present

## 2019-11-21 ENCOUNTER — Other Ambulatory Visit: Payer: Self-pay | Admitting: Family Medicine

## 2019-12-29 ENCOUNTER — Other Ambulatory Visit: Payer: Self-pay | Admitting: Family Medicine

## 2019-12-30 ENCOUNTER — Other Ambulatory Visit: Payer: Self-pay | Admitting: Family Medicine

## 2020-01-10 ENCOUNTER — Ambulatory Visit: Payer: PPO | Admitting: Family Medicine

## 2020-01-30 DIAGNOSIS — S46011A Strain of muscle(s) and tendon(s) of the rotator cuff of right shoulder, initial encounter: Secondary | ICD-10-CM | POA: Diagnosis not present

## 2020-02-13 DIAGNOSIS — S46011D Strain of muscle(s) and tendon(s) of the rotator cuff of right shoulder, subsequent encounter: Secondary | ICD-10-CM | POA: Diagnosis not present

## 2020-02-14 DIAGNOSIS — M12811 Other specific arthropathies, not elsewhere classified, right shoulder: Secondary | ICD-10-CM | POA: Diagnosis not present

## 2020-02-14 DIAGNOSIS — M75101 Unspecified rotator cuff tear or rupture of right shoulder, not specified as traumatic: Secondary | ICD-10-CM | POA: Diagnosis not present

## 2020-02-14 DIAGNOSIS — M25511 Pain in right shoulder: Secondary | ICD-10-CM | POA: Diagnosis not present

## 2020-02-14 DIAGNOSIS — M7551 Bursitis of right shoulder: Secondary | ICD-10-CM | POA: Diagnosis not present

## 2020-02-14 DIAGNOSIS — M25411 Effusion, right shoulder: Secondary | ICD-10-CM | POA: Diagnosis not present

## 2020-02-16 ENCOUNTER — Other Ambulatory Visit: Payer: Self-pay

## 2020-02-16 ENCOUNTER — Ambulatory Visit (INDEPENDENT_AMBULATORY_CARE_PROVIDER_SITE_OTHER): Payer: PPO | Admitting: Family Medicine

## 2020-02-16 ENCOUNTER — Encounter: Payer: Self-pay | Admitting: Family Medicine

## 2020-02-16 VITALS — BP 140/82 | HR 69 | Temp 98.2°F | Resp 16 | Ht 70.0 in | Wt 223.0 lb

## 2020-02-16 DIAGNOSIS — I10 Essential (primary) hypertension: Secondary | ICD-10-CM

## 2020-02-16 DIAGNOSIS — N183 Chronic kidney disease, stage 3 unspecified: Secondary | ICD-10-CM

## 2020-02-16 DIAGNOSIS — E7849 Other hyperlipidemia: Secondary | ICD-10-CM

## 2020-02-16 DIAGNOSIS — R7302 Impaired glucose tolerance (oral): Secondary | ICD-10-CM | POA: Diagnosis not present

## 2020-02-16 MED ORDER — HYDROCHLOROTHIAZIDE 25 MG PO TABS: 25.0000 mg | ORAL_TABLET | Freq: Every day | ORAL | 2 refills | Status: DC

## 2020-02-16 NOTE — Assessment & Plan Note (Signed)
Encouraged a low-fat diet

## 2020-02-16 NOTE — Assessment & Plan Note (Signed)
Blood pressure is still at borderline control.  Will increase hydrochlorothiazide to 25 mg and bring back in 3 months to see how he is doing.  We will get updated labs as well.  Making sure that kidney function is stable.

## 2020-02-16 NOTE — Assessment & Plan Note (Signed)
Trying to avoid elevated blood pressure.  Adjustment of medications today.  Follow-up in 3 months.  Encouraged to avoid NSAIDs and other nephrotoxic drugs as discussed previously.

## 2020-02-16 NOTE — Assessment & Plan Note (Signed)
I have encouraged a heart healthy low-sodium low-salt low sugar diet.  We will get updated labs.

## 2020-02-16 NOTE — Progress Notes (Signed)
Subjective:  Patient ID: [name] [name], male    DOB: October 28, 1940  Age: 79 y.o. MRN: [id number]  CC:  Chief Complaint  Patient presents with  . Hypertension    3 month follow up visit       HPI  HPI [name] [name] is a 79 year old male patient of Dr. Griffin Dakin.  Who presents today for follow-up on chronic conditions predominantly including hypertension.  Had an adjustment in his medications back in December which to include 12.5 mg of hydrochlorothiazide to see if he can get his blood pressure 130/80 or lower.  He presents today with 140/80 which is pretty consistent with what he was back then.  He reports taking the medication without any issue or any trouble.  Denies having any side effects.  Denies having any chest pain, shortness of breath, leg swelling, palpitations, headaches, dizziness or any other signs or symptoms of uncontrolled blood pressure.    Today patient denies signs and symptoms of COVID 19 infection including fever, chills, cough, shortness of breath, and headache. Past Medical, Surgical, Social History, Allergies, and Medications have been Reviewed.   Past Medical History:  Diagnosis Date  . Anemia, iron deficiency   . Annual physical exam 09/01/2016  . Cancer ([place])    skin cancers   . Educated about COVID-19 virus infection 03/13/2019  . ELECTROCARDIOGRAM, ABNORMAL 08/18/2010   Qualifier: Diagnosis of  By: Moshe Cipro MD, Joycelyn Schmid    . GERD (gastroesophageal reflux disease)   . H/O hiatal hernia   . Headache   . Hemorrhoids    and minimal polp  . History of kidney stones   . History of migraines childhood    Flared up in 2009, and have responded to depakote which was stopped in 9 months  . Hx of colonic polyps 02/24/2007  . Hyperlipidemia 2003  . Hypertension 2003  . Low sodium levels 02/20/2017  . Lung nodules since 2009   CT scan advised in 08/2010  . Neoplasm    dermal  . Overweight(278.02)     Current Meds  Medication Sig  . acetaminophen  (TYLENOL) 325 MG tablet Take 325 mg by mouth as needed for mild pain.  Marland Kitchen aspirin (ASPIRIN LOW DOSE) 81 MG EC tablet Take 1 tablet (81 mg total) by mouth every morning.  Javier Docker Oil 300 MG CAPS Take 1 capsule by mouth daily.  Marland Kitchen loratadine (CLARITIN) 10 MG tablet Take 10 mg by mouth daily.  . metoprolol succinate (TOPROL-XL) 50 MG 24 hr tablet Take 1 tablet (50 mg total) by mouth daily. Take with or immediately following a meal.  . Misc Natural Products (BLACK CHERRY CONCENTRATE PO) Take 2 tablets by mouth daily.   . Multiple Vitamin (MULTIVITAMIN WITH MINERALS) TABS Take 1 tablet by mouth daily.  . potassium chloride (KLOR-CON 10) 10 MEQ tablet Take 1 tablet (10 mEq total) by mouth daily.  . simvastatin (ZOCOR) 40 MG tablet Take 1 tablet (40 mg total) by mouth at bedtime.  Marland Kitchen terazosin (HYTRIN) 1 MG capsule Take 1 capsule by mouth twice daily  . terazosin (HYTRIN) 2 MG capsule Take 1 capsule by mouth at bedtime  . [DISCONTINUED] hydrochlorothiazide (MICROZIDE) 12.5 MG capsule Take 1 capsule (12.5 mg total) by mouth daily.    ROS:  Review of Systems  Constitutional: Negative.   HENT: Negative.   Eyes: Negative.   Respiratory: Negative.   Cardiovascular: Negative.   Gastrointestinal: Negative.   Genitourinary: Negative.   Musculoskeletal: Negative.   Skin:  Negative.   Neurological: Negative.   Endo/Heme/Allergies: Negative.   Psychiatric/Behavioral: Negative.   All other systems reviewed and are negative.    Objective:   Today's Vitals: BP 140/82   Pulse 69   Temp 98.2 F (36.8 C) (Temporal)   Resp 16   Ht 5\' 10"  (1.778 m)   Wt 223 lb (101.2 kg)   SpO2 95%   BMI 32.00 kg/m  Vitals with BMI 02/16/2020 10/05/2019 09/26/2019  Height 5\' 10"  5\' 10"  5\' 10"   Weight 223 lbs 224 lbs 211 lbs  BMI 32 Q000111Q [id number]  Systolic [id number] A999333 123456  Diastolic 82 86 88  Pulse 69 71 58     Physical Exam Vitals and nursing note reviewed.  Constitutional:      Appearance: Normal appearance. He is  well-developed, well-groomed and overweight.  HENT:     Head: Normocephalic and atraumatic.     Right Ear: External ear normal.     Left Ear: External ear normal.     Mouth/Throat:     Comments: Mask in place  Eyes:     General:        Right eye: No discharge.        Left eye: No discharge.     Conjunctiva/sclera: Conjunctivae normal.  Cardiovascular:     Rate and Rhythm: Normal rate and regular rhythm.     Pulses: Normal pulses.     Heart sounds: Normal heart sounds.  Pulmonary:     Effort: Pulmonary effort is normal.     Breath sounds: Normal breath sounds.  Musculoskeletal:        General: Normal range of motion.     Cervical back: Normal range of motion and neck supple.  Skin:    General: Skin is warm.  Neurological:     General: No focal deficit present.     Mental Status: He is alert and oriented to person, place, and time.  Psychiatric:        Attention and Perception: Attention normal.        Mood and Affect: Mood normal.        Speech: Speech normal.        Behavior: Behavior normal. Behavior is cooperative.        Thought Content: Thought content normal.        Cognition and Memory: Cognition normal.        Judgment: Judgment normal.     Assessment   1. Essential hypertension   2. Stage 3 chronic kidney disease, unspecified whether stage 3a or 3b CKD   3. Other hyperlipidemia   4. IGT (impaired glucose tolerance)     Tests ordered Orders Placed This Encounter  Procedures  . CBC  . COMPLETE METABOLIC PANEL WITH GFR  . Hemoglobin A1c     Plan: Please see assessment and plan per problem list above.   Meds ordered this encounter  Medications  . hydrochlorothiazide (HYDRODIURIL) 25 MG tablet    Sig: Take 1 tablet (25 mg total) by mouth daily.    Dispense:  30 tablet    Refill:  2    Order Specific Question:   Supervising Provider    Answer:   Jacklynn Bue    Patient to follow-up in 05/04/2020   Perlie Mayo, NP

## 2020-02-16 NOTE — Patient Instructions (Addendum)
I appreciate the opportunity to provide you with care for your health and wellness. Today we discussed: blood pressure  Follow up: 10-12 months for BP check  Labs today No referrals today  Take care and enjoy the weather :)  Please continue to practice social distancing to keep you, your family, and our community safe.  If you must go out, please wear a mask and practice good handwashing.  It was a pleasure to see you and I look forward to continuing to work together on your health and well-being. Please do not hesitate to call the office if you need care or have questions about your care.  Have a wonderful day and week. With Gratitude, Cherly Beach, DNP, AGNP-BC

## 2020-02-17 ENCOUNTER — Ambulatory Visit: Payer: PPO | Admitting: Family Medicine

## 2020-02-17 DIAGNOSIS — S46011D Strain of muscle(s) and tendon(s) of the rotator cuff of right shoulder, subsequent encounter: Secondary | ICD-10-CM | POA: Diagnosis not present

## 2020-02-17 LAB — COMPLETE METABOLIC PANEL WITH GFR
AG Ratio: 2.1 (calc) (ref 1.0–2.5)
ALT: 19 U/L (ref 9–46)
AST: 23 U/L (ref 10–35)
Albumin: 4.8 g/dL (ref 3.6–5.1)
Alkaline phosphatase (APISO): 49 U/L (ref 35–144)
BUN/Creatinine Ratio: 16 (calc) (ref 6–22)
BUN: 27 mg/dL — ABNORMAL HIGH (ref 7–25)
CO2: 27 mmol/L (ref 20–32)
Calcium: 9.9 mg/dL (ref 8.6–10.3)
Chloride: 103 mmol/L (ref 98–110)
Creat: 1.74 mg/dL — ABNORMAL HIGH (ref 0.70–1.18)
GFR, Est African American: 43 mL/min/{1.73_m2} — ABNORMAL LOW (ref >=60)
GFR, Est Non African American: 37 mL/min/{1.73_m2} — ABNORMAL LOW (ref >=60)
Globulin: 2.3 g/dL (calc) (ref 1.9–3.7)
Glucose, Bld: 165 mg/dL — ABNORMAL HIGH (ref 65–99)
Potassium: 4.7 mmol/L (ref 3.5–5.3)
Sodium: 139 mmol/L (ref 135–146)
Total Bilirubin: 0.5 mg/dL (ref 0.2–1.2)
Total Protein: 7.1 g/dL (ref 6.1–8.1)

## 2020-02-17 LAB — HEMOGLOBIN A1C
Hgb A1c MFr Bld: 5.5 % of total Hgb (ref <5.7)
Mean Plasma Glucose: 111 (calc)
eAG (mmol/L): 6.2 (calc)

## 2020-02-17 LAB — CBC
HCT: 42.1 % (ref 38.5–50.0)
Hemoglobin: 13.8 g/dL (ref 13.2–17.1)
MCH: 29 pg (ref 27.0–33.0)
MCHC: 32.8 g/dL (ref 32.0–36.0)
MCV: 88.4 fL (ref 80.0–100.0)
MPV: 11.4 fL (ref 7.5–12.5)
Platelets: 193 10*3/uL (ref 140–400)
RBC: 4.76 10*6/uL (ref 4.20–5.80)
RDW: 13.9 % (ref 11.0–15.0)
WBC: 11.7 10*3/uL — ABNORMAL HIGH (ref 3.8–10.8)

## 2020-02-20 ENCOUNTER — Other Ambulatory Visit: Payer: Self-pay | Admitting: Family Medicine

## 2020-02-20 ENCOUNTER — Other Ambulatory Visit: Payer: Self-pay | Admitting: *Deleted

## 2020-02-20 DIAGNOSIS — I10 Essential (primary) hypertension: Secondary | ICD-10-CM

## 2020-02-24 DIAGNOSIS — S46011D Strain of muscle(s) and tendon(s) of the rotator cuff of right shoulder, subsequent encounter: Secondary | ICD-10-CM | POA: Diagnosis not present

## 2020-03-05 DIAGNOSIS — I10 Essential (primary) hypertension: Secondary | ICD-10-CM | POA: Diagnosis not present

## 2020-03-05 LAB — BASIC METABOLIC PANEL WITH GFR
BUN/Creatinine Ratio: 13 (calc) (ref 6–22)
BUN: 21 mg/dL (ref 7–25)
CO2: 32 mmol/L (ref 20–32)
Calcium: 9.4 mg/dL (ref 8.6–10.3)
Chloride: 103 mmol/L (ref 98–110)
Creat: 1.58 mg/dL — ABNORMAL HIGH (ref 0.70–1.18)
GFR, Est African American: 48 mL/min/{1.73_m2} — ABNORMAL LOW (ref >=60)
GFR, Est Non African American: 42 mL/min/{1.73_m2} — ABNORMAL LOW (ref >=60)
Glucose, Bld: 81 mg/dL (ref 65–139)
Potassium: 4.3 mmol/L (ref 3.5–5.3)
Sodium: 142 mmol/L (ref 135–146)

## 2020-03-08 ENCOUNTER — Other Ambulatory Visit: Payer: Self-pay | Admitting: Family Medicine

## 2020-03-20 ENCOUNTER — Other Ambulatory Visit: Payer: Self-pay | Admitting: Family Medicine

## 2020-03-20 DIAGNOSIS — I10 Essential (primary) hypertension: Secondary | ICD-10-CM

## 2020-03-27 ENCOUNTER — Other Ambulatory Visit: Payer: Self-pay | Admitting: Family Medicine

## 2020-03-27 DIAGNOSIS — I10 Essential (primary) hypertension: Secondary | ICD-10-CM

## 2020-05-04 ENCOUNTER — Encounter: Payer: Self-pay | Admitting: Family Medicine

## 2020-05-04 ENCOUNTER — Other Ambulatory Visit: Payer: Self-pay

## 2020-05-04 ENCOUNTER — Ambulatory Visit (INDEPENDENT_AMBULATORY_CARE_PROVIDER_SITE_OTHER): Payer: PPO | Admitting: Family Medicine

## 2020-05-04 VITALS — BP 122/78 | HR 69 | Temp 97.4°F | Resp 18 | Ht 70.0 in | Wt 223.0 lb

## 2020-05-04 DIAGNOSIS — E669 Obesity, unspecified: Secondary | ICD-10-CM

## 2020-05-04 DIAGNOSIS — I10 Essential (primary) hypertension: Secondary | ICD-10-CM

## 2020-05-04 DIAGNOSIS — R3912 Poor urinary stream: Secondary | ICD-10-CM | POA: Diagnosis not present

## 2020-05-04 DIAGNOSIS — N401 Enlarged prostate with lower urinary tract symptoms: Secondary | ICD-10-CM

## 2020-05-04 DIAGNOSIS — E7849 Other hyperlipidemia: Secondary | ICD-10-CM | POA: Diagnosis not present

## 2020-05-04 NOTE — Progress Notes (Signed)
Subjective:  Patient ID: [name] [name], male    DOB: [birth year]  Age: 79 y.o. MRN: [id number]  CC:  Chief Complaint  Patient presents with  . Follow-up    10-12 week follow up bp today was good       HPI  HPI   [name] is a 79 year old male patient of Dr. Griffin Dakin.  He presents today to follow-up on blood pressure.  Overall blood pressure is doing well.  Denies having any chest pain, headaches, dizziness, vision changes, leg swelling, palpitations.  Reports taking his medication as directed.  Denies having any sleep trouble.  Denies have any problems chewing swallowing or appetite changes.  Denies have any changes in bowel or bladder habits.  Denies having any blood in urine or stool.  Denies having any changes in memory.  Denies having any falls or injuries.  Denies having any skin issues or concerns.  Denies having any vision changes is wearing glasses.  Today patient denies signs and symptoms of COVID 19 infection including fever, chills, cough, shortness of breath, and headache. Past Medical, Surgical, Social History, Allergies, and Medications have been Reviewed.   Past Medical History:  Diagnosis Date  . Anemia, iron deficiency   . Annual physical exam 09/01/2016  . Cancer ([place])    skin cancers   . Educated about COVID-19 virus infection 03/13/2019  . ELECTROCARDIOGRAM, ABNORMAL 08/18/2010   Qualifier: Diagnosis of  By: Moshe Cipro MD, Joycelyn Schmid    . GERD (gastroesophageal reflux disease)   . H/O hiatal hernia   . Headache   . Hemorrhoids    and minimal polp  . History of kidney stones   . History of migraines childhood    Flared up in 2009, and have responded to depakote which was stopped in 9 months  . Hx of colonic polyps 02/24/2007  . Hyperlipidemia 2003  . Hypertension 2003  . Low sodium levels 02/20/2017  . LUNG NODULE 08/18/2010   Qualifier: Diagnosis of  By: Moshe Cipro MD, Margaret  No need for radiologic f/u unless new symptoms develop   . Lung nodules  since 2009   CT scan advised in 08/2010  . Neoplasm    dermal  . Nonspecific abnormal findings on radiological and other examination of skull and head 08/18/2010   Qualifier: Diagnosis of  By: Moshe Cipro MD, Arnell Asal of 08/2010 showed no change over a 2 yr period, no need for further scanning.Pt is a non smoker, will not rept unless symptomatic   . Overweight(278.02)     Current Meds  Medication Sig  . acetaminophen (TYLENOL) 325 MG tablet Take 325 mg by mouth as needed for mild pain.  Marland Kitchen aspirin (ASPIRIN LOW DOSE) 81 MG EC tablet Take 1 tablet (81 mg total) by mouth every morning.  . hydrochlorothiazide (HYDRODIURIL) 25 MG tablet Take 1 tablet (25 mg total) by mouth daily.  Javier Docker Oil 300 MG CAPS Take 1 capsule by mouth daily.  Marland Kitchen loratadine (CLARITIN) 10 MG tablet Take 10 mg by mouth daily.  . metoprolol succinate (TOPROL-XL) 50 MG 24 hr tablet TAKE 1 TABLET BY MOUTH ONCE DAILY WITH  OR  IMMEDIATELY  FOLLOWING  A  MEAL  . Misc Natural Products (BLACK CHERRY CONCENTRATE PO) Take 2 tablets by mouth daily.   . Multiple Vitamin (MULTIVITAMIN WITH MINERALS) TABS Take 1 tablet by mouth daily.  . potassium chloride (KLOR-CON) 10 MEQ tablet Take 10 mEq by mouth daily.  . potassium chloride (KLOR-CON) 10  MEQ tablet Take 1 tablet by mouth once daily  . predniSONE (DELTASONE) 10 MG tablet TAKE 4 TABLETS BY MOUTH ONCE DAILY FOR 2 DAYS THEN 3 TABS ONCE DAILY FOR 2 DAYS 2 TABS ONCE DAILY FOR 2 DAYS 1 TAB ONCE DAILY FOR 2 DAYS.  Marland Kitchen simvastatin (ZOCOR) 40 MG tablet TAKE 1 TABLET BY MOUTH AT BEDTIME  . terazosin (HYTRIN) 1 MG capsule Take 1 capsule by mouth twice daily  . terazosin (HYTRIN) 2 MG capsule Take 1 capsule by mouth at bedtime    ROS:  Review of Systems  Constitutional: Negative.   HENT: Negative.   Eyes: Negative.   Respiratory: Negative.   Cardiovascular: Negative.   Gastrointestinal: Negative.   Genitourinary: Negative.   Musculoskeletal: Negative.   Skin: Negative.     Neurological: Negative.   Endo/Heme/Allergies: Negative.   Psychiatric/Behavioral: Negative.   All other systems reviewed and are negative.    Objective:   Today's Vitals: BP 122/78 (BP Location: Right Arm, Patient Position: Sitting, Cuff Size: Normal)   Pulse 69   Temp (!) 97.4 F (36.3 C) (Temporal)   Resp 18   Ht 5\' 10"  (1.778 m)   Wt 223 lb (101.2 kg)   SpO2 93%   BMI 32.00 kg/m  Vitals with BMI 05/06/2020 05/04/2020 02/16/2020  Height 5\' 10"  5\' 10"  5\' 10"   Weight 222 lbs 11 oz 223 lbs 223 lbs  BMI 72.53 32 32  Systolic 664 403 474  Diastolic 90 78 82  Pulse 65 69 69     Physical Exam Vitals and nursing note reviewed.  Constitutional:      Appearance: Normal appearance. He is well-developed and well-groomed. He is obese.  HENT:     Head: Normocephalic and atraumatic.     Right Ear: External ear normal.     Left Ear: External ear normal.     Mouth/Throat:     Comments: Mask in place Eyes:     General:        Right eye: No discharge.        Left eye: No discharge.     Conjunctiva/sclera: Conjunctivae normal.  Cardiovascular:     Rate and Rhythm: Normal rate and regular rhythm.     Pulses: Normal pulses.     Heart sounds: Normal heart sounds.  Pulmonary:     Effort: Pulmonary effort is normal.     Breath sounds: Normal breath sounds.  Musculoskeletal:        General: Normal range of motion.     Cervical back: Normal range of motion and neck supple.  Skin:    General: Skin is warm.  Neurological:     General: No focal deficit present.     Mental Status: He is alert and oriented to person, place, and time.  Psychiatric:        Attention and Perception: Attention normal.        Mood and Affect: Mood normal.        Speech: Speech normal.        Behavior: Behavior normal. Behavior is cooperative.        Thought Content: Thought content normal.        Cognition and Memory: Cognition normal.        Judgment: Judgment normal.      Assessment   1.  Obesity (BMI 30.0-34.9)   2. Essential hypertension   3. Other hyperlipidemia   4. Benign prostatic hyperplasia with weak urinary stream     Tests  ordered Orders Placed This Encounter  Procedures  . CBC  . COMPLETE METABOLIC PANEL WITH GFR  . Lipid panel  . PSA     Plan: Please see assessment and plan per problem list above.   No orders of the defined types were placed in this encounter.   Patient to follow-up in Dec Annual [place], NP

## 2020-05-04 NOTE — Patient Instructions (Signed)
I appreciate the opportunity to provide you with care for your health and wellness. Today we discussed: BP   Follow up: Dec Annual (12/16-after)  Labs-Fasting 3 days before No referrals today  HAVE A GREAT SUMMER AND FALL!  Please continue to practice social distancing to keep you, your family, and our community safe.  If you must go out, please wear a mask and practice good handwashing.  It was a pleasure to see you and I look forward to continuing to work together on your health and well-being. Please do not hesitate to call the office if you need care or have questions about your care.  Have a wonderful day and week. With Gratitude, Cherly Beach, DNP, AGNP-BC

## 2020-05-06 ENCOUNTER — Encounter (HOSPITAL_COMMUNITY): Payer: Self-pay

## 2020-05-06 ENCOUNTER — Emergency Department (HOSPITAL_COMMUNITY)
Admission: EM | Admit: 2020-05-06 | Discharge: 2020-05-06 | Disposition: A | Discharge status: Home / Self Care | Payer: PPO | Attending: Emergency Medicine | Admitting: Emergency Medicine

## 2020-05-06 ENCOUNTER — Encounter: Payer: Self-pay | Admitting: Family Medicine

## 2020-05-06 ENCOUNTER — Other Ambulatory Visit: Payer: Self-pay

## 2020-05-06 ENCOUNTER — Emergency Department (HOSPITAL_COMMUNITY): Payer: PPO

## 2020-05-06 DIAGNOSIS — I129 Hypertensive chronic kidney disease with stage 1 through stage 4 chronic kidney disease, or unspecified chronic kidney disease: Secondary | ICD-10-CM | POA: Diagnosis not present

## 2020-05-06 DIAGNOSIS — Z87891 Personal history of nicotine dependence: Secondary | ICD-10-CM | POA: Insufficient documentation

## 2020-05-06 DIAGNOSIS — J90 Pleural effusion, not elsewhere classified: Secondary | ICD-10-CM | POA: Diagnosis not present

## 2020-05-06 DIAGNOSIS — Z7982 Long term (current) use of aspirin: Secondary | ICD-10-CM | POA: Diagnosis not present

## 2020-05-06 DIAGNOSIS — N183 Chronic kidney disease, stage 3 unspecified: Secondary | ICD-10-CM | POA: Insufficient documentation

## 2020-05-06 DIAGNOSIS — Z79899 Other long term (current) drug therapy: Secondary | ICD-10-CM | POA: Diagnosis not present

## 2020-05-06 DIAGNOSIS — R0902 Hypoxemia: Secondary | ICD-10-CM | POA: Diagnosis not present

## 2020-05-06 DIAGNOSIS — Z85828 Personal history of other malignant neoplasm of skin: Secondary | ICD-10-CM | POA: Insufficient documentation

## 2020-05-06 DIAGNOSIS — R0789 Other chest pain: Secondary | ICD-10-CM | POA: Diagnosis not present

## 2020-05-06 DIAGNOSIS — J9811 Atelectasis: Secondary | ICD-10-CM | POA: Diagnosis not present

## 2020-05-06 DIAGNOSIS — I10 Essential (primary) hypertension: Secondary | ICD-10-CM | POA: Diagnosis not present

## 2020-05-06 DIAGNOSIS — R079 Chest pain, unspecified: Secondary | ICD-10-CM | POA: Insufficient documentation

## 2020-05-06 LAB — CBC WITH DIFFERENTIAL/PLATELET
Abs Immature Granulocytes: 0.03 10*3/uL (ref 0.00–0.07)
Basophils Absolute: 0.1 10*3/uL (ref 0.0–0.1)
Basophils Relative: 1 %
Eosinophils Absolute: 0.2 10*3/uL (ref 0.0–0.5)
Eosinophils Relative: 4 %
HCT: 42.6 % (ref 39.0–52.0)
Hemoglobin: 13.8 g/dL (ref 13.0–17.0)
Immature Granulocytes: 1 %
Lymphocytes Relative: 24 %
Lymphs Abs: 1.3 10*3/uL (ref 0.7–4.0)
MCH: 29.7 pg (ref 26.0–34.0)
MCHC: 32.4 g/dL (ref 30.0–36.0)
MCV: 91.6 fL (ref 80.0–100.0)
Monocytes Absolute: 0.7 10*3/uL (ref 0.1–1.0)
Monocytes Relative: 13 %
Neutro Abs: 3.2 10*3/uL (ref 1.7–7.7)
Neutrophils Relative %: 57 %
Platelets: 182 10*3/uL (ref 150–400)
RBC: 4.65 MIL/uL (ref 4.22–5.81)
RDW: 14.3 % (ref 11.5–15.5)
WBC: 5.5 10*3/uL (ref 4.0–10.5)
nRBC: 0 % (ref 0.0–0.2)

## 2020-05-06 LAB — BASIC METABOLIC PANEL
Anion gap: 9 (ref 5–15)
BUN: 20 mg/dL (ref 8–23)
CO2: 26 mmol/L (ref 22–32)
Calcium: 9.4 mg/dL (ref 8.9–10.3)
Chloride: 105 mmol/L (ref 98–111)
Creatinine, Ser: 1.78 mg/dL — ABNORMAL HIGH (ref 0.61–1.24)
GFR calc Af Amer: 42 mL/min — ABNORMAL LOW (ref >60)
GFR calc non Af Amer: 36 mL/min — ABNORMAL LOW (ref >60)
Glucose, Bld: 86 mg/dL (ref 70–99)
Potassium: 4 mmol/L (ref 3.5–5.1)
Sodium: 140 mmol/L (ref 135–145)

## 2020-05-06 LAB — TROPONIN I (HIGH SENSITIVITY)
Troponin I (High Sensitivity): 5 ng/L (ref <18)
Troponin I (High Sensitivity): 4 ng/L (ref <18)

## 2020-05-06 NOTE — Assessment & Plan Note (Signed)
Encouraged low fat diet and exercise, continue medication as ordered

## 2020-05-06 NOTE — Assessment & Plan Note (Signed)
Stable, continue Hytrin

## 2020-05-06 NOTE — Discharge Instructions (Addendum)
Received in the emergency department today for an episode of chest pain that occurred at church.  Work-up in the emergency department was overall reassuring.  Your EKG and blood work did not show signs of your having an active heart attack.  However, as I explained, your work-up in the ER does not exclude the possibility that you have significant coronary artery disease.  You need more of a cardiac workup, including a stress test.  We talked about admission to the hospital versus close outpatient follow-up for stress test.  You told me you would strongly prefer to go home.  I felt this was reasonable since you were pain-free, and our cardiologist Dr Martinique also felt comfortable with this plan.  If you have recurring chest pain, lightheadedness, or sudden difficulty breathing, or any other concerning symptoms, please call 911 or return immediately to the ER.

## 2020-05-06 NOTE — Assessment & Plan Note (Signed)
[name] [name] is encouraged to maintain a well balanced diet that is low in salt. Controlled, continue current medication regimen.   Additionally, [name] [name] is also reminded that exercise is beneficial for heart health and control of  Blood pressure. 30-60 minutes daily is recommended-walking was suggested.

## 2020-05-06 NOTE — ED Provider Notes (Signed)
4:05 PM-checkout from Dr. Maren Beach to evaluate troponin after returns, consider discharge if unchanged delta troponin.  Clinical Course as of May 06 1630  Sun May 06, 2020  1410 Troponin I (High Sensitivity): 5 [MT]  1410 Consult to cards, will need 2nd trop   [MT]  1430 I spoke to Dr Martinique from cardiology who feels if the patient remains pain free and delta trop is negative, it would be appropriate for either obs admission and stress test or rapid outpt follow up, can likely get stress test done within the week.  I'll check 2nd trop and discuss with patient.   [MT]  1528 Troponin I (High Sensitivity): 5 [MT]  1627 Unchanged delta troponin  Troponin I (High Sensitivity) [EW]    Clinical Course User Index [EW] Daleen Bo, MD [MT] Langston Masker Carola Rhine, MD    4:25 PM-vital signs stable, normal.  At this time, he is pain-free.  He states he presented for evaluation of chest pain that lasted 2 to 3 minutes while sitting in church.  He has not had a recurrence.  He has no other complaints or symptoms at this time.  He wants to follow-up with a cardiologist here in [place].  He will be given name of the local cardiologist.   Daleen Bo, MD 05/06/20 [phone]

## 2020-05-06 NOTE — Assessment & Plan Note (Signed)
   [name] [name] is re-educated about the importance of exercise daily to help with weight management. A minumum of 30 minutes daily is recommended. Additionally, importance of healthy food choices  with portion control discussed.   Wt Readings from Last 3 Encounters:  05/06/20 222 lb 10.6 oz (101 kg)  05/04/20 223 lb (101.2 kg)  02/16/20 223 lb (101.2 kg)

## 2020-05-06 NOTE — ED Triage Notes (Signed)
Pt reports sitting in church and had sudden onset of left sided chest pressure.  Reports became diaphoretic.  Denies any n/v.  Reports pain lasted a "couple of min."  EMS arrived, 12 lead NSR and vss.  Denies any weakness.  Reports has had several episodes of chest pressure over the past month or two but not as severe as todays episode.  PT took 4baby asa pta around 1215.

## 2020-05-06 NOTE — ED Provider Notes (Signed)
[facility] Provider Note   CSN: [id number] Arrival date & time: 05/06/20  1248     History CC: Chest pain  [name] [name] is a 79 y.o. male with history of hypertension, hyperlipidemia, hiatal hernia, presented to emergency department left-sided chest pain.  Patient reports that he was in church earlier this morning, around noon began having pressure in the left side of his chest.  It began in the shoulder and then spread down the lower left side of his chest.  He said it felt like he was straining a muscle or pulling something.  He describes lightheadedness associated with that.  His church Cclled 911.  He gave himself 324 mg of aspirin prior to their arrival.  In route to the hospital his pain completely abated.  He is now symptom free  He reports to me that he said similar symptoms on and off for the past 2 months.  These are not associated with exertion.  He will occasionally get this feeling like "I pulled something in my shoulder" both at rest and with exertion.  He denies any history of MI or cardiac disease that he is aware of.  He has never seen a cardiologist.  He denies any recent stress test.  He denies history of diabetes.  He denies smoking history.  He denies any significant family history of cardiac disease.  He received both covid vaccines  HPI     Past Medical History:  Diagnosis Date  . Anemia, iron deficiency   . Annual physical exam 09/01/2016  . Cancer ([place])    skin cancers   . Educated about COVID-19 virus infection 03/13/2019  . ELECTROCARDIOGRAM, ABNORMAL 08/18/2010   Qualifier: Diagnosis of  By: Moshe Cipro MD, Joycelyn Schmid    . GERD (gastroesophageal reflux disease)   . H/O hiatal hernia   . Headache   . Hemorrhoids    and minimal polp  . History of kidney stones   . History of migraines childhood    Flared up in 2009, and have responded to depakote which was stopped in 9 months  . Hx of colonic polyps 02/24/2007  . Hyperlipidemia 2003    . Hypertension 2003  . Low sodium levels 02/20/2017  . LUNG NODULE 08/18/2010   Qualifier: Diagnosis of  By: Moshe Cipro MD, Margaret  No need for radiologic f/u unless new symptoms develop   . Lung nodules since 2009   CT scan advised in 08/2010  . Neoplasm    dermal  . Nonspecific abnormal findings on radiological and other examination of skull and head 08/18/2010   Qualifier: Diagnosis of  By: Moshe Cipro MD, Arnell Asal of 08/2010 showed no change over a 2 yr period, no need for further scanning.Pt is a non smoker, will not rept unless symptomatic   . Overweight(278.02)     Patient Active Problem List   Diagnosis Date Noted  . CKD (chronic kidney disease) stage 3, GFR 30-59 ml/min 03/13/2019  . BPH (benign prostatic hyperplasia) 11/26/2016  . Hyperlipidemia 11/26/2016  . IGT (impaired glucose tolerance) 10/01/2014  . Obesity (BMI 30.0-34.9) 12/15/2010  . Essential hypertension 07/23/2010    Past Surgical History:  Procedure Laterality Date  . COLONOSCOPY  2008   Fields-mod IH, 89mm polyp sigmoid colon (disappeared w/ insufflation & could not be retrieved)  . COLONOSCOPY  05/19/2012   Procedure: COLONOSCOPY;  Surgeon: Danie Binder, MD;  Location: [facility];  Service: Endoscopy;  Laterality: N/A;  8:30  . CYSTOSCOPY W/  URETERAL STENT PLACEMENT Left 11/27/2016   Procedure: CYSTOSCOPY WITH RETROGRADE PYELOGRAM/URETERAL STENT PLACEMENT;  Surgeon: Bjorn Loser, MD;  Location: [facility];  Service: Urology;  Laterality: Left;  . ESOPHAGOGASTRODUODENOSCOPY  01/05/2007   Fields-sm HH, benign SB biopsy  . EXTRACORPOREAL SHOCK WAVE LITHOTRIPSY Left 12/08/2016   Procedure: LEFT EXTRACORPOREAL SHOCK WAVE LITHOTRIPSY (ESWL);  Surgeon: Festus Aloe, MD;  Location: [facility];  Service: Urology;  Laterality: Left;  . GIVENS CAPSULE STUDY  01/20/2007   Fields-nothing to explain IDA, lymphectasias  . removal of skin cancer twice         Family History  Problem Relation Age of Onset  . Heart  defect Mother        pacemaker   . Alzheimer's disease Mother 38       2011  . Heart defect Brother        bradycardia   . Heart failure Father   . Asthma Father   . Hypertension Daughter   . Kidney Stones Daughter   . Colon cancer Neg Hx     Social History   Tobacco Use  . Smoking status: Former Smoker    Packs/day: 2.00    Years: 15.00    Pack years: 30.00    Types: Cigarettes    Quit date: 10/20/1978    Years since quitting: 41.5  . Smokeless tobacco: Former Systems developer    Types: [place] date: 10/20/2004  Substance Use Topics  . Alcohol use: No  . Drug use: No    Home Medications Prior to Admission medications   Medication Sig Start Date End Date Taking? Authorizing Provider  acetaminophen (TYLENOL) 325 MG tablet Take 325 mg by mouth as needed for mild pain.   Yes [provider]  aspirin (ASPIRIN LOW DOSE) 81 MG EC tablet Take 1 tablet (81 mg total) by mouth every morning. 12/10/16  Yes Festus Aloe, MD  hydrochlorothiazide (HYDRODIURIL) 25 MG tablet Take 1 tablet (25 mg total) by mouth daily. 02/16/20  Yes Perlie Mayo, NP  Krill Oil 300 MG CAPS Take 1 capsule by mouth at bedtime.    Yes [provider]  loratadine (CLARITIN) 10 MG tablet Take 10 mg by mouth daily.   Yes [provider]  metoprolol succinate (TOPROL-XL) 50 MG 24 hr tablet TAKE 1 TABLET BY MOUTH ONCE DAILY WITH  OR  IMMEDIATELY  FOLLOWING  A  MEAL 03/27/20  Yes Fayrene Helper, MD  Misc Natural Products (BLACK CHERRY CONCENTRATE PO) Take 2 tablets by mouth daily.    Yes [provider]  Multiple Vitamin (MULTIVITAMIN WITH MINERALS) TABS Take 1 tablet by mouth daily.   Yes [provider]  potassium chloride (KLOR-CON) 10 MEQ tablet Take 1 tablet by mouth once daily 03/27/20  Yes Fayrene Helper, MD  simvastatin (ZOCOR) 40 MG tablet TAKE 1 TABLET BY MOUTH AT BEDTIME 03/08/20  Yes Perlie Mayo, NP  terazosin (HYTRIN) 1 MG capsule Take 1 capsule by mouth  twice daily 02/20/20  Yes Fayrene Helper, MD  terazosin (HYTRIN) 2 MG capsule Take 1 capsule by mouth at bedtime 12/30/19  Yes Perlie Mayo, NP    Allergies    Ace inhibitors  Review of Systems   Review of Systems  Constitutional: Negative for chills and fever.  HENT: Negative for ear pain and sore throat.   Eyes: Negative for pain and visual disturbance.  Respiratory: Negative for cough and shortness of breath.   Cardiovascular: Positive  for chest pain. Negative for palpitations.  Gastrointestinal: Negative for abdominal pain and vomiting.  Genitourinary: Negative for dysuria and hematuria.  Musculoskeletal: Negative for arthralgias and back pain.  Skin: Negative for color change and rash.  Neurological: Positive for light-headedness. Negative for syncope.  Psychiatric/Behavioral: Negative for agitation and confusion.  All other systems reviewed and are negative.   Physical Exam Updated Vital Signs BP 138/74   Pulse (!) 58   Temp 97.7 F (36.5 C) (Oral)   Resp 14   Ht 5\' 10"  (1.778 m)   Wt 101 kg   SpO2 100%   BMI 31.95 kg/m   Physical Exam Vitals and nursing note reviewed.  Constitutional:      Appearance: He is well-developed.  HENT:     Head: Normocephalic and atraumatic.  Eyes:     Conjunctiva/sclera: Conjunctivae normal.  Cardiovascular:     Rate and Rhythm: Normal rate and regular rhythm.     Heart sounds: Normal heart sounds.  Pulmonary:     Effort: Pulmonary effort is normal. No respiratory distress.     Breath sounds: Normal breath sounds.  Abdominal:     Palpations: Abdomen is soft.     Tenderness: There is no abdominal tenderness.  Musculoskeletal:     Cervical back: Neck supple.  Skin:    General: Skin is warm and dry.  Neurological:     General: No focal deficit present.     Mental Status: He is alert and oriented to person, place, and time.     Cranial Nerves: No cranial nerve deficit.     Motor: No weakness.  Psychiatric:        Mood  and Affect: Mood normal.        Behavior: Behavior normal.     ED Results / Procedures / Treatments   Labs (all labs ordered are listed, but only abnormal results are displayed) Labs Reviewed  BASIC METABOLIC PANEL - Abnormal; Notable for the following components:      Result Value   Creatinine, Ser 1.78 (*)    GFR calc non Af Amer 36 (*)    GFR calc Af Amer 42 (*)    All other components within normal limits  CBC WITH DIFFERENTIAL/PLATELET  TROPONIN I (HIGH SENSITIVITY)  TROPONIN I (HIGH SENSITIVITY)    EKG EKG Interpretation  Date/Time:  Sunday May 06 2020 12:55:33 EDT Ventricular Rate:  66 PR Interval:    QRS Duration: 109 QT Interval:  384 QTC Calculation: 403 R Axis:   24 Text Interpretation: Sinus rhythm No STEMI Confirmed by Octaviano Glow [phone]) on 05/06/2020 1:33:12 PM   Radiology DG Chest 2 View  Result Date: 05/06/2020 CLINICAL DATA:  Chest pain. EXAM: CHEST - 2 VIEW COMPARISON:  July 22, 2012. FINDINGS: The heart size and mediastinal contours are within normal limits. No pneumothorax or pleural effusion is noted. Minimal bibasilar subsegmental atelectasis is noted. The visualized skeletal structures are unremarkable. IMPRESSION: Minimal bibasilar subsegmental atelectasis. Electronically Signed   By: Marijo Conception M.D.   On: 05/06/2020 14:22    Procedures Procedures (including critical care time)  Medications Ordered in ED Medications - No data to display  ED Course  I have reviewed the triage vital signs and the nursing notes.  Pertinent labs & imaging results that were available during my care of the patient were reviewed by me and considered in my medical decision making (see chart for details).  79 yo male presenting to ED with episodic chest pain  at church, while at rest.  Pain now resolved in ED.  Asymptomatic and well appearing on exam with benign cardiopulmonary exam.  I reviewed his labs showing trop 5->4.  BMP with some kidney disease Cr  1.78 which is chronic based on his priors.  CBC unremarkable.  DG chest shows no focal findings, suggests some atelactasis in the bases of the lungs.  EKG per my interpretation shows NSR without ischemic findings, and telemetry review shows NSR here in the ED.  Without fevers, hypoxia, or cough I am doubtful of PNA Doubtful of aortic dissection or PE with normal exam and resolution of his symptoms  HEART score of 4.  Overall, his story was concerning to me for angina, and I spoke to Dr. Martinique from cardiology as noted below.  I engaged in shared medical decision making with the patient and his wife, who preferred to return home today and follow up with cardiology this week.  They understand the concern for cardiac etiology and have a low threshold to return to the ED if he has recurrence of his symptoms.  He'll continue taking 81 mg aspirin daily as he has before.   Clinical Course as of May 07 1827  Sun May 06, 2020  1410 Troponin I (High Sensitivity): 5 [MT]  1410 Consult to cards, will need 2nd trop   [MT]  1430 I spoke to Dr Martinique from cardiology who feels if the patient remains pain free and delta trop is negative, it would be appropriate for either obs admission and stress test or rapid outpt follow up, can likely get stress test done within the week.  I'll check 2nd trop and discuss with patient.   [MT]  1528 Troponin I (High Sensitivity): 5 [MT]  1627 Unchanged delta troponin  Troponin I (High Sensitivity) [EW]    Clinical Course User Index [EW] Daleen Bo, MD [MT] Langston Masker Carola Rhine, MD    Final Clinical Impression(s) / ED Diagnoses Final diagnoses:  Chest pain, unspecified type    Rx / DC Orders ED Discharge Orders    None       Wyvonnia Dusky, MD 05/06/20 [phone]

## 2020-05-13 NOTE — Progress Notes (Signed)
Cardiology Office Note  Date: 05/14/2020   ID: [name] [name], DOB [birth year], MRN [id number]  PCP:  Fayrene Helper, MD  Cardiologist:  No primary care provider on file. Electrophysiologist:  None   Chief Complaint: F/U ED visit CP  History of Present Illness: [name] [name] is a 79 y.o. male with a history of CP, HTN, HLD CKD stage III, impaired glucose tolerance, BPH. Remotely saw Dr [name] [name] in 2011 for abnormal EKG. EKG showed borderline 1st degree AVB and RBBB pattern  Patient presented to [facility] emergency department on 05/06/2020 with complaints of left-sided chest pain.  He stated he had been in church earlier that day around noon and began having pressure in the left side of his chest.  Pain began in shoulder and spread down the lower left side of his chest.  Described as straining a muscle or pulling something.  Described some lightheadedness associated.  Took 324 mg of aspirin prior to arrival and pain resolved.  He had explained to the emergency room provider he had similar symptoms on and off for the previous 2 months not associated with exertion.  Troponins were negative x2.  BMP showed some kidney disease with creatinine of 1.78.  CBC was unremarkable chest x-ray showed no focal findings suggest some atelectasis in lung bases.  EKG with no ischemic changes.  He was discharged.  Patient is here for follow-up status post recent emergency room visit for chest pain.  States he has had no subsequent episodes since the emergency room visit.  He also complains of recent increase in dyspnea on exertion.  He states he has noticed over the last year that he has become progressively short of breath when performing exertional activity.  States it usually takes him a while to recover from this shortness of breath after exertion.  He denies any recent weight gain, lower extremity edema, palpitations or arrhythmias, CVA or TIA-like symptoms, bleeding, no syncopal or near syncopal  episodes.  States he does have chronic motion sickness issues.   Past Medical History:  Diagnosis Date  . Anemia, iron deficiency   . Annual physical exam 09/01/2016  . Cancer ([place])    skin cancers   . Educated about COVID-19 virus infection 03/13/2019  . ELECTROCARDIOGRAM, ABNORMAL 08/18/2010   Qualifier: Diagnosis of  By: Moshe Cipro MD, Joycelyn Schmid    . GERD (gastroesophageal reflux disease)   . H/O hiatal hernia   . Headache   . Hemorrhoids    and minimal polp  . History of kidney stones   . History of migraines childhood    Flared up in 2009, and have responded to depakote which was stopped in 9 months  . Hx of colonic polyps 02/24/2007  . Hyperlipidemia 2003  . Hypertension 2003  . Low sodium levels 02/20/2017  . LUNG NODULE 08/18/2010   Qualifier: Diagnosis of  By: Moshe Cipro MD, Margaret  No need for radiologic f/u unless new symptoms develop   . Lung nodules since 2009   CT scan advised in 08/2010  . Neoplasm    dermal  . Nonspecific abnormal findings on radiological and other examination of skull and head 08/18/2010   Qualifier: Diagnosis of  By: Moshe Cipro MD, Arnell Asal of 08/2010 showed no change over a 2 yr period, no need for further scanning.Pt is a non smoker, will not rept unless symptomatic   . Overweight(278.02)     Past Surgical History:  Procedure Laterality Date  . COLONOSCOPY  2008  Fields-mod IH, 51mm polyp sigmoid colon (disappeared w/ insufflation & could not be retrieved)  . COLONOSCOPY  05/19/2012   Procedure: COLONOSCOPY;  Surgeon: [name] Binder, MD;  Location: [facility];  Service: Endoscopy;  Laterality: N/A;  8:30  . CYSTOSCOPY W/ URETERAL STENT PLACEMENT Left 11/27/2016   Procedure: CYSTOSCOPY WITH RETROGRADE PYELOGRAM/URETERAL STENT PLACEMENT;  Surgeon: Bjorn Loser, MD;  Location: [facility];  Service: Urology;  Laterality: Left;  . ESOPHAGOGASTRODUODENOSCOPY  01/05/2007   Fields-sm HH, benign SB biopsy  . EXTRACORPOREAL SHOCK WAVE LITHOTRIPSY Left  12/08/2016   Procedure: LEFT EXTRACORPOREAL SHOCK WAVE LITHOTRIPSY (ESWL);  Surgeon: Festus Aloe, MD;  Location: [facility];  Service: Urology;  Laterality: Left;  . GIVENS CAPSULE STUDY  01/20/2007   Fields-nothing to explain IDA, lymphectasias  . removal of skin cancer twice      Current Outpatient Medications  Medication Sig Dispense Refill  . acetaminophen (TYLENOL) 325 MG tablet Take 325 mg by mouth as needed for mild pain.    Marland Kitchen aspirin (ASPIRIN LOW DOSE) 81 MG EC tablet Take 1 tablet (81 mg total) by mouth every morning. 30 tablet 12  . hydrochlorothiazide (HYDRODIURIL) 25 MG tablet Take 1 tablet (25 mg total) by mouth daily. 30 tablet 2  . Krill Oil 300 MG CAPS Take 1 capsule by mouth at bedtime.     Marland Kitchen loratadine (CLARITIN) 10 MG tablet Take 10 mg by mouth daily.    . metoprolol succinate (TOPROL-XL) 50 MG 24 hr tablet TAKE 1 TABLET BY MOUTH ONCE DAILY WITH  OR  IMMEDIATELY  FOLLOWING  A  MEAL 90 tablet 0  . Misc Natural Products (BLACK CHERRY CONCENTRATE PO) Take 2 tablets by mouth daily.     . Multiple Vitamin (MULTIVITAMIN WITH MINERALS) TABS Take 1 tablet by mouth daily.    . potassium chloride (KLOR-CON) 10 MEQ tablet Take 1 tablet by mouth once daily 90 tablet 0  . simvastatin (ZOCOR) 40 MG tablet TAKE 1 TABLET BY MOUTH AT BEDTIME 90 tablet 1  . terazosin (HYTRIN) 1 MG capsule Take 1 capsule by mouth twice daily 180 capsule 0  . terazosin (HYTRIN) 2 MG capsule Take 1 capsule by mouth at bedtime 90 capsule 0   No current facility-administered medications for this visit.   Allergies:  Ace inhibitors   Social History: The patient  reports that he quit smoking about 41 years ago. His smoking use included cigarettes. He has a 30.00 pack-year smoking history. He quit smokeless tobacco use about 15 years ago.  His smokeless tobacco use included chew. He reports that he does not drink alcohol and does not use drugs.   Family History: The patient's family history includes Alzheimer's  disease (age of onset: 41) in his mother; Asthma in his father; Heart defect in his brother and mother; Heart failure in his father; Hypertension in his daughter; Kidney Stones in his daughter.   ROS:  Please see the history of present illness. Otherwise, complete review of systems is positive for none.  All other systems are reviewed and negative.   Physical Exam: VS:  BP 128/78   Pulse 77   Ht 5\' 10"  (1.778 m)   Wt (!) 220 lb (99.8 kg)   SpO2 95%   BMI 31.57 kg/m , BMI Body mass index is 31.57 kg/m.  Wt Readings from Last 3 Encounters:  05/14/20 (!) 220 lb (99.8 kg)  05/06/20 222 lb 10.6 oz (101 kg)  05/04/20 223 lb (101.2 kg)  General: Patient appears comfortable at rest. Neck: Supple, no elevated JVP or carotid bruits, no thyromegaly. Lungs: Clear to auscultation, nonlabored breathing at rest. Cardiac: Regular rate and rhythm, no S3 or significant systolic murmur, no pericardial rub. Extremities: No pitting edema, distal pulses 2+. Skin: Warm and dry. Musculoskeletal: No kyphosis. Neuropsychiatric: Alert and oriented x3, affect grossly appropriate.  ECG:  EKG May 06, 2020 sinus rhythm rate of 66  Recent Labwork: 09/28/2019: TSH 3.07 02/16/2020: ALT 19; AST 23 05/06/2020: BUN 20; Creatinine, Ser 1.78; Hemoglobin 13.8; Platelets 182; Potassium 4.0; Sodium 140     Component Value Date/Time   CHOL 192 09/28/2019 1027   TRIG 138 09/28/2019 1027   HDL 51 09/28/2019 1027   CHOLHDL 3.8 09/28/2019 1027   VLDL 23 02/17/2017 0803   LDLCALC 115 (H) 09/28/2019 1027    Other Studies Reviewed Today:   Assessment and Plan:  1. Chest pain of uncertain etiology   2. Mixed hyperlipidemia   3. Essential hypertension   4. Stage 3b chronic kidney disease   5. DOE (dyspnea on exertion)    1. Chest pain of uncertain etiology Recent presentation to emergency department [facility] complaining of chest pain.  Work-up was negative for ischemia.  Patient stated he has had a few  episodes over the past couple of months when speaking with ER provider at recent visit.  Please get a nuclear Cardiolite Myoview treadmill stress test.  Continue aspirin 81 mg.  2. Mixed hyperlipidemia Lipid panel 09/28/2019 showed total cholesterol 192, HDL 51, triglycerides 138, LDL 115.  Continue simvastatin 40 mg daily.  3. Essential hypertension Patient is normotensive today with a blood pressure of 128/78.  Continue HCTZ 25 mg daily.  Toprol-XL 50 mg p.o. daily.  4. Stage 3b chronic kidney disease Recent creatinine of 1.78 with GFR of 36 on 05/06/2020.  He follows with PCP for this issue.  He has not been referred to nephrology.  5.  DOE Patient states he has noticed an increase in dyspnea on exertion progressing over the last year.  Please get an echocardiogram.  Medication Adjustments/Labs and Tests Ordered: Current medicines are reviewed at length with the patient today.  Concerns regarding medicines are outlined above.   Disposition: Follow-up with [place] MD or [facility] 4 to 6 weeks.  Signed, Levell July, NP 05/14/2020 8:32 AM    [facility], [place], [place] [address] Phone: [phone]; Fax: [phone]

## 2020-05-14 ENCOUNTER — Other Ambulatory Visit: Payer: Self-pay

## 2020-05-14 ENCOUNTER — Ambulatory Visit: Payer: PPO | Admitting: Family Medicine

## 2020-05-14 ENCOUNTER — Encounter: Payer: Self-pay | Admitting: Family Medicine

## 2020-05-14 ENCOUNTER — Encounter: Payer: Self-pay | Admitting: *Deleted

## 2020-05-14 VITALS — BP 128/78 | HR 77 | Ht 70.0 in | Wt 220.0 lb

## 2020-05-14 DIAGNOSIS — E782 Mixed hyperlipidemia: Secondary | ICD-10-CM | POA: Diagnosis not present

## 2020-05-14 DIAGNOSIS — I209 Angina pectoris, unspecified: Secondary | ICD-10-CM | POA: Insufficient documentation

## 2020-05-14 DIAGNOSIS — N1832 Chronic kidney disease, stage 3b: Secondary | ICD-10-CM | POA: Diagnosis not present

## 2020-05-14 DIAGNOSIS — R079 Chest pain, unspecified: Secondary | ICD-10-CM | POA: Diagnosis not present

## 2020-05-14 DIAGNOSIS — R0609 Other forms of dyspnea: Secondary | ICD-10-CM

## 2020-05-14 DIAGNOSIS — R0600 Dyspnea, unspecified: Secondary | ICD-10-CM | POA: Diagnosis not present

## 2020-05-14 DIAGNOSIS — I10 Essential (primary) hypertension: Secondary | ICD-10-CM

## 2020-05-14 NOTE — Patient Instructions (Addendum)
Medication Instructions:   Your physician recommends that you continue on your current medications as directed. Please refer to the Current Medication list given to you today. *If you need a refill on your cardiac medications before your next appointment, please call your pharmacy*   Lab Work:  NONE If you have labs (blood work) drawn today and your tests are completely normal, you will receive your results only by: Marland Kitchen MyChart Message (if you have MyChart) OR . A paper copy in the mail If you have any lab test that is abnormal or we need to change your treatment, we will call you to review the results.   Testing/Procedures:  Exercise Myoview  Echocardiogram   Follow-Up: At [facility], you and your health needs are our priority.  As part of our continuing mission to provide you with exceptional heart care, we have created designated Provider Care Teams.  These Care Teams include your primary Cardiologist (physician) and Advanced Practice Providers (APPs -  Physician Assistants and Nurse Practitioners) who all work together to provide you with the care you need, when you need it.  We recommend signing up for the patient portal called "MyChart".  Sign up information is provided on this After Visit Summary.  MyChart is used to connect with patients for Virtual Visits (Telemedicine).  Patients are able to view lab/test results, encounter notes, upcoming appointments, etc.  Non-urgent messages can be sent to your provider as well.   To learn more about what you can do with MyChart, go to [email or web address].    Your next appointment:    4-6 weeks at the [place] office   The format for your next appointment:   In Person  Provider:   You may see No primary care provider on file. or one of the following Advanced Practice Providers on your designated Care Team:    [place], PA-C   Ermalinda Barrios, [place]

## 2020-05-16 DIAGNOSIS — L570 Actinic keratosis: Secondary | ICD-10-CM | POA: Diagnosis not present

## 2020-05-18 ENCOUNTER — Other Ambulatory Visit: Payer: Self-pay | Admitting: Family Medicine

## 2020-05-25 ENCOUNTER — Other Ambulatory Visit: Payer: Self-pay

## 2020-05-25 ENCOUNTER — Encounter (HOSPITAL_COMMUNITY)
Admission: RE | Admit: 2020-05-25 | Discharge: 2020-05-25 | Disposition: A | Discharge status: Home / Self Care | Payer: PPO | Source: Ambulatory Visit | Attending: Family Medicine | Admitting: Family Medicine

## 2020-05-25 ENCOUNTER — Other Ambulatory Visit (HOSPITAL_COMMUNITY): Payer: PPO

## 2020-05-25 ENCOUNTER — Encounter (HOSPITAL_COMMUNITY): Payer: Self-pay

## 2020-05-25 ENCOUNTER — Ambulatory Visit (HOSPITAL_COMMUNITY)
Admission: RE | Admit: 2020-05-25 | Discharge: 2020-05-25 | Disposition: A | Discharge status: Home / Self Care | Payer: PPO | Source: Ambulatory Visit | Attending: Family Medicine | Admitting: Family Medicine

## 2020-05-25 ENCOUNTER — Encounter (HOSPITAL_BASED_OUTPATIENT_CLINIC_OR_DEPARTMENT_OTHER)
Admission: RE | Admit: 2020-05-25 | Discharge: 2020-05-25 | Disposition: A | Discharge status: Home / Self Care | Payer: PPO | Source: Ambulatory Visit | Attending: Family Medicine | Admitting: Family Medicine

## 2020-05-25 DIAGNOSIS — R0609 Other forms of dyspnea: Secondary | ICD-10-CM

## 2020-05-25 DIAGNOSIS — R079 Chest pain, unspecified: Secondary | ICD-10-CM

## 2020-05-25 DIAGNOSIS — R0600 Dyspnea, unspecified: Secondary | ICD-10-CM

## 2020-05-25 LAB — ECHOCARDIOGRAM COMPLETE
AV Mean grad: 2 mmHg
AV Peak grad: 5.1 mmHg
Ao pk vel: 1.13 m/s
Area-P 1/2: 2.38 cm2
P 1/2 time: 613 msec
S' Lateral: 2.22 cm

## 2020-05-25 LAB — NM MYOCAR MULTI W/SPECT W/WALL MOTION / EF
Estimated workload: 7 METS
Exercise duration (min): 4 min
Exercise duration (sec): 1 s
LV dias vol: 94 mL (ref 62–150)
LV sys vol: 38 mL
MPHR: 143 {beats}/min
Peak HR: 130 {beats}/min
Percent HR: 90 %
RATE: 0.36
RPE: 13
Rest HR: 81 {beats}/min
SDS: 1
SRS: 2
SSS: 3
TID: 1.09

## 2020-05-25 MED ORDER — TECHNETIUM TC 99M TETROFOSMIN IV KIT
10.0000 | PACK | Freq: Once | INTRAVENOUS | Status: AC | PRN
  Administered 2020-05-25: 10.2 via INTRAVENOUS

## 2020-05-25 MED ORDER — SODIUM CHLORIDE FLUSH 0.9 % IV SOLN
INTRAVENOUS | Status: AC
  Administered 2020-05-25: 10 mL via INTRAVENOUS
  Filled 2020-05-25: qty 10

## 2020-05-25 MED ORDER — REGADENOSON 0.4 MG/5ML IV SOLN
INTRAVENOUS | Status: AC
  Filled 2020-05-25: qty 5

## 2020-05-25 MED ORDER — TECHNETIUM TC 99M TETROFOSMIN IV KIT
30.0000 | PACK | Freq: Once | INTRAVENOUS | Status: AC | PRN
  Administered 2020-05-25: 31 via INTRAVENOUS

## 2020-05-25 NOTE — Progress Notes (Signed)
*  PRELIMINARY RESULTS* Echocardiogram 2D Echocardiogram has been performed.  Leavy Cella 05/25/2020, 1:41 PM

## 2020-05-28 ENCOUNTER — Other Ambulatory Visit: Payer: Self-pay

## 2020-05-28 ENCOUNTER — Telehealth: Payer: Self-pay | Admitting: *Deleted

## 2020-05-28 DIAGNOSIS — I10 Essential (primary) hypertension: Secondary | ICD-10-CM

## 2020-05-28 MED ORDER — HYDROCHLOROTHIAZIDE 25 MG PO TABS: 25.0000 mg | ORAL_TABLET | Freq: Every day | ORAL | 2 refills | Status: DC

## 2020-05-28 NOTE — Telephone Encounter (Signed)
-----   Message from Verta Ellen., NP sent at 05/25/2020  5:49 PM EDT ----- Please call the patient and let him know that the echocardiogram showed he had good pumping function of his heart.  Tell him the heart is a little muscular and stiff.  Tell him this causes problems with the heart when it tries to relax and fill with blood.  It does not quite relax as well as it should.  He has a very very mild leaking mitral valve.  Mildly leaking aortic valve.  Thank you

## 2020-05-28 NOTE — Telephone Encounter (Signed)
Patient informed and verbalized understanding of plan. Copy sent to PCP 

## 2020-05-28 NOTE — Telephone Encounter (Signed)
-----   Message from Verta Ellen., NP sent at 05/25/2020  5:51 PM EDT ----- Please call the patient and tell him he had an abnormal stress test.  Tell him there is evidence of prior MI with acute ischemia around that area.  Tell him this is a medium risk study.  If he is continuing to have chest pains, we will likely need to do a cardiac catheterization.  If he is continuing to have anginal symptoms, have him come back a little earlier so we can discuss the findings.  Thank you

## 2020-05-31 ENCOUNTER — Telehealth: Payer: Self-pay

## 2020-05-31 MED ORDER — NITROGLYCERIN 0.4 MG SL SUBL: SUBLINGUAL_TABLET | SUBLINGUAL | 3 refills | Status: AC

## 2020-05-31 NOTE — Telephone Encounter (Signed)
The note I dictated should not have stated acute ischemia. Not sure how that was interpreted by [facility] as acute. Patient did not have active anginal symptoms at time of exam. Other than that fact I reported the providers interpretation of the studies as it was presented. Thanks

## 2020-05-31 NOTE — Telephone Encounter (Signed)
Patient likely needs a heart cath and to hold his K/HCTZ due to azotemia. Can he not be seen by [facility] or in [place] tomorrow ?? I am happy to see him next week but that seems less than ideal

## 2020-05-31 NOTE — Telephone Encounter (Signed)
Update    Patient stopped into office - said that the nurse from [facility] did tell him the results and went over what he was suppose to do for follow up , but his wife did not understand what any of it meant and she went to Dr Moshe Cipro office to get sooner appointment because she did not think that he should have to wait a month to see someone, when he has already had a heart attack.  Told patient I would have nurse call

## 2020-05-31 NOTE — Telephone Encounter (Signed)
New message    Dr Moshe Cipro office called - the patient went to their office and told them that did not get the results to the test for the Myocardial perfusion and Echo - note documented on the test states patient was given the results.   Dr Moshe Cipro wants the [place] office to call the patient not the [facility] office, because the patient is scared and does not know how to proceed,  They want the patient to have an earlier appointment but not in [place].  Patient was told he had another heart attack, but not given any instructions on what to do to prevent another one, should he change his eating habits , cut back on sodium, what is he to do???

## 2020-05-31 NOTE — Telephone Encounter (Signed)
Called patient back with results. Explained more in detail for patient to understand. Patient does not have a cardiologist at this time and lives in [place]. Patient has only seen Katina Dung PA in [place]. Will see if Dr. Johnsie Cancel thinks patient needs to be seen sooner and if he would see him next week about his lexiscan results at [facility] office. Will also see if nitroglycerin can be given to patient, incase he has any chest pain. At this time patient denies any chest pain.

## 2020-05-31 NOTE — Telephone Encounter (Signed)
Paged Dr. Harl Bowie. He will see patient virtually tomorrow to discuss test results and heart cath. Will send nitro to patient's pharmacy as needed for chest pain. Went over instruction for nitro. Received consent for virtual visit. Patient and his wife verbalized understanding and will call if they have any more questions or concerns.    Patient Consent for Virtual Visit         [name] [name] has provided verbal consent on 05/31/2020 for a virtual visit (video or telephone).   CONSENT FOR VIRTUAL VISIT FOR:  [name] [name]  By participating in this virtual visit I agree to the following:  I hereby voluntarily request, consent and authorize [facility] and its employed or contracted physicians, physician assistants, nurse practitioners or other licensed health care professionals (the Practitioner), to provide me with telemedicine health care services (the Services") as deemed necessary by the treating Practitioner. I acknowledge and consent to receive the Services by the Practitioner via telemedicine. I understand that the telemedicine visit will involve communicating with the Practitioner through live audiovisual communication technology and the disclosure of certain medical information by electronic transmission. I acknowledge that I have been given the opportunity to request an in-person assessment or other available alternative prior to the telemedicine visit and am voluntarily participating in the telemedicine visit.  I understand that I have the right to withhold or withdraw my consent to the use of telemedicine in the course of my care at any time, without affecting my right to future care or treatment, and that the Practitioner or I may terminate the telemedicine visit at any time. I understand that I have the right to inspect all information obtained and/or recorded in the course of the telemedicine visit and may receive copies of available information for a reasonable fee.  I  understand that some of the potential risks of receiving the Services via telemedicine include:   Delay or interruption in medical evaluation due to technological equipment failure or disruption;  Information transmitted may not be sufficient (e.g. poor resolution of images) to allow for appropriate medical decision making by the Practitioner; and/or   In rare instances, security protocols could fail, causing a breach of personal health information.  Furthermore, I acknowledge that it is my responsibility to provide information about my medical history, conditions and care that is complete and accurate to the best of my ability. I acknowledge that Practitioner's advice, recommendations, and/or decision may be based on factors not within their control, such as incomplete or inaccurate data provided by me or distortions of diagnostic images or specimens that may result from electronic transmissions. I understand that the practice of medicine is not an exact science and that Practitioner makes no warranties or guarantees regarding treatment outcomes. I acknowledge that a copy of this consent can be made available to me via my patient portal ([place]), or I can request a printed copy by calling the office of [place].    I understand that my insurance will be billed for this visit.   I have read or had this consent read to me.  I understand the contents of this consent, which adequately explains the benefits and risks of the Services being provided via telemedicine.   I have been provided ample opportunity to ask questions regarding this consent and the Services and have had my questions answered to my satisfaction.  I give my informed consent for the services to be provided through the use of telemedicine in my medical  care

## 2020-05-31 NOTE — Telephone Encounter (Signed)
Left message for patient to call back  

## 2020-06-01 ENCOUNTER — Telehealth (INDEPENDENT_AMBULATORY_CARE_PROVIDER_SITE_OTHER): Payer: PPO | Admitting: Cardiology

## 2020-06-01 ENCOUNTER — Encounter: Payer: Self-pay | Admitting: Cardiology

## 2020-06-01 ENCOUNTER — Other Ambulatory Visit: Payer: Self-pay | Admitting: Cardiology

## 2020-06-01 ENCOUNTER — Other Ambulatory Visit: Payer: Self-pay

## 2020-06-01 VITALS — Ht 70.0 in | Wt 222.0 lb

## 2020-06-01 DIAGNOSIS — Z01818 Encounter for other preprocedural examination: Secondary | ICD-10-CM | POA: Diagnosis not present

## 2020-06-01 DIAGNOSIS — R0789 Other chest pain: Secondary | ICD-10-CM

## 2020-06-01 DIAGNOSIS — N183 Chronic kidney disease, stage 3 unspecified: Secondary | ICD-10-CM | POA: Diagnosis not present

## 2020-06-01 MED ORDER — SODIUM CHLORIDE 0.9% FLUSH: 3.0000 mL | Freq: Two times a day (BID) | INTRAVENOUS | Status: DC

## 2020-06-01 NOTE — Progress Notes (Signed)
Virtual Visit via Telephone Note   This visit type was conducted due to national recommendations for restrictions regarding the COVID-19 Pandemic (e.g. social distancing) in an effort to limit this patient's exposure and mitigate transmission in our community.  Due to his co-morbid illnesses, this patient is at least at moderate risk for complications without adequate follow up.  This format is felt to be most appropriate for this patient at this time.  The patient did not have access to video technology/had technical difficulties with video requiring transitioning to audio format only (telephone).  All issues noted in this document were discussed and addressed.  No physical exam could be performed with this format.  Please refer to the patient's chart for his  consent to telehealth for [facility].    Date:  06/01/2020   ID:  [name] [name], DOB 1941/01/27, MRN [id number] The patient was identified using 2 identifiers.  Patient Location: Home Provider Location: Office/Clinic  PCP:  Fayrene Helper, MD  Cardiologist:  Bea Graff Electrophysiologist:  None   Evaluation Performed:  Follow-Up Visit  Chief Complaint:  Follow up  History of Present Illness:    [name] [name] is a 79 y.o. male seen today for a focused visit for chest pain and recent abnormal stress test.   1. Chest pain - ER visit 04/2020 with chest pain - trops neg, EKG SR no ischemic changes - seen by PA Leonides Sake 05/2020 for ongoing symptoms  - 05/2020 echo LVEF 60-65%, no WMAs, grade I DDx, normal RV function - 05/2020 nuclear stress: prior inferior infarct with moderate peri-infarct ischemia. Intermediate risk study  - chest pains have improved since ER visit - mild pressure at times with exertion, SOB with activities.    The patient does not have symptoms concerning for COVID-19 infection (fever, chills, cough, or new shortness of breath).    Past Medical History:  Diagnosis Date  . Anemia, iron  deficiency   . Annual physical exam 09/01/2016  . Cancer ([place])    skin cancers   . Educated about COVID-19 virus infection 03/13/2019  . ELECTROCARDIOGRAM, ABNORMAL 08/18/2010   Qualifier: Diagnosis of  By: Moshe Cipro MD, Joycelyn Schmid    . GERD (gastroesophageal reflux disease)   . H/O hiatal hernia   . Headache   . Hemorrhoids    and minimal polp  . History of kidney stones   . History of migraines childhood    Flared up in 2009, and have responded to depakote which was stopped in 9 months  . Hx of colonic polyps 02/24/2007  . Hyperlipidemia 2003  . Hypertension 2003  . Low sodium levels 02/20/2017  . LUNG NODULE 08/18/2010   Qualifier: Diagnosis of  By: Moshe Cipro MD, Margaret  No need for radiologic f/u unless new symptoms develop   . Lung nodules since 2009   CT scan advised in 08/2010  . Neoplasm    dermal  . Nonspecific abnormal findings on radiological and other examination of skull and head 08/18/2010   Qualifier: Diagnosis of  By: Moshe Cipro MD, Arnell Asal of 08/2010 showed no change over a 2 yr period, no need for further scanning.Pt is a non smoker, will not rept unless symptomatic   . Overweight(278.02)    Past Surgical History:  Procedure Laterality Date  . COLONOSCOPY  2008   Fields-mod IH, 74mm polyp sigmoid colon (disappeared w/ insufflation & could not be retrieved)  . COLONOSCOPY  05/19/2012   Procedure: COLONOSCOPY;  Surgeon: Danie Binder,  MD;  [facility];  Service: Endoscopy;  Laterality: N/A;  8:30  . CYSTOSCOPY W/ URETERAL STENT PLACEMENT Left 11/27/2016   Procedure: CYSTOSCOPY WITH RETROGRADE PYELOGRAM/URETERAL STENT PLACEMENT;  Surgeon: Bjorn Loser, MD;  Location: [facility];  Service: Urology;  Laterality: Left;  . ESOPHAGOGASTRODUODENOSCOPY  01/05/2007   Fields-sm HH, benign SB biopsy  . EXTRACORPOREAL SHOCK WAVE LITHOTRIPSY Left 12/08/2016   Procedure: LEFT EXTRACORPOREAL SHOCK WAVE LITHOTRIPSY (ESWL);  Surgeon: Festus Aloe, MD;  Location: [facility];   Service: Urology;  Laterality: Left;  . GIVENS CAPSULE STUDY  01/20/2007   Fields-nothing to explain IDA, lymphectasias  . removal of skin cancer twice       Current Meds  Medication Sig  . acetaminophen (TYLENOL) 325 MG tablet Take 325 mg by mouth as needed for mild pain.  Marland Kitchen aspirin (ASPIRIN LOW DOSE) 81 MG EC tablet Take 1 tablet (81 mg total) by mouth every morning.  . hydrochlorothiazide (HYDRODIURIL) 25 MG tablet Take 1 tablet (25 mg total) by mouth daily.  Javier Docker Oil 300 MG CAPS Take 1 capsule by mouth at bedtime.   Marland Kitchen loratadine (CLARITIN) 10 MG tablet Take 10 mg by mouth daily.  . metoprolol succinate (TOPROL-XL) 50 MG 24 hr tablet TAKE 1 TABLET BY MOUTH ONCE DAILY WITH  OR  IMMEDIATELY  FOLLOWING  A  MEAL  . Misc Natural Products (BLACK CHERRY CONCENTRATE PO) Take 2 tablets by mouth daily.   . Multiple Vitamin (MULTIVITAMIN WITH MINERALS) TABS Take 1 tablet by mouth daily.  . nitroGLYCERIN (NITROSTAT) 0.4 MG SL tablet Take 1 tab under your tongue while sitting If no relief of chest pain may repeat one tab every 5 minutes up to 3 tablets total over 15 minutes  . potassium chloride (KLOR-CON) 10 MEQ tablet Take 1 tablet by mouth once daily  . simvastatin (ZOCOR) 40 MG tablet TAKE 1 TABLET BY MOUTH AT BEDTIME  . terazosin (HYTRIN) 1 MG capsule Take 1 capsule by mouth twice daily  . terazosin (HYTRIN) 2 MG capsule Take 1 capsule by mouth at bedtime     Allergies:   Ace inhibitors   Social History   Tobacco Use  . Smoking status: Former Smoker    Packs/day: 2.00    Years: 15.00    Pack years: 30.00    Types: Cigarettes    Quit date: 10/20/1978    Years since quitting: 41.6  . Smokeless tobacco: Former Systems developer    Types: [place] date: 10/20/2004  Vaping Use  . Vaping Use: Never used  Substance Use Topics  . Alcohol use: No  . Drug use: No     Family Hx: The patient's family history includes Alzheimer's disease (age of onset: 80) in his mother; Asthma in his father; Heart  defect in his brother and mother; Heart failure in his father; Hypertension in his daughter; Kidney Stones in his daughter. There is no history of Colon cancer.  ROS:   Please see the history of present illness.     All other systems reviewed and are negative.   Prior CV studies:   The following studies were reviewed today:  05/2020 nuclear stress  Blood pressure demonstrated a normal response to exercise.  There was no ST segment deviation noted during stress.  Findings consistent with prior inferior myocardial infarction with moderate peri-infarct ischemia.  This is an intermediate risk study.  The left ventricular ejection fraction is normal (55-65%).  Labs/Other Tests and Data Reviewed:  EKG:  No ECG reviewed.  Recent Labs: 09/28/2019: TSH 3.07 02/16/2020: ALT 19 05/06/2020: BUN 20; Creatinine, Ser 1.78; Hemoglobin 13.8; Platelets 182; Potassium 4.0; Sodium 140   Recent Lipid Panel Lab Results  Component Value Date/Time   CHOL 192 09/28/2019 10:27 AM   TRIG 138 09/28/2019 10:27 AM   HDL 51 09/28/2019 10:27 AM   CHOLHDL 3.8 09/28/2019 10:27 AM   LDLCALC 115 (H) 09/28/2019 10:27 AM    Wt Readings from Last 3 Encounters:  06/01/20 222 lb (100.7 kg)  05/14/20 (!) 220 lb (99.8 kg)  05/06/20 222 lb 10.6 oz (101 kg)     Objective:    Vital Signs:  Ht 5\' 10"  (1.778 m)   Wt 222 lb (100.7 kg)   BMI 31.85 kg/m    Normal affect. Normal speech pattern and tone. Comfortable, no apparent distress. No audible signs of sob or wheezing.   ASSESSMENT & PLAN:    1. Chest pain - ongoing exertional symptoms of chest pressure, DOE - recent abnormal stress test - will plan for cath for further evaluation  - will need early arrival on day of procedure for IVFs.    I have reviewed the risks, indications, and alternatives to cardiac catheterization, possible angioplasty, and stenting with the patient  today. Risks include but are not limited to bleeding, infection, vascular  injury, stroke, myocardial infection, arrhythmia, kidney injury, radiation-related injury in the case of prolonged fluoroscopy use, emergency cardiac surgery, and death. The patient understands the risks of serious complication is 1-2 in 6144 with diagnostic cardiac cath and 1-2% or less with angioplasty/stenting.   COVID-19 Education: The signs and symptoms of COVID-19 were discussed with the patient and how to seek care for testing (follow up with PCP or arrange E-visit).  The importance of social distancing was discussed today.  Time:   Today, I have spent  minutes with the patient with telehealth technology discussing the above problems.     Medication Adjustments/Labs and Tests Ordered: Current medicines are reviewed at length with the patient today.  Concerns regarding medicines are outlined above.   Tests Ordered: No orders of the defined types were placed in this encounter.   Medication Changes: No orders of the defined types were placed in this encounter.   Follow Up:  In Person 3 weeks  Signed, Carlyle Dolly, MD  06/01/2020 3:07 PM

## 2020-06-01 NOTE — H&P (View-Only) (Signed)
Virtual Visit via Telephone Note   This visit type was conducted due to national recommendations for restrictions regarding the COVID-19 Pandemic (e.g. social distancing) in an effort to limit this patient's exposure and mitigate transmission in our community.  Due to his co-morbid illnesses, this patient is at least at moderate risk for complications without adequate follow up.  This format is felt to be most appropriate for this patient at this time.  The patient did not have access to video technology/had technical difficulties with video requiring transitioning to audio format only (telephone).  All issues noted in this document were discussed and addressed.  No physical exam could be performed with this format.  Please refer to the patient's chart for his  consent to telehealth for [facility].    Date:  06/01/2020   ID:  [name] [name], DOB 1941/05/24, MRN [id number] The patient was identified using 2 identifiers.  Patient Location: Home Provider Location: Office/Clinic  PCP:  Fayrene Helper, MD  Cardiologist:  Bea Graff Electrophysiologist:  None   Evaluation Performed:  Follow-Up Visit  Chief Complaint:  Follow up  History of Present Illness:    [name] [name] is a 79 y.o. male seen today for a focused visit for chest pain and recent abnormal stress test.   1. Chest pain - ER visit 04/2020 with chest pain - trops neg, EKG SR no ischemic changes - seen by PA Leonides Sake 05/2020 for ongoing symptoms  - 05/2020 echo LVEF 60-65%, no WMAs, grade I DDx, normal RV function - 05/2020 nuclear stress: prior inferior infarct with moderate peri-infarct ischemia. Intermediate risk study  - chest pains have improved since ER visit - mild pressure at times with exertion, SOB with activities.    The patient does not have symptoms concerning for COVID-19 infection (fever, chills, cough, or new shortness of breath).    Past Medical History:  Diagnosis Date  . Anemia, iron  deficiency   . Annual physical exam 09/01/2016  . Cancer ([place])    skin cancers   . Educated about COVID-19 virus infection 03/13/2019  . ELECTROCARDIOGRAM, ABNORMAL 08/18/2010   Qualifier: Diagnosis of  By: Moshe Cipro MD, Joycelyn Schmid    . GERD (gastroesophageal reflux disease)   . H/O hiatal hernia   . Headache   . Hemorrhoids    and minimal polp  . History of kidney stones   . History of migraines childhood    Flared up in 2009, and have responded to depakote which was stopped in 9 months  . Hx of colonic polyps 02/24/2007  . Hyperlipidemia 2003  . Hypertension 2003  . Low sodium levels 02/20/2017  . LUNG NODULE 08/18/2010   Qualifier: Diagnosis of  By: Moshe Cipro MD, Margaret  No need for radiologic f/u unless new symptoms develop   . Lung nodules since 2009   CT scan advised in 08/2010  . Neoplasm    dermal  . Nonspecific abnormal findings on radiological and other examination of skull and head 08/18/2010   Qualifier: Diagnosis of  By: Moshe Cipro MD, Arnell Asal of 08/2010 showed no change over a 2 yr period, no need for further scanning.Pt is a non smoker, will not rept unless symptomatic   . Overweight(278.02)    Past Surgical History:  Procedure Laterality Date  . COLONOSCOPY  2008   Fields-mod IH, 71mm polyp sigmoid colon (disappeared w/ insufflation & could not be retrieved)  . COLONOSCOPY  05/19/2012   Procedure: COLONOSCOPY;  Surgeon: Danie Binder,  MD;  [facility];  Service: Endoscopy;  Laterality: N/A;  8:30  . CYSTOSCOPY W/ URETERAL STENT PLACEMENT Left 11/27/2016   Procedure: CYSTOSCOPY WITH RETROGRADE PYELOGRAM/URETERAL STENT PLACEMENT;  Surgeon: Bjorn Loser, MD;  [facility];  Service: Urology;  Laterality: Left;  . ESOPHAGOGASTRODUODENOSCOPY  01/05/2007   Fields-sm HH, benign SB biopsy  . EXTRACORPOREAL SHOCK WAVE LITHOTRIPSY Left 12/08/2016   Procedure: LEFT EXTRACORPOREAL SHOCK WAVE LITHOTRIPSY (ESWL);  Surgeon: Festus Aloe, MD;  [facility];   Service: Urology;  Laterality: Left;  . GIVENS CAPSULE STUDY  01/20/2007   Fields-nothing to explain IDA, lymphectasias  . removal of skin cancer twice       Current Meds  Medication Sig  . acetaminophen (TYLENOL) 325 MG tablet Take 325 mg by mouth as needed for mild pain.  Marland Kitchen aspirin (ASPIRIN LOW DOSE) 81 MG EC tablet Take 1 tablet (81 mg total) by mouth every morning.  . hydrochlorothiazide (HYDRODIURIL) 25 MG tablet Take 1 tablet (25 mg total) by mouth daily.  Javier Docker Oil 300 MG CAPS Take 1 capsule by mouth at bedtime.   Marland Kitchen loratadine (CLARITIN) 10 MG tablet Take 10 mg by mouth daily.  . metoprolol succinate (TOPROL-XL) 50 MG 24 hr tablet TAKE 1 TABLET BY MOUTH ONCE DAILY WITH  OR  IMMEDIATELY  FOLLOWING  A  MEAL  . Misc Natural Products (BLACK CHERRY CONCENTRATE PO) Take 2 tablets by mouth daily.   . Multiple Vitamin (MULTIVITAMIN WITH MINERALS) TABS Take 1 tablet by mouth daily.  . nitroGLYCERIN (NITROSTAT) 0.4 MG SL tablet Take 1 tab under your tongue while sitting If no relief of chest pain may repeat one tab every 5 minutes up to 3 tablets total over 15 minutes  . potassium chloride (KLOR-CON) 10 MEQ tablet Take 1 tablet by mouth once daily  . simvastatin (ZOCOR) 40 MG tablet TAKE 1 TABLET BY MOUTH AT BEDTIME  . terazosin (HYTRIN) 1 MG capsule Take 1 capsule by mouth twice daily  . terazosin (HYTRIN) 2 MG capsule Take 1 capsule by mouth at bedtime     Allergies:   Ace inhibitors   Social History   Tobacco Use  . Smoking status: Former Smoker    Packs/day: 2.00    Years: 15.00    Pack years: 30.00    Types: Cigarettes    Quit date: 10/20/1978    Years since quitting: 41.6  . Smokeless tobacco: Former Systems developer    Types: [place] date: 10/20/2004  Vaping Use  . Vaping Use: Never used  Substance Use Topics  . Alcohol use: No  . Drug use: No     Family Hx: The patient's family history includes Alzheimer's disease (age of onset: 66) in his mother; Asthma in his father; Heart  defect in his brother and mother; Heart failure in his father; Hypertension in his daughter; Kidney Stones in his daughter. There is no history of Colon cancer.  ROS:   Please see the history of present illness.     All other systems reviewed and are negative.   Prior CV studies:   The following studies were reviewed today:  05/2020 nuclear stress  Blood pressure demonstrated a normal response to exercise.  There was no ST segment deviation noted during stress.  Findings consistent with prior inferior myocardial infarction with moderate peri-infarct ischemia.  This is an intermediate risk study.  The left ventricular ejection fraction is normal (55-65%).  Labs/Other Tests and Data Reviewed:  EKG:  No ECG reviewed.  Recent Labs: 09/28/2019: TSH 3.07 02/16/2020: ALT 19 05/06/2020: BUN 20; Creatinine, Ser 1.78; Hemoglobin 13.8; Platelets 182; Potassium 4.0; Sodium 140   Recent Lipid Panel Lab Results  Component Value Date/Time   CHOL 192 09/28/2019 10:27 AM   TRIG 138 09/28/2019 10:27 AM   HDL 51 09/28/2019 10:27 AM   CHOLHDL 3.8 09/28/2019 10:27 AM   LDLCALC 115 (H) 09/28/2019 10:27 AM    Wt Readings from Last 3 Encounters:  06/01/20 222 lb (100.7 kg)  05/14/20 (!) 220 lb (99.8 kg)  05/06/20 222 lb 10.6 oz (101 kg)     Objective:    Vital Signs:  Ht 5\' 10"  (1.778 m)   Wt 222 lb (100.7 kg)   BMI 31.85 kg/m    Normal affect. Normal speech pattern and tone. Comfortable, no apparent distress. No audible signs of sob or wheezing.   ASSESSMENT & PLAN:    1. Chest pain - ongoing exertional symptoms of chest pressure, DOE - recent abnormal stress test - will plan for cath for further evaluation  - will need early arrival on day of procedure for IVFs.    I have reviewed the risks, indications, and alternatives to cardiac catheterization, possible angioplasty, and stenting with the patient  today. Risks include but are not limited to bleeding, infection, vascular  injury, stroke, myocardial infection, arrhythmia, kidney injury, radiation-related injury in the case of prolonged fluoroscopy use, emergency cardiac surgery, and death. The patient understands the risks of serious complication is 1-2 in 9470 with diagnostic cardiac cath and 1-2% or less with angioplasty/stenting.   COVID-19 Education: The signs and symptoms of COVID-19 were discussed with the patient and how to seek care for testing (follow up with PCP or arrange E-visit).  The importance of social distancing was discussed today.  Time:   Today, I have spent  minutes with the patient with telehealth technology discussing the above problems.     Medication Adjustments/Labs and Tests Ordered: Current medicines are reviewed at length with the patient today.  Concerns regarding medicines are outlined above.   Tests Ordered: No orders of the defined types were placed in this encounter.   Medication Changes: No orders of the defined types were placed in this encounter.   Follow Up:  In Person 3 weeks  Signed, Carlyle Dolly, MD  06/01/2020 3:07 PM

## 2020-06-01 NOTE — Addendum Note (Signed)
Addended by: Barbarann Ehlers A on: 06/01/2020 04:06 PM   Modules accepted: Orders

## 2020-06-01 NOTE — Patient Instructions (Signed)
Medication Instructions:  Your physician recommends that you continue on your current medications as directed. Please refer to the Current Medication list given to you today.  *If you need a refill on your cardiac medications before your next appointment, please call your pharmacy*   Lab Work: Bcb,bmet on Monday 8/16/21at [place] test at [facility] [place]  If you have labs (blood work) drawn today and your tests are completely normal, you will receive your results only by: Marland Kitchen MyChart Message (if you have MyChart) OR . A paper copy in the mail If you have any lab test that is abnormal or we need to change your treatment, we will call you to review the results.   Testing/Procedures: Your physician has requested that you have a cardiac catheterization. Cardiac catheterization is used to diagnose and/or treat various heart conditions. Doctors may recommend this procedure for a number of different reasons. The most common reason is to evaluate chest pain. Chest pain can be a symptom of coronary artery disease (CAD), and cardiac catheterization can show whether plaque is narrowing or blocking your heart's arteries. This procedure is also used to evaluate the valves, as well as measure the blood flow and oxygen levels in different parts of your heart. For further information please visit [email or web address].[email or web address]. Please follow instruction sheet, as given.     Follow-Up: At [facility], you and your health needs are our priority.  As part of our continuing mission to provide you with exceptional heart care, we have created designated Provider Care Teams.  These Care Teams include your primary Cardiologist (physician) and Advanced Practice Providers (APPs -  Physician Assistants and Nurse Practitioners) who all work together to provide you with the care you need, when you need it.  We recommend signing up for the patient portal called "MyChart".   Sign up information is provided on this After Visit Summary.  MyChart is used to connect with patients for Virtual Visits (Telemedicine).  Patients are able to view lab/test results, encounter notes, upcoming appointments, etc.  Non-urgent messages can be sent to your provider as well.   To learn more about what you can do with MyChart, go to [email or web address].    Your next appointment:   3 week(s)  The format for your next appointment:   In Person  Provider:   Cecilie Kicks, NP   Other Instructions None     Thank you for choosing [place] !

## 2020-06-04 ENCOUNTER — Telehealth: Payer: Self-pay | Admitting: Cardiology

## 2020-06-04 ENCOUNTER — Other Ambulatory Visit (HOSPITAL_COMMUNITY)
Admission: RE | Admit: 2020-06-04 | Discharge: 2020-06-04 | Disposition: A | Discharge status: Home / Self Care | Payer: PPO | Source: Ambulatory Visit | Attending: Cardiology | Admitting: Cardiology

## 2020-06-04 ENCOUNTER — Telehealth: Payer: Self-pay | Admitting: *Deleted

## 2020-06-04 ENCOUNTER — Other Ambulatory Visit (HOSPITAL_COMMUNITY)
Admission: RE | Admit: 2020-06-04 | Discharge: 2020-06-04 | Disposition: A | Discharge status: Home / Self Care | Payer: PPO | Source: Ambulatory Visit | Attending: Interventional Cardiology | Admitting: Interventional Cardiology

## 2020-06-04 DIAGNOSIS — N183 Chronic kidney disease, stage 3 unspecified: Secondary | ICD-10-CM | POA: Insufficient documentation

## 2020-06-04 DIAGNOSIS — R079 Chest pain, unspecified: Secondary | ICD-10-CM

## 2020-06-04 DIAGNOSIS — Z20822 Contact with and (suspected) exposure to covid-19: Secondary | ICD-10-CM | POA: Insufficient documentation

## 2020-06-04 DIAGNOSIS — Z01812 Encounter for preprocedural laboratory examination: Secondary | ICD-10-CM | POA: Diagnosis not present

## 2020-06-04 DIAGNOSIS — Z01818 Encounter for other preprocedural examination: Secondary | ICD-10-CM | POA: Insufficient documentation

## 2020-06-04 LAB — CBC WITH DIFFERENTIAL/PLATELET
Abs Immature Granulocytes: 0.03 10*3/uL (ref 0.00–0.07)
Basophils Absolute: 0.1 10*3/uL (ref 0.0–0.1)
Basophils Relative: 1 %
Eosinophils Absolute: 0.3 10*3/uL (ref 0.0–0.5)
Eosinophils Relative: 5 %
HCT: 41.4 % (ref 39.0–52.0)
Hemoglobin: 13.3 g/dL (ref 13.0–17.0)
Immature Granulocytes: 1 %
Lymphocytes Relative: 26 %
Lymphs Abs: 1.5 10*3/uL (ref 0.7–4.0)
MCH: 29.6 pg (ref 26.0–34.0)
MCHC: 32.1 g/dL (ref 30.0–36.0)
MCV: 92 fL (ref 80.0–100.0)
Monocytes Absolute: 0.6 10*3/uL (ref 0.1–1.0)
Monocytes Relative: 10 %
Neutro Abs: 3.4 10*3/uL (ref 1.7–7.7)
Neutrophils Relative %: 57 %
Platelets: 173 10*3/uL (ref 150–400)
RBC: 4.5 MIL/uL (ref 4.22–5.81)
RDW: 14.2 % (ref 11.5–15.5)
WBC: 5.9 10*3/uL (ref 4.0–10.5)
nRBC: 0 % (ref 0.0–0.2)

## 2020-06-04 LAB — BASIC METABOLIC PANEL
Anion gap: 8 (ref 5–15)
BUN: 23 mg/dL (ref 8–23)
CO2: 25 mmol/L (ref 22–32)
Calcium: 9 mg/dL (ref 8.9–10.3)
Chloride: 108 mmol/L (ref 98–111)
Creatinine, Ser: 1.65 mg/dL — ABNORMAL HIGH (ref 0.61–1.24)
GFR calc Af Amer: 46 mL/min — ABNORMAL LOW (ref >60)
GFR calc non Af Amer: 39 mL/min — ABNORMAL LOW (ref >60)
Glucose, Bld: 94 mg/dL (ref 70–99)
Potassium: 4 mmol/L (ref 3.5–5.1)
Sodium: 141 mmol/L (ref 135–145)

## 2020-06-04 LAB — SARS CORONAVIRUS 2 (TAT 6-24 HRS): SARS Coronavirus 2: NEGATIVE

## 2020-06-04 NOTE — Telephone Encounter (Signed)
Pt contacted pre-catheterization scheduled at [facility] for: Tuesday June 05, 2020 12 Noon Verified arrival time and place: [facility]) at: 7 AM-pre procedure hydration   No solid food after midnight prior to cath, clear liquids until 5 AM day of procedure.  Hold: HCTZ/KCl -day before and day of procedure-GFR 39  Except hold medications AM meds can be  taken pre-cath with sips of water including: ASA 81 mg   Confirmed patient has responsible adult to drive home post procedure and observe 24 hours after arriving home: yes  You are allowed ONE visitor in the waiting room during your procedure. Both you and your visitor must wear a mask once you enter the hospital.       COVID-19 Pre-Screening Questions:  . In the past 7 to 10 days have you had a new cough, shortness of breath, headache, congestion, fever (100 or greater) unexplained body aches, new sore throat, or sudden loss of taste or sense of smell? no . In the past 7 to 10 days have you been around anyone with known Covid 19? no . Have you been vaccinated for COVID-19? Yes, see immunization history    Reviewed procedure/mask/visitor instructions, COVID-19 questions with patient.

## 2020-06-04 NOTE — Telephone Encounter (Signed)
Pt reports last contact with his daughter was 06/02/20, pt states he has been fully vaccinated, is aware to monitor symptoms daily, wear a mask indoors. Pt aware [facility] has been rescheduled to 06/15/20 12 Noon /arrive 7 AM for pre-procedure hydration, will plan to get BMP/CBC/COVID-19 06/13/20.

## 2020-06-04 NOTE — Addendum Note (Signed)
Addended by: Katrine Coho on: 06/04/2020 01:39 PM   Modules accepted: Orders

## 2020-06-04 NOTE — Telephone Encounter (Signed)
Pt need to speak with someone now before procedure tomorrow    Please call [phone]

## 2020-06-04 NOTE — Telephone Encounter (Signed)
Pt reports that last week his daughter was around co-worker that tested positive for COVID-19. Pt reports his daughter had upper respiratory symptoms this morning and her employer recommended that she get tested for COVID-19. Pt reports his daughter had COVID-19 test this morning and was told a short time ago that she tested positive for COVID-19.  Pt reports that he was around his daughter this past week-end.  Pt reports he does not have any symptoms of COVID-19, including fever.

## 2020-06-04 NOTE — Telephone Encounter (Signed)
Forwarded message to [employer]

## 2020-06-11 ENCOUNTER — Ambulatory Visit: Payer: PPO | Admitting: Family Medicine

## 2020-06-13 ENCOUNTER — Other Ambulatory Visit (HOSPITAL_COMMUNITY)
Admission: RE | Admit: 2020-06-13 | Discharge: 2020-06-13 | Disposition: A | Discharge status: Home / Self Care | Payer: PPO | Source: Ambulatory Visit | Attending: Cardiology | Admitting: Cardiology

## 2020-06-13 ENCOUNTER — Other Ambulatory Visit: Payer: Self-pay

## 2020-06-13 DIAGNOSIS — N183 Chronic kidney disease, stage 3 unspecified: Secondary | ICD-10-CM

## 2020-06-13 DIAGNOSIS — Z01812 Encounter for preprocedural laboratory examination: Secondary | ICD-10-CM | POA: Diagnosis not present

## 2020-06-13 DIAGNOSIS — Z20822 Contact with and (suspected) exposure to covid-19: Secondary | ICD-10-CM | POA: Diagnosis not present

## 2020-06-13 DIAGNOSIS — R079 Chest pain, unspecified: Secondary | ICD-10-CM | POA: Insufficient documentation

## 2020-06-13 LAB — SARS CORONAVIRUS 2 (TAT 6-24 HRS): SARS Coronavirus 2: NEGATIVE

## 2020-06-13 LAB — CBC
HCT: 42.5 % (ref 39.0–52.0)
Hemoglobin: 13.7 g/dL (ref 13.0–17.0)
MCH: 29.5 pg (ref 26.0–34.0)
MCHC: 32.2 g/dL (ref 30.0–36.0)
MCV: 91.4 fL (ref 80.0–100.0)
Platelets: 178 10*3/uL (ref 150–400)
RBC: 4.65 MIL/uL (ref 4.22–5.81)
RDW: 14 % (ref 11.5–15.5)
WBC: 6 10*3/uL (ref 4.0–10.5)
nRBC: 0 % (ref 0.0–0.2)

## 2020-06-13 LAB — BASIC METABOLIC PANEL
Anion gap: 10 (ref 5–15)
BUN: 20 mg/dL (ref 8–23)
CO2: 26 mmol/L (ref 22–32)
Calcium: 9 mg/dL (ref 8.9–10.3)
Chloride: 103 mmol/L (ref 98–111)
Creatinine, Ser: 1.73 mg/dL — ABNORMAL HIGH (ref 0.61–1.24)
GFR calc Af Amer: 43 mL/min — ABNORMAL LOW (ref >60)
GFR calc non Af Amer: 37 mL/min — ABNORMAL LOW (ref >60)
Glucose, Bld: 93 mg/dL (ref 70–99)
Potassium: 3.6 mmol/L (ref 3.5–5.1)
Sodium: 139 mmol/L (ref 135–145)

## 2020-06-14 ENCOUNTER — Telehealth: Payer: Self-pay | Admitting: *Deleted

## 2020-06-14 ENCOUNTER — Telehealth: Payer: Self-pay | Admitting: Cardiology

## 2020-06-14 NOTE — Telephone Encounter (Signed)
Pt sates he is returning a call YP:DPII work and COVID testing on yesterday    Please call [phone]   Thanks renee

## 2020-06-14 NOTE — Telephone Encounter (Signed)
Pt contacted pre-catheterization scheduled at [facility] for: Friday June 15, 2020 12 Noon Verified arrival time and place: [facility]) at: 7 AM-pre-procedure hydration   No solid food after midnight prior to cath, clear liquids until 5 AM day of procedure.  Hold: HCTZ/KCl-day before and day of procedure-GFR 37  Except hold medications AM meds can be  taken pre-cath with sips of water including: ASA 81 mg   Confirmed patient has responsible adult to drive home post procedure and observe 24 hours after arriving home: yes  You are allowed ONE visitor in the waiting room during the time you are at the hospital for your procedure. Both you and your visitor must wear a mask once you enter the hospital.       COVID-19 Pre-Screening Questions:  . In the past 10 days have you had a new cough, shortness of breath, headache, congestion, fever (100 or greater) unexplained body aches, new sore throat, or sudden loss of taste or sense of smell? no . In the past 10 days have you been around anyone with known Covid 19? no . Have you been vaccinated for COVID-19? Yes, see immunization history  Reviewed procedure/mask/visitor instructions, COVID-19 questions with patient.

## 2020-06-14 NOTE — Telephone Encounter (Signed)
Pt notified that result note is not available to nursing staff at this time. Pt voiced that he spoke with Desiree Lucy, RN and was given results.

## 2020-06-15 ENCOUNTER — Ambulatory Visit (HOSPITAL_COMMUNITY)
Admission: RE | Admit: 2020-06-15 | Discharge: 2020-06-15 | Disposition: A | Discharge status: Home / Self Care | Payer: PPO | Attending: Cardiology | Admitting: Cardiology

## 2020-06-15 ENCOUNTER — Encounter (HOSPITAL_COMMUNITY)
Admission: RE | Disposition: A | Discharge status: Home / Self Care | Payer: PPO | Source: Home / Self Care | Attending: Cardiology

## 2020-06-15 ENCOUNTER — Other Ambulatory Visit: Payer: Self-pay

## 2020-06-15 DIAGNOSIS — R0609 Other forms of dyspnea: Secondary | ICD-10-CM | POA: Diagnosis not present

## 2020-06-15 DIAGNOSIS — Z79899 Other long term (current) drug therapy: Secondary | ICD-10-CM | POA: Insufficient documentation

## 2020-06-15 DIAGNOSIS — R072 Precordial pain: Secondary | ICD-10-CM

## 2020-06-15 DIAGNOSIS — E669 Obesity, unspecified: Secondary | ICD-10-CM | POA: Insufficient documentation

## 2020-06-15 DIAGNOSIS — D509 Iron deficiency anemia, unspecified: Secondary | ICD-10-CM | POA: Diagnosis not present

## 2020-06-15 DIAGNOSIS — I2510 Atherosclerotic heart disease of native coronary artery without angina pectoris: Secondary | ICD-10-CM | POA: Diagnosis not present

## 2020-06-15 DIAGNOSIS — Z6832 Body mass index (BMI) 32.0-32.9, adult: Secondary | ICD-10-CM | POA: Insufficient documentation

## 2020-06-15 DIAGNOSIS — E785 Hyperlipidemia, unspecified: Secondary | ICD-10-CM | POA: Diagnosis present

## 2020-06-15 DIAGNOSIS — Z7982 Long term (current) use of aspirin: Secondary | ICD-10-CM | POA: Diagnosis not present

## 2020-06-15 DIAGNOSIS — K219 Gastro-esophageal reflux disease without esophagitis: Secondary | ICD-10-CM | POA: Diagnosis not present

## 2020-06-15 DIAGNOSIS — R9439 Abnormal result of other cardiovascular function study: Secondary | ICD-10-CM | POA: Diagnosis present

## 2020-06-15 DIAGNOSIS — R0789 Other chest pain: Secondary | ICD-10-CM

## 2020-06-15 DIAGNOSIS — I10 Essential (primary) hypertension: Secondary | ICD-10-CM | POA: Diagnosis present

## 2020-06-15 DIAGNOSIS — Z87891 Personal history of nicotine dependence: Secondary | ICD-10-CM | POA: Insufficient documentation

## 2020-06-15 DIAGNOSIS — I209 Angina pectoris, unspecified: Secondary | ICD-10-CM | POA: Diagnosis present

## 2020-06-15 DIAGNOSIS — N183 Chronic kidney disease, stage 3 unspecified: Secondary | ICD-10-CM | POA: Diagnosis present

## 2020-06-15 DIAGNOSIS — N1832 Chronic kidney disease, stage 3b: Secondary | ICD-10-CM | POA: Diagnosis present

## 2020-06-15 HISTORY — PX: LEFT HEART CATH AND CORONARY ANGIOGRAPHY: CATH118249

## 2020-06-15 SURGERY — LEFT HEART CATH AND CORONARY ANGIOGRAPHY
Anesthesia: LOCAL

## 2020-06-15 MED ORDER — HEPARIN (PORCINE) IN NACL 1000-0.9 UT/500ML-% IV SOLN
INTRAVENOUS | Status: DC | PRN
  Administered 2020-06-15 (×2): 500 mL

## 2020-06-15 MED ORDER — ONDANSETRON HCL 4 MG/2ML IJ SOLN: 4.0000 mg | Freq: Four times a day (QID) | INTRAMUSCULAR | Status: DC | PRN

## 2020-06-15 MED ORDER — ASPIRIN 81 MG PO CHEW: 81.0000 mg | CHEWABLE_TABLET | ORAL | Status: DC

## 2020-06-15 MED ORDER — MIDAZOLAM HCL 2 MG/2ML IJ SOLN
INTRAMUSCULAR | Status: AC
  Filled 2020-06-15: qty 2

## 2020-06-15 MED ORDER — SODIUM CHLORIDE 0.9 % WEIGHT BASED INFUSION: 1.0000 mL/kg/h | INTRAVENOUS | Status: DC

## 2020-06-15 MED ORDER — HEPARIN SODIUM (PORCINE) 1000 UNIT/ML IJ SOLN
INTRAMUSCULAR | Status: DC | PRN
  Administered 2020-06-15: 5000 [IU] via INTRAVENOUS

## 2020-06-15 MED ORDER — SODIUM CHLORIDE 0.9 % IV SOLN: 250.0000 mL | INTRAVENOUS | Status: DC | PRN

## 2020-06-15 MED ORDER — SODIUM CHLORIDE 0.9% FLUSH: 3.0000 mL | INTRAVENOUS | Status: DC | PRN

## 2020-06-15 MED ORDER — LIDOCAINE HCL (PF) 1 % IJ SOLN
INTRAMUSCULAR | Status: DC | PRN
  Administered 2020-06-15: 2 mL

## 2020-06-15 MED ORDER — MIDAZOLAM HCL 2 MG/2ML IJ SOLN
INTRAMUSCULAR | Status: DC | PRN
  Administered 2020-06-15 (×2): 1 mg via INTRAVENOUS

## 2020-06-15 MED ORDER — HEPARIN (PORCINE) IN NACL 1000-0.9 UT/500ML-% IV SOLN
INTRAVENOUS | Status: AC
  Filled 2020-06-15: qty 1000

## 2020-06-15 MED ORDER — SODIUM CHLORIDE 0.9 % WEIGHT BASED INFUSION
3.0000 mL/kg/h | INTRAVENOUS | Status: AC
  Administered 2020-06-15: 3 mL/kg/h via INTRAVENOUS

## 2020-06-15 MED ORDER — FENTANYL CITRATE (PF) 100 MCG/2ML IJ SOLN
INTRAMUSCULAR | Status: AC
  Filled 2020-06-15: qty 2

## 2020-06-15 MED ORDER — FENTANYL CITRATE (PF) 100 MCG/2ML IJ SOLN
INTRAMUSCULAR | Status: DC | PRN
  Administered 2020-06-15: 50 ug via INTRAVENOUS
  Administered 2020-06-15: 25 ug via INTRAVENOUS

## 2020-06-15 MED ORDER — ACETAMINOPHEN 325 MG PO TABS: 650.0000 mg | ORAL_TABLET | ORAL | Status: DC | PRN

## 2020-06-15 MED ORDER — SODIUM CHLORIDE 0.9% FLUSH: 3.0000 mL | Freq: Two times a day (BID) | INTRAVENOUS | Status: DC

## 2020-06-15 MED ORDER — VERAPAMIL HCL 2.5 MG/ML IV SOLN
INTRAVENOUS | Status: DC | PRN
  Administered 2020-06-15: 10 mL via INTRA_ARTERIAL

## 2020-06-15 MED ORDER — LIDOCAINE HCL (PF) 1 % IJ SOLN
INTRAMUSCULAR | Status: AC
  Filled 2020-06-15: qty 30

## 2020-06-15 MED ORDER — HEPARIN SODIUM (PORCINE) 1000 UNIT/ML IJ SOLN
INTRAMUSCULAR | Status: AC
  Filled 2020-06-15: qty 1

## 2020-06-15 MED ORDER — VERAPAMIL HCL 2.5 MG/ML IV SOLN
INTRAVENOUS | Status: AC
  Filled 2020-06-15: qty 2

## 2020-06-15 SURGICAL SUPPLY — 9 items
CATH 5FR JL3.5 JR4 ANG PIG MP (CATHETERS) ×1 IMPLANT
DEVICE RAD COMP TR BAND LRG (VASCULAR PRODUCTS) ×1 IMPLANT
GLIDESHEATH SLEND SS 6F .021 (SHEATH) ×2 IMPLANT
GUIDEWIRE INQWIRE 1.5J.035X260 (WIRE) IMPLANT
INQWIRE 1.5J .035X260CM (WIRE) ×2
KIT HEART LEFT (KITS) ×2 IMPLANT
PACK CARDIAC CATHETERIZATION (CUSTOM PROCEDURE TRAY) ×2 IMPLANT
TRANSDUCER W/STOPCOCK (MISCELLANEOUS) ×2 IMPLANT
TUBING CIL FLEX 10 FLL-RA (TUBING) ×2 IMPLANT

## 2020-06-15 NOTE — Interval H&P Note (Signed)
History and Physical Interval Note:  06/15/2020 12:19 PM  Tracker Mance Caradine  has presented today for surgery, with the diagnosis of abnormal nuc.  The various methods of treatment have been discussed with the patient and family. After consideration of risks, benefits and other options for treatment, the patient has consented to  Procedure(s): LEFT HEART CATH AND CORONARY ANGIOGRAPHY (N/A) as a surgical intervention.  The patient's history has been reviewed, patient examined, no change in status, stable for surgery.  I have reviewed the patient's chart and labs.  Questions were answered to the patient's satisfaction.    Cath Lab Visit (complete for each Cath Lab visit)  Clinical Evaluation Leading to the Procedure:   ACS: No.  Non-ACS:    Anginal Classification: CCS II  Anti-ischemic medical therapy: Minimal Therapy (1 class of medications)  Non-Invasive Test Results: Intermediate-risk stress test findings: cardiac mortality 1-3%/year  Prior CABG: No previous CABG       Collier Salina [facility] 06/15/2020 12:19 PM

## 2020-06-15 NOTE — Discharge Instructions (Signed)
Drink plenty of fluid for 48 hours and keep wrist elevated at heart level for 24 hours  Radial Site Care   This sheet gives you information about how to care for yourself after your procedure. Your health care provider may also give you more specific instructions. If you have problems or questions, contact your health care provider. What can I expect after the procedure? After the procedure, it is common to have:  Bruising and tenderness at the catheter insertion area. Follow these instructions at home: Medicines  Take over-the-counter and prescription medicines only as told by your health care provider. Insertion site care 1. Follow instructions from your health care provider about how to take care of your insertion site. Make sure you: ? Wash your hands with soap and water before you change your bandage (dressing). If soap and water are not available, use hand sanitizer. ? remove your dressing as told by your health care provider. In 24 hours 2. Check your insertion site every day for signs of infection. Check for: ? Redness, swelling, or pain. ? Fluid or blood. ? Pus or a bad smell. ? Warmth. 3. Do not take baths, swim, or use a hot tub until your health care provider approves. 4. You may shower 24-48 hours after the procedure, or as directed by your health care provider. ? Remove the dressing and gently wash the site with plain soap and water. ? Pat the area dry with a clean towel. ? Do not rub the site. That could cause bleeding. 5. Do not apply powder or lotion to the site. Activity   1. For 24 hours after the procedure, or as directed by your health care provider: ? Do not flex or bend the affected arm. ? Do not push or pull heavy objects with the affected arm. ? Do not drive yourself home from the hospital or clinic. You may drive 24 hours after the procedure unless your health care provider tells you not to. ? Do not operate machinery or power tools. 2. Do not lift  anything that is heavier than 10 lb (4.5 kg), or the limit that you are told, until your health care provider says that it is safe. For 4 days 3. Ask your health care provider when it is okay to: ? Return to work or school. ? Resume usual physical activities or sports. ? Resume sexual activity. General instructions  If the catheter site starts to bleed, raise your arm and put firm pressure on the site. If the bleeding does not stop, get help right away. This is a medical emergency.  If you went home on the same day as your procedure, a responsible adult should be with you for the first 24 hours after you arrive home.  Keep all follow-up visits as told by your health care provider. This is important. Contact a health care provider if:  You have a fever.  You have redness, swelling, or yellow drainage around your insertion site. Get help right away if:  You have unusual pain at the radial site.  The catheter insertion area swells very fast.  The insertion area is bleeding, and the bleeding does not stop when you hold steady pressure on the area.  Your arm or hand becomes pale, cool, tingly, or numb. These symptoms may represent a serious problem that is an emergency. Do not wait to see if the symptoms will go away. Get medical help right away. Call your local emergency services (911 in the [place] Do not   drive yourself to the hospital. Summary  After the procedure, it is common to have bruising and tenderness at the site.  Follow instructions from your health care provider about how to take care of your radial site wound. Check the wound every day for signs of infection.  Do not lift anything that is heavier than 10 lb (4.5 kg), or the limit that you are told, until your health care provider says that it is safe. This information is not intended to replace advice given to you by your health care provider. Make sure you discuss any questions you have with your health care  provider. Document Revised: 11/11/2017 Document Reviewed: 11/11/2017 Elsevier Patient Education  2020 Elsevier Inc.  

## 2020-06-15 NOTE — Progress Notes (Signed)
Patient and wife was given discharge instructions. Both verbalized understanding. 

## 2020-06-18 ENCOUNTER — Encounter (HOSPITAL_COMMUNITY): Payer: Self-pay | Admitting: Cardiology

## 2020-06-26 ENCOUNTER — Telehealth: Payer: Self-pay | Admitting: *Deleted

## 2020-06-26 NOTE — Telephone Encounter (Signed)
Pt made aware at recent cath  

## 2020-06-26 NOTE — Telephone Encounter (Signed)
-----   Message from Arnoldo Lenis, MD sent at 06/12/2020  3:15 PM EDT ----- Labs show some kidney dysfunction but improved from prior check  Zandra Abts MD

## 2020-06-27 NOTE — Progress Notes (Signed)
Cardiology Office Note   Date:  06/28/2020   ID:  [name] [name], DOB Aug 05, 1941, MRN [id number]  PCP:  Fayrene Helper, MD  Cardiologist: Dr. Harl Bowie    Chief Complaint  Patient presents with  . Hospitalization Follow-up      History of Present Illness: [name] [name] is a 79 y.o. male who presents for post hospitalization.  Pt had been seen in ER  04/2020 with chest pain - trops neg, EKG SR no ischemic changes - seen by PA Leonides Sake 05/2020 for ongoing symptoms  - 05/2020 echo LVEF 60-65%, no WMAs, grade I DDx, normal RV function - 05/2020 nuclear stress: prior inferior infarct with moderate peri-infarct ischemia. Intermediate risk study  With abnormal nuc study  cardiac cath was arranged.  Cath minimal non obstructive CAD.  Reassuring.   Today no further problems. No chest pain or SOB.  No problems at the cath site.   Past Medical History:  Diagnosis Date  . Anemia, iron deficiency   . Annual physical exam 09/01/2016  . Cancer ([place])    skin cancers   . Educated about COVID-19 virus infection 03/13/2019  . ELECTROCARDIOGRAM, ABNORMAL 08/18/2010   Qualifier: Diagnosis of  By: Moshe Cipro MD, Joycelyn Schmid    . GERD (gastroesophageal reflux disease)   . H/O hiatal hernia   . Headache   . Hemorrhoids    and minimal polp  . History of kidney stones   . History of migraines childhood    Flared up in 2009, and have responded to depakote which was stopped in 9 months  . Hx of colonic polyps 02/24/2007  . Hyperlipidemia 2003  . Hypertension 2003  . Low sodium levels 02/20/2017  . LUNG NODULE 08/18/2010   Qualifier: Diagnosis of  By: Moshe Cipro MD, Margaret  No need for radiologic f/u unless new symptoms develop   . Lung nodules since 2009   CT scan advised in 08/2010  . Neoplasm    dermal  . Nonspecific abnormal findings on radiological and other examination of skull and head 08/18/2010   Qualifier: Diagnosis of  By: Moshe Cipro MD, Arnell Asal of 08/2010 showed no change over a 2  yr period, no need for further scanning.Pt is a non smoker, will not rept unless symptomatic   . Overweight(278.02)     Past Surgical History:  Procedure Laterality Date  . COLONOSCOPY  2008   Fields-mod IH, 22mm polyp sigmoid colon (disappeared w/ insufflation & could not be retrieved)  . COLONOSCOPY  05/19/2012   Procedure: COLONOSCOPY;  Surgeon: Danie Binder, MD;  Location: [facility];  Service: Endoscopy;  Laterality: N/A;  8:30  . CYSTOSCOPY W/ URETERAL STENT PLACEMENT Left 11/27/2016   Procedure: CYSTOSCOPY WITH RETROGRADE PYELOGRAM/URETERAL STENT PLACEMENT;  Surgeon: Bjorn Loser, MD;  Location: [facility] OR[facility];  Service: Urology;  Laterality: Left;  . ESOPHAGOGASTRODUODENOSCOPY  01/05/2007   Fields-sm HH, benign SB biopsy  . EXTRACORPOREAL SHOCK WAVE LITHOTRIPSY Left 12/08/2016   Procedure: LEFT EXTRACORPOREAL SHOCK WAVE LITHOTRIPSY (ESWL);  Surgeon: Festus Aloe, MD;  Location: [facility];  Service: Urology;  Laterality: Left;  . GIVENS CAPSULE STUDY  01/20/2007   Fields-nothing to explain IDA, lymphectasias  . LEFT HEART CATH AND CORONARY ANGIOGRAPHY N/A 06/15/2020   Procedure: LEFT HEART CATH AND CORONARY ANGIOGRAPHY;  Surgeon: Martinique, Peter M, MD;  Location: [facility];  Service: Cardiovascular;  Laterality: N/A;  . removal of skin cancer twice       Current Outpatient Medications  Medication  Sig Dispense Refill  . acetaminophen (TYLENOL) 325 MG tablet Take 650 mg by mouth as needed for mild pain.     Marland Kitchen aspirin (ASPIRIN LOW DOSE) 81 MG EC tablet Take 1 tablet (81 mg total) by mouth every morning. 30 tablet 12  . hydrochlorothiazide (HYDRODIURIL) 25 MG tablet Take 1 tablet (25 mg total) by mouth daily. 30 tablet 2  . Krill Oil 300 MG CAPS Take 300 mg by mouth at bedtime.     Marland Kitchen loratadine (CLARITIN) 10 MG tablet Take 10 mg by mouth daily.    . metoprolol succinate (TOPROL-XL) 50 MG 24 hr tablet TAKE 1 TABLET BY MOUTH ONCE DAILY WITH  OR  IMMEDIATELY  FOLLOWING  A  MEAL  (Patient taking differently: Take 50 mg by mouth daily. ) 90 tablet 0  . Misc Natural Products (BLACK CHERRY CONCENTRATE PO) Take 1 Dose by mouth daily.     . Multiple Vitamin (MULTIVITAMIN WITH MINERALS) TABS Take 1 tablet by mouth daily.    . nitroGLYCERIN (NITROSTAT) 0.4 MG SL tablet Take 1 tab under your tongue while sitting If no relief of chest pain may repeat one tab every 5 minutes up to 3 tablets total over 15 minutes (Patient taking differently: Place 0.4 mg under the tongue every 5 (five) minutes as needed for chest pain. up to 3 tablets total over 15 minutes) 25 tablet 3  . potassium chloride (KLOR-CON) 10 MEQ tablet Take 1 tablet by mouth once daily (Patient taking differently: Take 10 mEq by mouth daily. ) 90 tablet 0  . simvastatin (ZOCOR) 40 MG tablet TAKE 1 TABLET BY MOUTH AT BEDTIME (Patient taking differently: Take 40 mg by mouth at bedtime. ) 90 tablet 1  . terazosin (HYTRIN) 1 MG capsule Take 1 capsule by mouth twice daily (Patient taking differently: Take 1 mg by mouth 2 (two) times daily. ) 180 capsule 0  . terazosin (HYTRIN) 2 MG capsule Take 1 capsule by mouth at bedtime (Patient taking differently: Take 2 mg by mouth at bedtime. Take with the second 1 mg dose to equal 3 mg at bedtime) 90 capsule 0   Current Facility-Administered Medications  Medication Dose Route Frequency Provider Last Rate Last Admin  . sodium chloride flush (NS) 0.9 % injection 3 mL  3 mL Intravenous Q12H Branch, Alphonse Guild, MD        Allergies:   Ace inhibitors    Social History:  The patient  reports that he quit smoking about 41 years ago. His smoking use included cigarettes. He has a 30.00 pack-year smoking history. He quit smokeless tobacco use about 15 years ago.  His smokeless tobacco use included chew. He reports that he does not drink alcohol and does not use drugs.   Family History:  The patient's family history includes Alzheimer's disease (age of onset: 20) in his mother; Asthma in his  father; Heart defect in his brother and mother; Heart failure in his father; Hypertension in his daughter; Kidney Stones in his daughter.    ROS:  General:no colds or fevers, no weight changes Skin:no rashes or ulcers HEENT:no blurred vision, no congestion CV:see HPI PUL:see HPI GI:no diarrhea constipation or melena, no indigestion GU:no hematuria, no dysuria MS:no joint pain, no claudication Neuro:no syncope, no lightheadedness Endo:no diabetes, no thyroid disease  Wt Readings from Last 3 Encounters:  06/28/20 221 lb 12.8 oz (100.6 kg)  06/15/20 223 lb (101.2 kg)  06/01/20 222 lb (100.7 kg)     PHYSICAL EXAM: VS:  BP 140/78   Pulse 82   Ht 5\' 10"  (1.778 m)   Wt 221 lb 12.8 oz (100.6 kg)   SpO2 95%   BMI 31.82 kg/m  , BMI Body mass index is 31.82 kg/m. General:Pleasant affect, NAD Skin:Warm and dry, brisk capillary refill HEENT:normocephalic, sclera clear, mucus membranes moist Neck:supple, no JVD, no bruits  Heart:S1S2 RRR without murmur, gallup, rub or click Lungs:clear without rales, rhonchi, or wheezes LKT:GYBW, non tender, + BS, do not palpate liver spleen or masses Ext:no lower ext edema, 2+ pedal pulses, 2+ radial pulses Neuro:alert and oriented, MAE, follows commands, + facial symmetry    EKG:  EKG is NOT ordered today.    Recent Labs: 09/28/2019: TSH 3.07 02/16/2020: ALT 19 06/13/2020: BUN 20; Creatinine, Ser 1.73; Hemoglobin 13.7; Platelets 178; Potassium 3.6; Sodium 139    Lipid Panel    Component Value Date/Time   CHOL 192 09/28/2019 1027   TRIG 138 09/28/2019 1027   HDL 51 09/28/2019 1027   CHOLHDL 3.8 09/28/2019 1027   VLDL 23 02/17/2017 0803   LDLCALC 115 (H) 09/28/2019 1027       Other studies Reviewed: Additional studies/ records that were reviewed today include: . Echo 05/25/20 IMPRESSIONS    1. Left ventricular ejection fraction, by estimation, is 60 to 65%. The  left ventricle has normal function. The left ventricle has no  regional  wall motion abnormalities. There is moderate left ventricular hypertrophy.  Left ventricular diastolic  parameters are consistent with Grade I diastolic dysfunction (impaired  relaxation).  2. Right ventricular systolic function is normal. The right ventricular  size is normal.  3. The mitral valve is normal in structure. Trivial mitral valve  regurgitation. No evidence of mitral stenosis.  4. The aortic valve is tricuspid. Aortic valve regurgitation is mild. No  aortic stenosis is present.  5. Aortic dilatation noted. There is mild dilatation of the aortic root  measuring 40 mm.  6. The inferior vena cava is normal in size with greater than 50%  respiratory variability, suggesting right atrial pressure of 3 mmHg.   FINDINGS  Left Ventricle: Left ventricular ejection fraction, by estimation, is 60  to 65%. The left ventricle has normal function. The left ventricle has no  regional wall motion abnormalities. The left ventricular internal cavity  size was normal in size. There is  moderate left ventricular hypertrophy. Left ventricular diastolic  parameters are consistent with Grade I diastolic dysfunction (impaired  relaxation). Normal left ventricular filling pressure.   Right Ventricle: The right ventricular size is normal. No increase in  right ventricular wall thickness. Right ventricular systolic function is  normal.   Left Atrium: Left atrial size was normal in size.   Right Atrium: Right atrial size was normal in size.   Pericardium: There is no evidence of pericardial effusion.   Mitral Valve: The mitral valve is normal in structure. Trivial mitral  valve regurgitation. No evidence of mitral valve stenosis.   Tricuspid Valve: The tricuspid valve is normal in structure. Tricuspid  valve regurgitation is not demonstrated. No evidence of tricuspid  stenosis.   Aortic Valve: The aortic valve is tricuspid. Aortic valve regurgitation is  mild. Aortic  regurgitation PHT measures 613 msec. No aortic stenosis is  present. Aortic valve mean gradient measures 2.0 mmHg. Aortic valve peak  gradient measures 5.1 mmHg.   Pulmonic Valve: The pulmonic valve was not well visualized. Pulmonic valve  regurgitation is not visualized. No evidence of pulmonic stenosis.   Aorta:  Aortic dilatation noted. There is mild dilatation of the aortic  root measuring 40 mm.   Pulmonary Artery: Indeterminant PASP, inadequate TR jet.   Venous: The inferior vena cava is normal in size with greater than 50%  respiratory variability, suggesting right atrial pressure of 3 mmHg.   Cardiac cath 4/74/25  LV end diastolic pressure is normal.   1. Minimal nonobstructive CAD 2. Normal LVEDP  Plan: risk factor modification.    ASSESSMENT AND PLAN:  1.  Chest pain, non cardiac, manage statins, HTN.  Discussed importance of exercise. Pt can follow up PRN if any issues.  Reviewed cath and stress test with him.     Current medicines are reviewed with the patient today.  The patient Has no concerns regarding medicines.  The following changes have been made:  See above Labs/ tests ordered today include:see above  Disposition:   FU:  see above  Signed, Cecilie Kicks, NP  06/28/2020 [facility] [place], [place], [place] [place], [place] Phone: [phone]; Fax: [phone]

## 2020-06-28 ENCOUNTER — Encounter: Payer: Self-pay | Admitting: Cardiology

## 2020-06-28 ENCOUNTER — Other Ambulatory Visit: Payer: Self-pay

## 2020-06-28 ENCOUNTER — Ambulatory Visit: Payer: PPO | Admitting: Cardiology

## 2020-06-28 VITALS — BP 140/78 | HR 82 | Ht 70.0 in | Wt 221.8 lb

## 2020-06-28 DIAGNOSIS — R079 Chest pain, unspecified: Secondary | ICD-10-CM | POA: Diagnosis not present

## 2020-06-28 DIAGNOSIS — I10 Essential (primary) hypertension: Secondary | ICD-10-CM | POA: Diagnosis not present

## 2020-06-28 NOTE — Patient Instructions (Signed)
Medication Instructions:  Your physician recommends that you continue on your current medications as directed. Please refer to the Current Medication list given to you today.  *If you need a refill on your cardiac medications before your next appointment, please call your pharmacy*   Lab Work: None   If you have labs (blood work) drawn today and your tests are completely normal, you will receive your results only by: Marland Kitchen MyChart Message (if you have MyChart) OR . A paper copy in the mail If you have any lab test that is abnormal or we need to change your treatment, we will call you to review the results.   Testing/Procedures: None    Follow-Up: At [facility], you and your health needs are our priority.  As part of our continuing mission to provide you with exceptional heart care, we have created designated Provider Care Teams.  These Care Teams include your primary Cardiologist (physician) and Advanced Practice Providers (APPs -  Physician Assistants and Nurse Practitioners) who all work together to provide you with the care you need, when you need it.  We recommend signing up for the patient portal called "MyChart".  Sign up information is provided on this After Visit Summary.  MyChart is used to connect with patients for Virtual Visits (Telemedicine).  Patients are able to view lab/test results, encounter notes, upcoming appointments, etc.  Non-urgent messages can be sent to your provider as well.   To learn more about what you can do with MyChart, go to [email or web address].    Your next appointment:    As Needed   The format for your next appointment:   In Person  Provider:      Other Instructions Thank you for choosing [facility]!

## 2020-07-01 ENCOUNTER — Other Ambulatory Visit: Payer: Self-pay | Admitting: Family Medicine

## 2020-07-02 ENCOUNTER — Ambulatory Visit: Payer: PPO | Admitting: Physician Assistant

## 2020-07-22 ENCOUNTER — Other Ambulatory Visit: Payer: Self-pay | Admitting: Family Medicine

## 2020-07-27 ENCOUNTER — Other Ambulatory Visit: Payer: Self-pay

## 2020-07-27 ENCOUNTER — Ambulatory Visit (INDEPENDENT_AMBULATORY_CARE_PROVIDER_SITE_OTHER): Payer: PPO

## 2020-07-27 DIAGNOSIS — Z23 Encounter for immunization: Secondary | ICD-10-CM

## 2020-07-31 ENCOUNTER — Other Ambulatory Visit: Payer: Self-pay | Admitting: *Deleted

## 2020-07-31 MED ORDER — TERAZOSIN HCL 1 MG PO CAPS: 1.0000 mg | ORAL_CAPSULE | Freq: Two times a day (BID) | ORAL | 0 refills | Status: DC

## 2020-07-31 MED ORDER — TERAZOSIN HCL 2 MG PO CAPS: 2.0000 mg | ORAL_CAPSULE | Freq: Every day | ORAL | 0 refills | Status: DC

## 2020-09-03 ENCOUNTER — Ambulatory Visit (INDEPENDENT_AMBULATORY_CARE_PROVIDER_SITE_OTHER): Payer: PPO | Admitting: Internal Medicine

## 2020-09-03 ENCOUNTER — Encounter: Payer: Self-pay | Admitting: Internal Medicine

## 2020-09-03 ENCOUNTER — Other Ambulatory Visit: Payer: Self-pay | Admitting: Family Medicine

## 2020-09-03 ENCOUNTER — Other Ambulatory Visit: Payer: Self-pay

## 2020-09-03 VITALS — BP 154/93 | HR 63 | Temp 98.2°F | Resp 18 | Ht 70.0 in | Wt 220.1 lb

## 2020-09-03 DIAGNOSIS — N183 Chronic kidney disease, stage 3 unspecified: Secondary | ICD-10-CM

## 2020-09-03 DIAGNOSIS — M21619 Bunion of unspecified foot: Secondary | ICD-10-CM

## 2020-09-03 DIAGNOSIS — I10 Essential (primary) hypertension: Secondary | ICD-10-CM

## 2020-09-03 MED ORDER — TRAMADOL HCL 50 MG PO TABS: 50.0000 mg | ORAL_TABLET | Freq: Two times a day (BID) | ORAL | 0 refills | Status: AC | PRN

## 2020-09-03 NOTE — Assessment & Plan Note (Addendum)
Swelling of the left > right bunion (around 1st MTP), no signs of infection or drainage Tramadol and Tylenol PRN Avoid NSAIDs due to CKD Referral to Podiatry

## 2020-09-03 NOTE — Progress Notes (Signed)
Established Patient Office Visit  Subjective:  Patient ID: [name] [name], male    DOB: July 19, 1941  Age: 79 y.o. MRN: [id number]  CC:  Chief Complaint  Patient presents with   Acute Visit    pt is having trouble out of feet was on his feet making stew saturday and ever since has been having trouble out of his feet thinks it could be the bunions on his feet has not slept in the last two days     HPI DEMARIS [name] is a 79 year old male with PMH of HTN, CAD, BPH, CKD stage 3b, and HLD presents for evalution of b/l feet pain.  He states that he has severe pain and swelling around left big toe, which is around 6-8/10, constant, worse with wearing shoes and walking and better with rest. He has not taken any medications yet. He denies any recent injury, drainage, fever, chills.  His BP is elevated, likely in the setting of acute pain. Takes medications regularly. Patient denies headache, dizziness, chest pain, dyspnea or palpitations.   Past Medical History:  Diagnosis Date   Anemia, iron deficiency    Annual physical exam 09/01/2016   Cancer ([place])    skin cancers    Educated about COVID-19 virus infection 03/13/2019   ELECTROCARDIOGRAM, ABNORMAL 08/18/2010   Qualifier: Diagnosis of  By: Moshe Cipro MD, Margaret     GERD (gastroesophageal reflux disease)    H/O hiatal hernia    Headache    Hemorrhoids    and minimal polp   History of kidney stones    History of migraines childhood    Flared up in 2009, and have responded to depakote which was stopped in 9 months   Hx of colonic polyps 02/24/2007   Hyperlipidemia 2003   Hypertension 2003   Low sodium levels 02/20/2017   LUNG NODULE 08/18/2010   Qualifier: Diagnosis of  By: Moshe Cipro MD, Margaret  No need for radiologic f/u unless new symptoms develop    Lung nodules since 2009   CT scan advised in 08/2010   Neoplasm    dermal   Nonspecific abnormal findings on radiological and other examination of skull and  head 08/18/2010   Qualifier: Diagnosis of  By: Moshe Cipro MD, Margaret  Scan of 08/2010 showed no change over a 2 yr period, no need for further scanning.Pt is a non smoker, will not rept unless symptomatic    Overweight(278.02)     Past Surgical History:  Procedure Laterality Date   COLONOSCOPY  2008   Fields-mod IH, 56mm polyp sigmoid colon (disappeared w/ insufflation & could not be retrieved)   COLONOSCOPY  05/19/2012   Procedure: COLONOSCOPY;  Surgeon: Danie Binder, MD;  Location: [facility];  Service: Endoscopy;  Laterality: N/A;  8:30   CYSTOSCOPY W/ URETERAL STENT PLACEMENT Left 11/27/2016   Procedure: CYSTOSCOPY WITH RETROGRADE PYELOGRAM/URETERAL STENT PLACEMENT;  Surgeon: Bjorn Loser, MD;  Location: [facility];  Service: Urology;  Laterality: Left;   ESOPHAGOGASTRODUODENOSCOPY  01/05/2007   Fields-sm HH, benign SB biopsy   EXTRACORPOREAL SHOCK WAVE LITHOTRIPSY Left 12/08/2016   Procedure: LEFT EXTRACORPOREAL SHOCK WAVE LITHOTRIPSY (ESWL);  Surgeon: Festus Aloe, MD;  Location: [facility];  Service: Urology;  Laterality: Left;   GIVENS CAPSULE STUDY  01/20/2007   Fields-nothing to explain IDA, lymphectasias   LEFT HEART CATH AND CORONARY ANGIOGRAPHY N/A 06/15/2020   Procedure: LEFT HEART CATH AND CORONARY ANGIOGRAPHY;  Surgeon: Martinique, Peter M, MD;  Location: [facility];  Service:  Cardiovascular;  Laterality: N/A;   removal of skin cancer twice      Family History  Problem Relation Age of Onset   Heart defect Mother        pacemaker    Alzheimer's disease Mother 83       2011   Heart defect Brother        bradycardia    Heart failure Father    Asthma Father    Hypertension Daughter    Kidney Stones Daughter    Colon cancer Neg Hx     Social History   Socioeconomic History   Marital status: Married    Spouse name: brenda    Number of children: 1   Years of education: 10   Highest education level: 10th grade  Occupational History    Occupation: retired, 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  Tobacco Use   Smoking status: Former Smoker    Packs/day: 2.00    Years: 15.00    Pack years: 30.00    Types: Cigarettes    Quit date: 10/20/1978    Years since quitting: 41.9   Smokeless tobacco: Former Systems developer    Types: Chew    Quit date: 10/20/2004  Vaping Use   Vaping Use: Never used  Substance and Sexual Activity   Alcohol use: No   Drug use: No   Sexual activity: Yes  Other Topics Concern   Not on file  Social History Narrative   1 healthy daughter   Lives alone with wife and dog    Social Determinants of Health   Financial Resource Strain:    Difficulty of Paying Living Expenses: Not on file  Food Insecurity:    Worried About Charity fundraiser in the Last Year: Not on file   [employer] of Food in the Last Year: Not on file  Transportation Needs:    Lack of Transportation (Medical): Not on file   Lack of Transportation (Non-Medical): Not on file  Physical Activity:    Days of Exercise per Week: Not on file   Minutes of Exercise per Session: Not on file  Stress:    Feeling of Stress : Not on file  Social Connections:    Frequency of Communication with Friends and Family: Not on file   Frequency of Social Gatherings with Friends and Family: Not on file   Attends Religious Services: Not on file   Active Member of Clubs or Organizations: Not on file   Attends Archivist Meetings: Not on file   Marital Status: Not on file  Intimate Partner Violence:    Fear of Current or Ex-Partner: Not on file   Emotionally Abused: Not on file   Physically Abused: Not on file   Sexually Abused: Not on file    Outpatient Medications Prior to Visit  Medication Sig Dispense Refill   acetaminophen (TYLENOL) 325 MG tablet Take 650 mg by mouth as needed for mild pain.      aspirin (ASPIRIN LOW DOSE) 81 MG EC tablet Take 1 tablet (81 mg total) by mouth every morning. 30 tablet 12    hydrochlorothiazide (HYDRODIURIL) 25 MG tablet Take 1 tablet by mouth once daily 90 tablet 0   Krill Oil 300 MG CAPS Take 300 mg by mouth at bedtime.      loratadine (CLARITIN) 10 MG tablet Take 10 mg by mouth daily.     metoprolol succinate (TOPROL-XL) 50 MG 24 hr tablet TAKE 1 TABLET BY MOUTH  ONCE DAILY WITH  OR  IMMEDIATELY  FOLLOWING  A  MEAL (Patient taking differently: Take 50 mg by mouth daily. ) 90 tablet 0   Misc Natural Products (BLACK CHERRY CONCENTRATE PO) Take 1 Dose by mouth daily.      Multiple Vitamin (MULTIVITAMIN WITH MINERALS) TABS Take 1 tablet by mouth daily.     nitroGLYCERIN (NITROSTAT) 0.4 MG SL tablet Take 1 tab under your tongue while sitting If no relief of chest pain may repeat one tab every 5 minutes up to 3 tablets total over 15 minutes (Patient taking differently: Place 0.4 mg under the tongue every 5 (five) minutes as needed for chest pain. up to 3 tablets total over 15 minutes) 25 tablet 3   potassium chloride (KLOR-CON) 10 MEQ tablet TAKE 1  BY MOUTH ONCE DAILY 90 tablet 0   simvastatin (ZOCOR) 40 MG tablet TAKE 1 TABLET BY MOUTH AT BEDTIME (Patient taking differently: Take 40 mg by mouth at bedtime. ) 90 tablet 1   terazosin (HYTRIN) 1 MG capsule Take 1 capsule (1 mg total) by mouth 2 (two) times daily. 180 capsule 0   terazosin (HYTRIN) 2 MG capsule Take 1 capsule (2 mg total) by mouth at bedtime. 90 capsule 0   Facility-Administered Medications Prior to Visit  Medication Dose Route Frequency Provider Last Rate Last Admin   sodium chloride flush (NS) 0.9 % injection 3 mL  3 mL Intravenous Q12H Branch, Alphonse Guild, MD        Allergies  Allergen Reactions   Ace Inhibitors Cough    Family member not sure if he is allergic or not.    ROS Review of Systems  Constitutional: Negative for chills and fever.  HENT: Negative for congestion and sore throat.   Eyes: Negative for pain and discharge.  Respiratory: Negative for cough and shortness of breath.    Cardiovascular: Negative for chest pain and palpitations.  Gastrointestinal: Negative for constipation, diarrhea, nausea and vomiting.  Endocrine: Negative for polydipsia and polyuria.  Genitourinary: Negative for dysuria and hematuria.  Musculoskeletal: Positive for arthralgias (Around b/l 1st MTP). Negative for neck pain and neck stiffness.  Skin: Negative for rash.  Neurological: Negative for dizziness, weakness, numbness and headaches.  Psychiatric/Behavioral: Negative for agitation and behavioral problems.      Objective:    Physical Exam Vitals reviewed.  Constitutional:      General: He is not in acute distress.    Appearance: He is not diaphoretic.  HENT:     Head: Normocephalic and atraumatic.     Nose: Nose normal.     Mouth/Throat:     Mouth: Mucous membranes are moist.  Eyes:     General: No scleral icterus.    Extraocular Movements: Extraocular movements intact.     Pupils: Pupils are equal, round, and reactive to light.  Cardiovascular:     Rate and Rhythm: Normal rate and regular rhythm.     Heart sounds: Normal heart sounds. No murmur heard.   Pulmonary:     Breath sounds: Normal breath sounds. No wheezing or rales.  Abdominal:     Palpations: Abdomen is soft.     Tenderness: There is no abdominal tenderness.  Musculoskeletal:     Cervical back: Neck supple. No tenderness.     Right foot: Swelling and bunion present.     Left foot: Swelling and bunion present.     Comments: Left > Right bunion swelling.  Skin:    General: Skin is warm.  Findings: No rash.  Neurological:     General: No focal deficit present.     Mental Status: He is alert and oriented to person, place, and time.     Cranial Nerves: No cranial nerve deficit.     Sensory: No sensory deficit.  Psychiatric:        Mood and Affect: Mood normal.        Behavior: Behavior normal.     BP (!) 154/93 (BP Location: Right Arm, Patient Position: Sitting, Cuff Size: Normal)    Pulse 63     Temp 98.2 F (36.8 C) (Oral)    Resp 18    Ht 5\' 10"  (1.778 m)    Wt 220 lb 1.9 oz (99.8 kg)    SpO2 99%    BMI 31.58 kg/m  Wt Readings from Last 3 Encounters:  09/03/20 220 lb 1.9 oz (99.8 kg)  06/28/20 221 lb 12.8 oz (100.6 kg)  06/15/20 223 lb (101.2 kg)     Health Maintenance Due  Topic Date Due   Hepatitis C Screening  Never done    There are no preventive care reminders to display for this patient.  Lab Results  Component Value Date   TSH 3.07 09/28/2019   Lab Results  Component Value Date   WBC 6.0 06/13/2020   HGB 13.7 06/13/2020   HCT 42.5 06/13/2020   MCV 91.4 06/13/2020   PLT 178 06/13/2020   Lab Results  Component Value Date   NA 139 06/13/2020   K 3.6 06/13/2020   CO2 26 06/13/2020   GLUCOSE 93 06/13/2020   BUN 20 06/13/2020   CREATININE 1.73 (H) 06/13/2020   BILITOT 0.5 02/16/2020   ALKPHOS 47 02/17/2017   AST 23 02/16/2020   ALT 19 02/16/2020   PROT 7.1 02/16/2020   ALBUMIN 4.3 02/17/2017   CALCIUM 9.0 06/13/2020   ANIONGAP 10 06/13/2020   Lab Results  Component Value Date   CHOL 192 09/28/2019   Lab Results  Component Value Date   HDL 51 09/28/2019   Lab Results  Component Value Date   LDLCALC 115 (H) 09/28/2019   Lab Results  Component Value Date   TRIG 138 09/28/2019   Lab Results  Component Value Date   CHOLHDL 3.8 09/28/2019   Lab Results  Component Value Date   HGBA1C 5.5 02/16/2020      Assessment & Plan:   Problem List Items Addressed This Visit      Cardiovascular and Mediastinum   Essential hypertension    Likely elevated due to pain Continue HCTZ and Metoprolol DASH diet advised        Musculoskeletal and Integument   Bunion of unspecified foot - Primary    Swelling of the left > right bunion (around 1st MTP), no signs of infection or drainage Tramadol and Tylenol PRN Avoid NSAIDs due to CKD Referral to Podiatry      Relevant Medications   traMADol (ULTRAM) 50 MG tablet   Other Relevant Orders     Ambulatory referral to Podiatry     Genitourinary   CKD (chronic kidney disease) stage 3, GFR 30-59 ml/min (HCC)    Last CMP reviewed Stable stage 3b CKD Avoid nephrotoxic agents including NSAIDs         Meds ordered this encounter  Medications   traMADol (ULTRAM) 50 MG tablet    Sig: Take 1 tablet (50 mg total) by mouth 2 (two) times daily as needed for up to 8 days for moderate  pain or severe pain.    Dispense:  15 tablet    Refill:  0    Follow-up: Return if symptoms worsen or fail to improve.    Lindell Spar, MD

## 2020-09-03 NOTE — Assessment & Plan Note (Signed)
Likely elevated due to pain Continue HCTZ and Metoprolol DASH diet advised

## 2020-09-03 NOTE — Assessment & Plan Note (Signed)
Last CMP reviewed Stable stage 3b CKD Avoid nephrotoxic agents including NSAIDs

## 2020-09-03 NOTE — Patient Instructions (Signed)
Please wear low-heeled shoes with a wide toe box. Okay to take Tylenol every 8 hours as needed for pain.  Please keep area clean and dry. Please avoid walking bear foot to avoid injuries or infection.  You are being referred to Podiatrist for evaluation of bunions.

## 2020-09-04 DIAGNOSIS — M25572 Pain in left ankle and joints of left foot: Secondary | ICD-10-CM | POA: Diagnosis not present

## 2020-09-17 ENCOUNTER — Other Ambulatory Visit: Payer: Self-pay

## 2020-09-17 ENCOUNTER — Ambulatory Visit: Payer: PPO | Admitting: Podiatry

## 2020-09-17 ENCOUNTER — Ambulatory Visit (INDEPENDENT_AMBULATORY_CARE_PROVIDER_SITE_OTHER): Payer: PPO

## 2020-09-17 DIAGNOSIS — M21611 Bunion of right foot: Secondary | ICD-10-CM

## 2020-09-17 DIAGNOSIS — M21612 Bunion of left foot: Secondary | ICD-10-CM

## 2020-09-17 DIAGNOSIS — M205X2 Other deformities of toe(s) (acquired), left foot: Secondary | ICD-10-CM | POA: Diagnosis not present

## 2020-09-17 DIAGNOSIS — M205X1 Other deformities of toe(s) (acquired), right foot: Secondary | ICD-10-CM

## 2020-09-17 MED ORDER — SIMVASTATIN 40 MG PO TABS
40.0000 mg | ORAL_TABLET | Freq: Every day | ORAL | 1 refills | Status: DC
Start: 2020-09-17 — End: 2021-03-11

## 2020-09-17 NOTE — Progress Notes (Signed)
Subjective: 79 y.o. male presents today for evaluation of symptomatic pain and tenderness that began approximately 2 weeks ago to the bilateral feet left greater than right. Patient states that he has had a longstanding history of bunions for several years however recently a few weeks ago it began to become very symptomatic and painful. Patient denies injury or trauma to the area. He states that he most recently went to [facility] and received an injection which helped significantly. He presents for further treatment evaluation. He is currently asymptomatic   Past Medical History:  Diagnosis Date  . Anemia, iron deficiency   . Annual physical exam 09/01/2016  . Cancer ([place])    skin cancers   . Educated about COVID-19 virus infection 03/13/2019  . ELECTROCARDIOGRAM, ABNORMAL 08/18/2010   Qualifier: Diagnosis of  By: Moshe Cipro MD, Joycelyn Schmid    . GERD (gastroesophageal reflux disease)   . H/O hiatal hernia   . Headache   . Hemorrhoids    and minimal polp  . History of kidney stones   . History of migraines childhood    Flared up in 2009, and have responded to depakote which was stopped in 9 months  . Hx of colonic polyps 02/24/2007  . Hyperlipidemia 2003  . Hypertension 2003  . Low sodium levels 02/20/2017  . LUNG NODULE 08/18/2010   Qualifier: Diagnosis of  By: Moshe Cipro MD, Margaret  No need for radiologic f/u unless new symptoms develop   . Lung nodules since 2009   CT scan advised in 08/2010  . Neoplasm    dermal  . Nonspecific abnormal findings on radiological and other examination of skull and head 08/18/2010   Qualifier: Diagnosis of  By: Moshe Cipro MD, Arnell Asal of 08/2010 showed no change over a 2 yr period, no need for further scanning.Pt is a non smoker, will not rept unless symptomatic   . Overweight(278.02)       Objective: Physical Exam General: The patient is alert and oriented x3 in no acute distress.  Dermatology: Skin is cool, dry and supple bilateral lower  extremities. Negative for open lesions or macerations.  Vascular: Palpable pedal pulses bilaterally. No edema or erythema noted. Capillary refill within normal limits.  Neurological: Epicritic and protective threshold grossly intact bilaterally.   Musculoskeletal Exam: Clinical evidence of bunion deformity noted to the respective foot. There is moderate pain on palpation range of motion of the first MPJ. Lateral deviation of the hallux noted consistent with hallux abductovalgus.  Radiographic Exam: Increased intermetatarsal angle greater than 15 with a hallux abductus angle greater than 30 noted on AP view. Moderate degenerative changes noted within the first MPJ.  Assessment: 1. HAV w/ bunion deformity bilateral 2. Hallux limitus bilateral   Plan of Care:  1. Patient was evaluated. X-Rays reviewed. 2. Today the patient is asymptomatic and feeling well. Recommend good to conservative treatment including good supportive shoes that do not irritate his bunions. If they do become symptomatic recommend that he returns to the office for further treatment and evaluation. 3. Return to clinic as needed      Edrick Kins, DPM [facility]  Dr. Edrick Kins, [address]                                        [place], [place] [address]  Office [phone]  Fax [phone]

## 2020-09-26 ENCOUNTER — Encounter: Payer: Self-pay | Admitting: Family Medicine

## 2020-09-26 ENCOUNTER — Ambulatory Visit (INDEPENDENT_AMBULATORY_CARE_PROVIDER_SITE_OTHER): Payer: PPO | Admitting: Family Medicine

## 2020-09-26 VITALS — BP 154/93 | Ht 70.0 in | Wt 222.0 lb

## 2020-09-26 DIAGNOSIS — Z0000 Encounter for general adult medical examination without abnormal findings: Secondary | ICD-10-CM

## 2020-09-26 NOTE — Progress Notes (Addendum)
Subjective:   [name] [name] is a 79 y.o. male who presents for Medicare Annual/Subsequent preventive examination.  Participants: Nurse for intake and work up; Patient and Provider for Visit and Wrap up  Method of visit: Telephone  Location of Patient: Home Location of Provider: Office Consent was obtain for visit over the telephone. Services rendered by provider: Visit was performed via telephone   I verified that I am speaking with the correct person using two identifiers.  Review of Systems Cardiac Risk Factors include: advanced age (>16men, >59 women);obesity (BMI >30kg/m2)     Objective:    Today's Vitals   09/26/20 0812  BP: (!) 154/93  Weight: 222 lb (100.7 kg)  Height: 5\' 10"  (1.778 m)  PainSc: 0-No pain   Body mass index is 31.85 kg/m.  Advanced Directives 09/26/2020 06/15/2020 05/06/2020 09/13/2018 06/24/2017 12/08/2016 12/05/2016  Does Patient Have a Medical Advance Directive? Yes Yes No No Yes Yes Yes  Type of Paramedic of [place];Living will [place];Living will - - [place];Living will Living will Living will  Does patient want to make changes to medical advance directive? - - - - - No - Patient declined No - Patient declined  Copy of [place] in Chart? No - copy requested No - copy requested - - No - copy requested No - copy requested No - copy requested  Would patient like information on creating a medical advance directive? - - - Yes (ED - Information included in AVS) - No - Patient declined No - Patient declined  Pre-existing out of facility DNR order (yellow form or pink MOST form) - - - - - - -    Current Medications (verified) Outpatient Encounter Medications as of 09/26/2020  Medication Sig  . acetaminophen (TYLENOL) 325 MG tablet Take 650 mg by mouth as needed for mild pain.   Marland Kitchen aspirin (ASPIRIN LOW DOSE) 81 MG EC tablet Take 1 tablet (81 mg total) by mouth every  morning.  . Colchicine 0.6 MG CAPS Take by mouth.  . hydrochlorothiazide (HYDRODIURIL) 25 MG tablet Take 1 tablet by mouth once daily  . Krill Oil 300 MG CAPS Take 300 mg by mouth at bedtime.   Marland Kitchen loratadine (CLARITIN) 10 MG tablet Take 10 mg by mouth daily.  . metoprolol succinate (TOPROL-XL) 50 MG 24 hr tablet TAKE 1 TABLET BY MOUTH ONCE DAILY WITH  OR  IMMEDIATELY  FOLLOWING  A  MEAL (Patient taking differently: Take 50 mg by mouth daily. )  . Misc Natural Products (BLACK CHERRY CONCENTRATE PO) Take 1 Dose by mouth daily.   . Multiple Vitamin (MULTIVITAMIN WITH MINERALS) TABS Take 1 tablet by mouth daily.  . nitroGLYCERIN (NITROSTAT) 0.4 MG SL tablet Take 1 tab under your tongue while sitting If no relief of chest pain may repeat one tab every 5 minutes up to 3 tablets total over 15 minutes (Patient taking differently: Place 0.4 mg under the tongue every 5 (five) minutes as needed for chest pain. up to 3 tablets total over 15 minutes)  . potassium chloride (KLOR-CON) 10 MEQ tablet TAKE 1  BY MOUTH ONCE DAILY  . predniSONE (DELTASONE) 10 MG tablet Take 40 mg by mouth daily.  . simvastatin (ZOCOR) 40 MG tablet Take 1 tablet (40 mg total) by mouth at bedtime.  Marland Kitchen terazosin (HYTRIN) 1 MG capsule Take 1 capsule (1 mg total) by mouth 2 (two) times daily.  Marland Kitchen terazosin (HYTRIN) 2  MG capsule Take 1 capsule (2 mg total) by mouth at bedtime.   Facility-Administered Encounter Medications as of 09/26/2020  Medication  . sodium chloride flush (NS) 0.9 % injection 3 mL    Allergies (verified) Ace inhibitors   History: Past Medical History:  Diagnosis Date  . Anemia, iron deficiency   . Annual physical exam 09/01/2016  . Cancer ([place])    skin cancers   . Educated about COVID-19 virus infection 03/13/2019  . ELECTROCARDIOGRAM, ABNORMAL 08/18/2010   Qualifier: Diagnosis of  By: Moshe Cipro MD, Joycelyn Schmid    . GERD (gastroesophageal reflux disease)   . H/O hiatal hernia   . Headache   . Hemorrhoids    and  minimal polp  . History of kidney stones   . History of migraines childhood    Flared up in 2009, and have responded to depakote which was stopped in 9 months  . Hx of colonic polyps 02/24/2007  . Hyperlipidemia 2003  . Hypertension 2003  . Low sodium levels 02/20/2017  . LUNG NODULE 08/18/2010   Qualifier: Diagnosis of  By: Moshe Cipro MD, Margaret  No need for radiologic f/u unless new symptoms develop   . Lung nodules since 2009   CT scan advised in 08/2010  . Neoplasm    dermal  . Nonspecific abnormal findings on radiological and other examination of skull and head 08/18/2010   Qualifier: Diagnosis of  By: Moshe Cipro MD, Arnell Asal of 08/2010 showed no change over a 2 yr period, no need for further scanning.Pt is a non smoker, will not rept unless symptomatic   . Overweight(278.02)    Past Surgical History:  Procedure Laterality Date  . COLONOSCOPY  2008   Fields-mod IH, 73mm polyp sigmoid colon (disappeared w/ insufflation & could not be retrieved)  . COLONOSCOPY  05/19/2012   Procedure: COLONOSCOPY;  Surgeon: Danie Binder, MD;  [facility];  Service: Endoscopy;  Laterality: N/A;  8:30  . CYSTOSCOPY W/ URETERAL STENT PLACEMENT Left 11/27/2016   Procedure: CYSTOSCOPY WITH RETROGRADE PYELOGRAM/URETERAL STENT PLACEMENT;  Surgeon: Bjorn Loser, MD;  Location: [facility];  Service: Urology;  Laterality: Left;  . ESOPHAGOGASTRODUODENOSCOPY  01/05/2007   Fields-sm HH, benign SB biopsy  . EXTRACORPOREAL SHOCK WAVE LITHOTRIPSY Left 12/08/2016   Procedure: LEFT EXTRACORPOREAL SHOCK WAVE LITHOTRIPSY (ESWL);  Surgeon: Festus Aloe, MD;  Location: [facility];  Service: Urology;  Laterality: Left;  . GIVENS CAPSULE STUDY  01/20/2007   Fields-nothing to explain IDA, lymphectasias  . LEFT HEART CATH AND CORONARY ANGIOGRAPHY N/A 06/15/2020   Procedure: LEFT HEART CATH AND CORONARY ANGIOGRAPHY;  Surgeon: Martinique, Peter M, MD;  Location: [facility];  Service: Cardiovascular;  Laterality: N/A;   . removal of skin cancer twice     Family History  Problem Relation Age of Onset  . Heart defect Mother        pacemaker   . Alzheimer's disease Mother 61       2011  . Heart defect Brother        bradycardia   . Heart failure Father   . Asthma Father   . Hypertension Daughter   . Kidney Stones Daughter   . Colon cancer Neg Hx    Social History   Socioeconomic History  . Marital status: Married    Spouse name: brenda   . Number of children: 1  . Years of education: 98  . Highest education level: 10th grade  Occupational History  . 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 Smoking status: Former Smoker    Packs/day: 2.00    Years: 15.00    Pack years: 30.00    Types: Cigarettes    Quit date: 10/20/1978    Years since quitting: 41.9  . Smokeless tobacco: Former Systems developer    Types: [place] date: 10/20/2004  Vaping Use  . Vaping Use: Never used  Substance and Sexual Activity  . Alcohol use: No  . Drug use: No  . Sexual activity: Yes  Other Topics Concern  . Not on file  Social History Narrative   1 healthy daughter   Lives alone with wife and dog    Social Determinants of Health   Financial Resource Strain: Low Risk   . Difficulty of Paying Living Expenses: Not hard at all  Food Insecurity: No Food Insecurity  . Worried About Charity fundraiser in the Last Year: Never true  . Ran Out of Food in the Last Year: Never true  Transportation Needs: No Transportation Needs  . Lack of Transportation (Medical): No  . Lack of Transportation (Non-Medical): No  Physical Activity: Inactive  . Days of Exercise per Week: 0 days  . Minutes of Exercise per Session: 0 min  Stress: No Stress Concern Present  . Feeling of Stress : Not at all  Social Connections: Moderately Integrated  . Frequency of Communication with Friends and Family: More than three times a week  . Frequency of Social Gatherings with Friends and Family: Twice a week   . Attends Religious Services: Never  . Active Member of Clubs or Organizations: Yes  . Attends Archivist Meetings: More than 4 times per year  . Marital Status: Married    Tobacco Counseling Counseling given: Yes   Clinical Intake:  Pre-visit preparation completed: Yes  Pain : No/denies pain Pain Score: 0-No pain     BMI - recorded: 31.58 Nutritional Status: BMI > 30  Obese Nutritional Risks: None Diabetes: No  How often do you need to have someone help you when you read instructions, pamphlets, or other written materials from your doctor or pharmacy?: 1 - Never What is the last grade level you completed in school?: 5  Diabetic? no  Interpreter Needed?: No  Information entered by :: Laretta Bolster, LPN   Activities of Daily Living In your present state of health, do you have any difficulty performing the following activities: 09/26/2020  Hearing? N  Vision? N  Difficulty concentrating or making decisions? N  Walking or climbing stairs? N  Dressing or bathing? N  Doing errands, shopping? N  Preparing Food and eating ? N  Using the Toilet? N  In the past six months, have you accidently leaked urine? N  Do you have problems with loss of bowel control? N  Managing your Medications? N  Managing your Finances? N  Housekeeping or managing your Housekeeping? N  Some recent data might be hidden    Patient Care Team: Fayrene Helper, MD as PCP - [facility], Marga Melnick, MD (Inactive) (Gastroenterology) Sandford Craze, MD as Referring Physician (Dermatology)  Indicate any recent Medical Services you may have received from other than [facility] providers in the past year (date may be approximate).     Assessment:   This is a routine wellness examination for Hilary.  Hearing/Vision screen No exam data present  Dietary issues and exercise activities discussed: Current Exercise Habits: The patient does not  participate in regular exercise at present,  Exercise limited by: None identified  Goals    . DIET - INCREASE WATER INTAKE    . Exercise 3x per week (30 min per time)     Recommend starting a routine exercise program at least 3 days a week for 30-45 minutes at a time as tolerated.        Depression Screen PHQ 2/9 Scores 09/26/2020 09/26/2020 09/03/2020 05/04/2020 09/26/2019 03/08/2019 09/13/2018  PHQ - 2 Score 0 0 0 0 0 0 0  PHQ- 9 Score - - - - - - -    Fall Risk Fall Risk  09/26/2020 09/03/2020 05/04/2020 02/16/2020 10/05/2019  Falls in the past year? 0 0 0 0 0  Number falls in past yr: 0 0 0 0 0  Injury with Fall? 0 0 0 0 0  Risk for fall due to : No Fall Risks No Fall Risks No Fall Risks - -  Follow up Falls evaluation completed Falls evaluation completed Falls evaluation completed - -    FALL RISK PREVENTION PERTAINING TO THE HOME:  Any stairs in or around the home? Yes  If so, are there any without handrails? No  Home free of loose throw rugs in walkways, pet beds, electrical cords, etc? Yes  Adequate lighting in your home to reduce risk of falls? Yes   ASSISTIVE DEVICES UTILIZED TO PREVENT FALLS:  Life alert? No  Use of a cane, walker or w/c? No  Grab bars in the bathroom? Yes  Shower chair or bench in shower? No  Elevated toilet seat or a handicapped toilet? No   TIMED UP AND GO:  Was the test performed? No .  Length of time to ambulate n/a   Cognitive Function:     6CIT Screen 09/26/2020 09/26/2019 09/13/2018 06/24/2017 10/10/2016  What Year? 0 points 0 points 0 points 0 points 0 points  What month? 0 points 0 points 0 points 0 points 0 points  What time? 0 points 0 points 0 points 0 points 0 points  Count back from 20 0 points 0 points 0 points 0 points 0 points  Months in reverse 0 points 0 points 0 points 0 points 0 points  Repeat phrase 0 points 0 points 2 points 0 points 0 points  Total Score 0 0 2 0 0    Immunizations Immunization History  Administered Date(s) Administered  . Fluad Quad(high Dose  65+) 07/25/2019, 07/27/2020  . Influenza Whole 08/12/2010, 08/11/2011  . Influenza, High Dose Seasonal PF 07/28/2018  . Influenza,inj,Quad PF,6+ Mos 07/27/2013, 07/27/2014, 08/07/2015, 09/01/2016, 08/26/2017  . Pneumococcal Conjugate-13 05/01/2014  . Pneumococcal Polysaccharide-23 04/29/2013  . Tdap 08/11/2011  . Unspecified SARS-COV-2 Vaccination 11/01/2019, 12/02/2019  . Zoster 04/10/2014    TDAP status: Up to date  Flu Vaccine status: Up to date  Pneumococcal vaccine status: Up to date  Covid-19 vaccine status: Completed vaccines  Qualifies for Shingles Vaccine? Yes   Zostavax completed No   Shingrix Completed?: No.    Education has been provided regarding the importance of this vaccine. Patient has been advised to call insurance company to determine out of pocket expense if they have not yet received this vaccine. Advised may also receive vaccine at local pharmacy or Health Dept. Verbalized acceptance and understanding.  Screening Tests Health Maintenance  Topic Date Due  . Hepatitis C Screening  Never done  . TETANUS/TDAP  08/10/2021  . INFLUENZA VACCINE  Completed  . COVID-19 Vaccine  Completed  . PNA  vac Low Risk Adult  Completed    Health Maintenance  Health Maintenance Due  Topic Date Due  . Hepatitis C Screening  Never done    Colorectal cancer screening: Type of screening: Colonoscopy. Completed 05/19/12. Repeat every 10 years  Lung Cancer Screening: (Low Dose CT Chest recommended if Age 37-80 years, 30 pack-year currently smoking OR have quit w/in 15years.) does not qualify.   Lung Cancer Screening Referral: n/a  Additional Screening:  Hepatitis C Screening: does not qualify  Vision Screening: Recommended annual ophthalmology exams for early detection of glaucoma and other disorders of the eye. Is the patient up to date with their annual eye exam?  Yes  Who is the provider or what is the name of the office in which the patient attends annual eye exams?  [facility] in [place] If pt is not established with a provider, would they like to be referred to a provider to establish care? n/a.   Dental Screening: Recommended annual dental exams for proper oral hygiene  Community Resource Referral / Chronic Care Management: CRR required this visit?  No   CCM required this visit?  No      Plan:     1. Encounter for Medicare annual wellness exam  I have personally reviewed and noted the following in the patient's chart:   . Medical and social history . Use of alcohol, tobacco or illicit drugs  . Current medications and supplements . Functional ability and status . Nutritional status . Physical activity . Advanced directives . List of other physicians . Hospitalizations, surgeries, and ER visits in previous 12 months . Vitals . Screenings to include cognitive, depression, and falls . Referrals and appointments  In addition, I have reviewed and discussed with patient certain preventive protocols, quality metrics, and best practice recommendations. A written personalized care plan for preventive services as well as general preventive health recommendations were provided to patient.     Perlie Mayo, NP   09/26/2020   Nurse Notes: AWV conducted by nurse in office by phone. Patient gave consent to telehealth visit via audio. Patient at home during visit. Provider here in the office. Visit took 25 minutes to complete.  Agreed with above documentation. No additional questions or concerns from patient.

## 2020-09-26 NOTE — Patient Instructions (Addendum)
[name] [name] , Thank you for taking time to come for your Medicare Wellness Visit. I appreciate your ongoing commitment to your health goals. Please review the following plan we discussed and let me know if I can assist you in the future.   Screening recommendations/referrals: Colonoscopy: 2023 Recommended yearly ophthalmology/optometry visit for glaucoma screening and checkup Recommended yearly dental visit for hygiene and checkup  Vaccinations: Influenza vaccine: Fall 2022 Pneumococcal vaccine: Complete Tdap vaccine: 08/10/21 Shingles vaccine: Delc[place]   Advanced directives: POA and Living Will  Conditions/risks identified: None  Next appointment: 10/08/20 @ 1:30 pm  Preventive Care 19 Years and Older, Male Preventive care refers to lifestyle choices and visits with your health care provider that can promote health and wellness. What does preventive care include?  A yearly physical exam. This is also called an annual well check.  Dental exams once or twice a year.  Routine eye exams. Ask your health care provider how often you should have your eyes checked.  Personal lifestyle choices, including:  Daily care of your teeth and gums.  Regular physical activity.  Eating a healthy diet.  Avoiding tobacco and drug use.  Limiting alcohol use.  Practicing safe sex.  Taking low doses of aspirin every day.  Taking vitamin and mineral supplements as recommended by your health care provider. What happens during an annual well check? The services and screenings done by your health care provider during your annual well check will depend on your age, overall health, lifestyle risk factors, and family history of disease. Counseling  Your health care provider may ask you questions about your:  Alcohol use.  Tobacco use.  Drug use.  Emotional well-being.  Home and relationship well-being.  Sexual activity.  Eating habits.  History of falls.  Memory and ability to  understand (cognition).  Work and work Statistician. Screening  You may have the following tests or measurements:  Height, weight, and BMI.  Blood pressure.  Lipid and cholesterol levels. These may be checked every 5 years, or more frequently if you are over 10 years old.  Skin check.  Lung cancer screening. You may have this screening every year starting at age 54 if you have a 30-pack-year history of smoking and currently smoke or have quit within the past 15 years.  Fecal occult blood test (FOBT) of the stool. You may have this test every year starting at age 10.  Flexible sigmoidoscopy or colonoscopy. You may have a sigmoidoscopy every 5 years or a colonoscopy every 10 years starting at age 43.  Prostate cancer screening. Recommendations will vary depending on your family history and other risks.  Hepatitis C blood test.  Hepatitis B blood test.  Sexually transmitted disease (STD) testing.  Diabetes screening. This is done by checking your blood sugar (glucose) after you have not eaten for a while (fasting). You may have this done every 1-3 years.  Abdominal aortic aneurysm (AAA) screening. You may need this if you are a current or former smoker.  Osteoporosis. You may be screened starting at age 69 if you are at high risk. Talk with your health care provider about your test results, treatment options, and if necessary, the need for more tests. Vaccines  Your health care provider may recommend certain vaccines, such as:  Influenza vaccine. This is recommended every year.  Tetanus, diphtheria, and acellular pertussis (Tdap, Td) vaccine. You may need a Td booster every 10 years.  Zoster vaccine. You may need this after age 34.  Pneumococcal  13-valent conjugate (PCV13) vaccine. One dose is recommended after age 67.  Pneumococcal polysaccharide (PPSV23) vaccine. One dose is recommended after age 58. Talk to your health care provider about which screenings and vaccines you  need and how often you need them. This information is not intended to replace advice given to you by your health care provider. Make sure you discuss any questions you have with your health care provider. Document Released: 11/02/2015 Document Revised: 06/25/2016 Document Reviewed: 08/07/2015 Elsevier Interactive Patient Education  2017 [place] Prevention in the Home Falls can cause injuries. They can happen to people of all ages. There are many things you can do to make your home safe and to help prevent falls. What can I do on the outside of my home?  Regularly fix the edges of walkways and driveways and fix any cracks.  Remove anything that might make you trip as you walk through a door, such as a raised step or threshold.  Trim any bushes or trees on the path to your home.  Use bright outdoor lighting.  Clear any walking paths of anything that might make someone trip, such as rocks or tools.  Regularly check to see if handrails are loose or broken. Make sure that both sides of any steps have handrails.  Any raised decks and porches should have guardrails on the edges.  Have any leaves, snow, or ice cleared regularly.  Use sand or salt on walking paths during winter.  Clean up any spills in your garage right away. This includes oil or grease spills. What can I do in the bathroom?  Use night lights.  Install grab bars by the toilet and in the tub and shower. Do not use towel bars as grab bars.  Use non-skid mats or decals in the tub or shower.  If you need to sit down in the shower, use a plastic, non-slip stool.  Keep the floor dry. Clean up any water that spills on the floor as soon as it happens.  Remove soap buildup in the tub or shower regularly.  Attach bath mats securely with double-sided non-slip rug tape.  Do not have throw rugs and other things on the floor that can make you trip. What can I do in the bedroom?  Use night lights.  Make sure  that you have a light by your bed that is easy to reach.  Do not use any sheets or blankets that are too big for your bed. They should not hang down onto the floor.  Have a firm chair that has side arms. You can use this for support while you get dressed.  Do not have throw rugs and other things on the floor that can make you trip. What can I do in the kitchen?  Clean up any spills right away.  Avoid walking on wet floors.  Keep items that you use a lot in easy-to-reach places.  If you need to reach something above you, use a strong step stool that has a grab bar.  Keep electrical cords out of the way.  Do not use floor polish or wax that makes floors slippery. If you must use wax, use non-skid floor wax.  Do not have throw rugs and other things on the floor that can make you trip. What can I do with my stairs?  Do not leave any items on the stairs.  Make sure that there are handrails on both sides of the stairs and use them. Fix  handrails that are broken or loose. Make sure that handrails are as long as the stairways.  Check any carpeting to make sure that it is firmly attached to the stairs. Fix any carpet that is loose or worn.  Avoid having throw rugs at the top or bottom of the stairs. If you do have throw rugs, attach them to the floor with carpet tape.  Make sure that you have a light switch at the top of the stairs and the bottom of the stairs. If you do not have them, ask someone to add them for you. What else can I do to help prevent falls?  Wear shoes that:  Do not have high heels.  Have rubber bottoms.  Are comfortable and fit you well.  Are closed at the toe. Do not wear sandals.  If you use a stepladder:  Make sure that it is fully opened. Do not climb a closed stepladder.  Make sure that both sides of the stepladder are locked into place.  Ask someone to hold it for you, if possible.  Clearly mark and make sure that you can see:  Any grab bars or  handrails.  First and last steps.  Where the edge of each step is.  Use tools that help you move around (mobility aids) if they are needed. These include:  Canes.  Walkers.  Scooters.  Crutches.  Turn on the lights when you go into a dark area. Replace any light bulbs as soon as they burn out.  Set up your furniture so you have a clear path. Avoid moving your furniture around.  If any of your floors are uneven, fix them.  If there are any pets around you, be aware of where they are.  Review your medicines with your doctor. Some medicines can make you feel dizzy. This can increase your chance of falling. Ask your doctor what other things that you can do to help prevent falls. This information is not intended to replace advice given to you by your health care provider. Make sure you discuss any questions you have with your health care provider. Document Released: 08/02/2009 Document Revised: 03/13/2016 Document Reviewed: 11/10/2014 Elsevier Interactive Patient Education  2017 [employer].

## 2020-10-02 ENCOUNTER — Other Ambulatory Visit: Payer: Self-pay | Admitting: Family Medicine

## 2020-10-03 ENCOUNTER — Other Ambulatory Visit: Payer: Self-pay

## 2020-10-03 DIAGNOSIS — I10 Essential (primary) hypertension: Secondary | ICD-10-CM | POA: Diagnosis not present

## 2020-10-03 DIAGNOSIS — R3912 Poor urinary stream: Secondary | ICD-10-CM | POA: Diagnosis not present

## 2020-10-03 DIAGNOSIS — N401 Enlarged prostate with lower urinary tract symptoms: Secondary | ICD-10-CM | POA: Diagnosis not present

## 2020-10-03 DIAGNOSIS — E782 Mixed hyperlipidemia: Secondary | ICD-10-CM | POA: Diagnosis not present

## 2020-10-04 LAB — CMP14+EGFR
ALT: 19 IU/L (ref 0–44)
AST: 21 IU/L (ref 0–40)
Albumin/Globulin Ratio: 1.8 (ref 1.2–2.2)
Albumin: 4.4 g/dL (ref 3.7–4.7)
Alkaline Phosphatase: 59 IU/L (ref 44–121)
BUN/Creatinine Ratio: 9 — ABNORMAL LOW (ref 10–24)
BUN: 17 mg/dL (ref 8–27)
Bilirubin Total: 0.5 mg/dL (ref 0.0–1.2)
CO2: 24 mmol/L (ref 20–29)
Calcium: 9.3 mg/dL (ref 8.6–10.2)
Chloride: 100 mmol/L (ref 96–106)
Creatinine, Ser: 1.87 mg/dL — ABNORMAL HIGH (ref 0.76–1.27)
GFR calc Af Amer: 39 mL/min/{1.73_m2} — ABNORMAL LOW (ref >59)
GFR calc non Af Amer: 34 mL/min/{1.73_m2} — ABNORMAL LOW (ref >59)
Globulin, Total: 2.5 g/dL (ref 1.5–4.5)
Glucose: 90 mg/dL (ref 65–99)
Potassium: 4 mmol/L (ref 3.5–5.2)
Sodium: 141 mmol/L (ref 134–144)
Total Protein: 6.9 g/dL (ref 6.0–8.5)

## 2020-10-04 LAB — LIPID PANEL
Chol/HDL Ratio: 3.8 ratio (ref 0.0–5.0)
Cholesterol, Total: 199 mg/dL (ref 100–199)
HDL: 53 mg/dL (ref >39)
LDL Chol Calc (NIH): 118 mg/dL — ABNORMAL HIGH (ref 0–99)
Triglycerides: 160 mg/dL — ABNORMAL HIGH (ref 0–149)
VLDL Cholesterol Cal: 28 mg/dL (ref 5–40)

## 2020-10-04 LAB — CBC
Hematocrit: 40.1 % (ref 37.5–51.0)
Hemoglobin: 13.5 g/dL (ref 13.0–17.7)
MCH: 29.3 pg (ref 26.6–33.0)
MCHC: 33.7 g/dL (ref 31.5–35.7)
MCV: 87 fL (ref 79–97)
Platelets: 181 10*3/uL (ref 150–450)
RBC: 4.6 x10E6/uL (ref 4.14–5.80)
RDW: 13.6 % (ref 11.6–15.4)
WBC: 4.5 10*3/uL (ref 3.4–10.8)

## 2020-10-04 LAB — PSA: Prostate Specific Ag, Serum: 1.3 ng/mL (ref 0.0–4.0)

## 2020-10-08 ENCOUNTER — Encounter: Payer: PPO | Admitting: Family Medicine

## 2020-10-11 ENCOUNTER — Encounter: Payer: Self-pay | Admitting: Internal Medicine

## 2020-10-11 ENCOUNTER — Other Ambulatory Visit: Payer: Self-pay

## 2020-10-11 ENCOUNTER — Ambulatory Visit (INDEPENDENT_AMBULATORY_CARE_PROVIDER_SITE_OTHER): Payer: PPO | Admitting: Internal Medicine

## 2020-10-11 VITALS — BP 145/89 | HR 91 | Temp 98.0°F | Resp 18 | Ht 70.0 in | Wt 224.1 lb

## 2020-10-11 DIAGNOSIS — Z0000 Encounter for general adult medical examination without abnormal findings: Secondary | ICD-10-CM | POA: Diagnosis not present

## 2020-10-11 DIAGNOSIS — E782 Mixed hyperlipidemia: Secondary | ICD-10-CM | POA: Diagnosis not present

## 2020-10-11 DIAGNOSIS — I10 Essential (primary) hypertension: Secondary | ICD-10-CM

## 2020-10-11 DIAGNOSIS — N401 Enlarged prostate with lower urinary tract symptoms: Secondary | ICD-10-CM | POA: Diagnosis not present

## 2020-10-11 DIAGNOSIS — N183 Chronic kidney disease, stage 3 unspecified: Secondary | ICD-10-CM

## 2020-10-11 DIAGNOSIS — R3912 Poor urinary stream: Secondary | ICD-10-CM | POA: Diagnosis not present

## 2020-10-11 MED ORDER — FINASTERIDE 5 MG PO TABS: 5.0000 mg | ORAL_TABLET | Freq: Every day | ORAL | 3 refills | Status: DC

## 2020-10-11 NOTE — Progress Notes (Signed)
Established Patient Office Visit  Subjective:  Patient ID: [name] [name], male    DOB: 1941-01-07  Age: 79 y.o. MRN: [id number]  CC:  Chief Complaint  Patient presents with  . Annual Exam    Annual exam     HPI [name] [name] is a 79 year old male with PMH of HTN, CAD, BPH, CKD stage 3b, and HLD who presents for annual physical exam.  Patient states that he has been in good health overall. Blood tests were reviewed and discussed with the patient in detail.  Patient had Urgent care visit for pain related to bunion, but has been feeling better now. Takes Tylenol PRN.  Patient takes his medications regularly. His BP was 145/89 today. He denies any headache, dizziness, chest pain, dyspnea or palpitations.  Patient takes Terazosin 2 mg qHS and 1 mg BID. Patient states that he has to urinate multiple times at especially at night, denies dysuria or hematuria.    Past Medical History:  Diagnosis Date  . Anemia, iron deficiency   . Annual physical exam 09/01/2016  . Cancer ([place])    skin cancers   . Educated about COVID-19 virus infection 03/13/2019  . ELECTROCARDIOGRAM, ABNORMAL 08/18/2010   Qualifier: Diagnosis of  By: Moshe Cipro MD, Joycelyn Schmid    . GERD (gastroesophageal reflux disease)   . H/O hiatal hernia   . Headache   . Hemorrhoids    and minimal polp  . History of kidney stones   . History of migraines childhood    Flared up in 2009, and have responded to depakote which was stopped in 9 months  . Hx of colonic polyps 02/24/2007  . Hyperlipidemia 2003  . Hypertension 2003  . Low sodium levels 02/20/2017  . LUNG NODULE 08/18/2010   Qualifier: Diagnosis of  By: Moshe Cipro MD, Margaret  No need for radiologic f/u unless new symptoms develop   . Lung nodules since 2009   CT scan advised in 08/2010  . Neoplasm    dermal  . Nonspecific abnormal findings on radiological and other examination of skull and head 08/18/2010   Qualifier: Diagnosis of  By: Moshe Cipro MD, Arnell Asal  of 08/2010 showed no change over a 2 yr period, no need for further scanning.Pt is a non smoker, will not rept unless symptomatic   . Overweight(278.02)     Past Surgical History:  Procedure Laterality Date  . COLONOSCOPY  2008   Fields-mod IH, 67mm polyp sigmoid colon (disappeared w/ insufflation & could not be retrieved)  . COLONOSCOPY  05/19/2012   Procedure: COLONOSCOPY;  Surgeon: Danie Binder, MD;  Location: [facility];  Service: Endoscopy;  Laterality: N/A;  8:30  . CYSTOSCOPY W/ URETERAL STENT PLACEMENT Left 11/27/2016   Procedure: CYSTOSCOPY WITH RETROGRADE PYELOGRAM/URETERAL STENT PLACEMENT;  Surgeon: Bjorn Loser, MD;  Location: [facility];  Service: Urology;  Laterality: Left;  . ESOPHAGOGASTRODUODENOSCOPY  01/05/2007   Fields-sm HH, benign SB biopsy  . EXTRACORPOREAL SHOCK WAVE LITHOTRIPSY Left 12/08/2016   Procedure: LEFT EXTRACORPOREAL SHOCK WAVE LITHOTRIPSY (ESWL);  Surgeon: Festus Aloe, MD;  Location: [facility];  Service: Urology;  Laterality: Left;  . GIVENS CAPSULE STUDY  01/20/2007   Fields-nothing to explain IDA, lymphectasias  . LEFT HEART CATH AND CORONARY ANGIOGRAPHY N/A 06/15/2020   Procedure: LEFT HEART CATH AND CORONARY ANGIOGRAPHY;  Surgeon: Martinique, Peter M, MD;  Location: [facility];  Service: Cardiovascular;  Laterality: N/A;  . removal of skin cancer twice      Family  History  Problem Relation Age of Onset  . Heart defect Mother        pacemaker   . Alzheimer's disease Mother 60       2011  . Heart defect Brother        bradycardia   . Heart failure Father   . Asthma Father   . Hypertension Daughter   . Kidney Stones Daughter   . Colon cancer Neg Hx     Social History   Socioeconomic History  . Marital status: Married    Spouse name: brenda   . Number of children: 1  . Years of education: 60  . Highest education level: 10th grade  Occupational History  . Occupation: retired, 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  Tobacco Use   . Smoking status: Former Smoker    Packs/day: 2.00    Years: 15.00    Pack years: 30.00    Types: Cigarettes    Quit date: 10/20/1978    Years since quitting: 42.0  . Smokeless tobacco: Former Systems developer    Types: [place] date: 10/20/2004  Vaping Use  . Vaping Use: Never used  Substance and Sexual Activity  . Alcohol use: No  . Drug use: No  . Sexual activity: Yes  Other Topics Concern  . Not on file  Social History Narrative   1 healthy daughter   Lives alone with wife and dog    Social Determinants of Health   Financial Resource Strain: Low Risk   . Difficulty of Paying Living Expenses: Not hard at all  Food Insecurity: No Food Insecurity  . Worried About Charity fundraiser in the Last Year: Never true  . Ran Out of Food in the Last Year: Never true  Transportation Needs: No Transportation Needs  . Lack of Transportation (Medical): No  . Lack of Transportation (Non-Medical): No  Physical Activity: Inactive  . Days of Exercise per Week: 0 days  . Minutes of Exercise per Session: 0 min  Stress: No Stress Concern Present  . Feeling of Stress : Not at all  Social Connections: Moderately Integrated  . Frequency of Communication with Friends and Family: More than three times a week  . Frequency of Social Gatherings with Friends and Family: Twice a week  . Attends Religious Services: Never  . Active Member of Clubs or Organizations: Yes  . Attends Archivist Meetings: More than 4 times per year  . Marital Status: Married  Human resources officer Violence: Not At Risk  . Fear of Current or Ex-Partner: No  . Emotionally Abused: No  . Physically Abused: No  . Sexually Abused: No    Outpatient Medications Prior to Visit  Medication Sig Dispense Refill  . acetaminophen (TYLENOL) 325 MG tablet Take 650 mg by mouth as needed for mild pain.     Marland Kitchen aspirin (ASPIRIN LOW DOSE) 81 MG EC tablet Take 1 tablet (81 mg total) by mouth every morning. 30 tablet 12  . Colchicine 0.6 MG  CAPS Take by mouth.    . hydrochlorothiazide (HYDRODIURIL) 25 MG tablet Take 1 tablet by mouth once daily 90 tablet 0  . Krill Oil 300 MG CAPS Take 300 mg by mouth at bedtime.     Marland Kitchen loratadine (CLARITIN) 10 MG tablet Take 10 mg by mouth daily.    . metoprolol succinate (TOPROL-XL) 50 MG 24 hr tablet TAKE 1 TABLET BY MOUTH ONCE DAILY WITH  OR  IMMEDIATELY  FOLLOWING  A  MEAL (Patient taking differently: Take 50 mg by mouth daily.) 90 tablet 0  . Misc Natural Products (BLACK CHERRY CONCENTRATE PO) Take 1 Dose by mouth daily.     . Multiple Vitamin (MULTIVITAMIN WITH MINERALS) TABS Take 1 tablet by mouth daily.    . nitroGLYCERIN (NITROSTAT) 0.4 MG SL tablet Take 1 tab under your tongue while sitting If no relief of chest pain may repeat one tab every 5 minutes up to 3 tablets total over 15 minutes (Patient taking differently: Place 0.4 mg under the tongue every 5 (five) minutes as needed for chest pain. up to 3 tablets total over 15 minutes) 25 tablet 3  . potassium chloride (KLOR-CON) 10 MEQ tablet Take 1 tablet by mouth once daily 90 tablet 0  . simvastatin (ZOCOR) 40 MG tablet Take 1 tablet (40 mg total) by mouth at bedtime. 90 tablet 1  . terazosin (HYTRIN) 2 MG capsule Take 1 capsule (2 mg total) by mouth at bedtime. 90 capsule 0  . terazosin (HYTRIN) 1 MG capsule Take 1 capsule (1 mg total) by mouth 2 (two) times daily. 180 capsule 0  . predniSONE (DELTASONE) 10 MG tablet Take 40 mg by mouth daily. (Patient not taking: Reported on 10/11/2020)     Facility-Administered Medications Prior to Visit  Medication Dose Route Frequency Provider Last Rate Last Admin  . sodium chloride flush (NS) 0.9 % injection 3 mL  3 mL Intravenous Q12H Branch, Alphonse Guild, MD        Allergies  Allergen Reactions  . Ace Inhibitors Cough    Family member not sure if he is allergic or not.    ROS Review of Systems  Constitutional: Negative for chills and fever.  HENT: Negative for congestion and sore throat.    Eyes: Negative for pain and discharge.  Respiratory: Negative for cough and shortness of breath.   Cardiovascular: Negative for chest pain and palpitations.  Gastrointestinal: Negative for constipation, diarrhea, nausea and vomiting.  Endocrine: Negative for polydipsia and polyuria.  Genitourinary: Positive for difficulty urinating and frequency. Negative for dysuria and hematuria.  Musculoskeletal: Negative for neck pain and neck stiffness.       Foot pain  Skin: Negative for rash.  Neurological: Negative for dizziness, weakness, numbness and headaches.  Psychiatric/Behavioral: Negative for agitation and behavioral problems.      Objective:    Physical Exam Vitals reviewed.  Constitutional:      General: He is not in acute distress.    Appearance: He is not diaphoretic.  HENT:     Head: Normocephalic and atraumatic.     Nose: Nose normal.     Mouth/Throat:     Mouth: Mucous membranes are moist.  Eyes:     General: No scleral icterus.    Extraocular Movements: Extraocular movements intact.     Pupils: Pupils are equal, round, and reactive to light.  Neck:     Vascular: No carotid bruit.  Cardiovascular:     Rate and Rhythm: Normal rate and regular rhythm.     Pulses: Normal pulses.     Heart sounds: Normal heart sounds. No murmur heard.   Pulmonary:     Breath sounds: Normal breath sounds. No wheezing or rales.  Abdominal:     Palpations: Abdomen is soft.     Tenderness: There is no abdominal tenderness.  Musculoskeletal:     Cervical back: Neck supple. No tenderness.     Right lower leg: No edema.  Left lower leg: No edema.  Skin:    General: Skin is warm.     Findings: No rash.  Neurological:     General: No focal deficit present.     Mental Status: He is alert and oriented to person, place, and time.     Sensory: No sensory deficit.     Motor: No weakness.  Psychiatric:        Mood and Affect: Mood normal.        Behavior: Behavior normal.      BP  (!) 145/89 (BP Location: Right Arm, Patient Position: Sitting, Cuff Size: Normal)   Pulse 91   Temp 98 F (36.7 C) (Oral)   Resp 18   Ht 5\' 10"  (1.778 m)   Wt 224 lb 1.9 oz (101.7 kg)   SpO2 91%   BMI 32.16 kg/m  Wt Readings from Last 3 Encounters:  10/11/20 224 lb 1.9 oz (101.7 kg)  09/26/20 222 lb (100.7 kg)  09/03/20 220 lb 1.9 oz (99.8 kg)     Health Maintenance Due  Topic Date Due  . Hepatitis C Screening  Never done  . COVID-19 Vaccine (3 - Booster) 05/31/2020    There are no preventive care reminders to display for this patient.  Lab Results  Component Value Date   TSH 3.07 09/28/2019   Lab Results  Component Value Date   WBC 4.5 10/03/2020   HGB 13.5 10/03/2020   HCT 40.1 10/03/2020   MCV 87 10/03/2020   PLT 181 10/03/2020   Lab Results  Component Value Date   NA 141 10/03/2020   K 4.0 10/03/2020   CO2 24 10/03/2020   GLUCOSE 90 10/03/2020   BUN 17 10/03/2020   CREATININE 1.87 (H) 10/03/2020   BILITOT 0.5 10/03/2020   ALKPHOS 59 10/03/2020   AST 21 10/03/2020   ALT 19 10/03/2020   PROT 6.9 10/03/2020   ALBUMIN 4.4 10/03/2020   CALCIUM 9.3 10/03/2020   ANIONGAP 10 06/13/2020   Lab Results  Component Value Date   CHOL 199 10/03/2020   Lab Results  Component Value Date   HDL 53 10/03/2020   Lab Results  Component Value Date   LDLCALC 118 (H) 10/03/2020   Lab Results  Component Value Date   TRIG 160 (H) 10/03/2020   Lab Results  Component Value Date   CHOLHDL 3.8 10/03/2020   Lab Results  Component Value Date   HGBA1C 5.5 02/16/2020      Assessment & Plan:   Problem List Items Addressed This Visit      Annual physical exam - Primary   Annual exam as documented. Counseling done  re healthy lifestyle involving commitment to 150 minutes exercise per week, heart healthy diet, and attaining healthy weight.The importance of adequate sleep also discussed. Changes in health habits are decided on by the patient with goals and time  frames  set for achieving them. Immunization and cancer screening needs are specifically addressed at this visit.      Cardiovascular and Mediastinum   Essential hypertension    BP Readings from Last 1 Encounters:  10/11/20 (!) 145/89   Mildly elevated, with Metoprolol and HCTZ Would avoid tight control of BP to avoid episodes of hypotension, monitor with current medications Counseled for compliance with the medications If kidney function worsens, will stop diuretic and switch to Hydralazine/Imdur Advised DASH diet and moderate exercise/walking, at least 150 mins/week         Genitourinary   BPH (benign  prostatic hyperplasia)   Relevant Medications   finasteride (PROSCAR) 5 MG tablet   CKD (chronic kidney disease) stage 3, GFR 30-59 ml/min (HCC)    Last CMP reviewed Stable stage 3b CKD Avoid nephrotoxic agents including NSAIDs        Other         Hyperlipidemia    On Simvastatin 40 mg QD         Meds ordered this encounter  Medications  . finasteride (PROSCAR) 5 MG tablet    Sig: Take 1 tablet (5 mg total) by mouth daily.    Dispense:  30 tablet    Refill:  3    Follow-up: Return in about 3 months (around 01/09/2021).    Lindell Spar, MD

## 2020-10-11 NOTE — Patient Instructions (Signed)
Please start taking Finasteride as prescribed. Please take Terazosin 2 mg at bedtime and discontinue 1 mg.  Please continue to take other medications as prescribed.  Please follow low sodium diet for better control of blood pressure.  Please perform moderate exercise/walking as tolerated.

## 2020-10-11 NOTE — Assessment & Plan Note (Signed)
Last CMP reviewed Stable stage 3b CKD Avoid nephrotoxic agents including NSAIDs 

## 2020-10-11 NOTE — Assessment & Plan Note (Signed)
Annual exam as documented. Counseling done  re healthy lifestyle involving commitment to 150 minutes exercise per week, heart healthy diet, and attaining healthy weight.The importance of adequate sleep also discussed. Changes in health habits are decided on by the patient with goals and time frames  set for achieving them. Immunization and cancer screening needs are specifically addressed at this visit. 

## 2020-10-11 NOTE — Assessment & Plan Note (Signed)
BP Readings from Last 1 Encounters:  10/11/20 (!) 145/89   Mildly elevated, with Metoprolol and HCTZ Would avoid tight control of BP to avoid episodes of hypotension, monitor with current medications Counseled for compliance with the medications If kidney function worsens, will stop diuretic and switch to Hydralazine/Imdur Advised DASH diet and moderate exercise/walking, at least 150 mins/week

## 2020-10-11 NOTE — Assessment & Plan Note (Signed)
On Simvastatin 40 mg QD 

## 2020-10-22 ENCOUNTER — Other Ambulatory Visit: Payer: Self-pay | Admitting: Family Medicine

## 2020-10-22 DIAGNOSIS — I10 Essential (primary) hypertension: Secondary | ICD-10-CM

## 2020-11-06 ENCOUNTER — Other Ambulatory Visit: Payer: Self-pay

## 2020-11-06 DIAGNOSIS — N401 Enlarged prostate with lower urinary tract symptoms: Secondary | ICD-10-CM

## 2020-11-06 MED ORDER — FINASTERIDE 5 MG PO TABS: 5.0000 mg | ORAL_TABLET | Freq: Every day | ORAL | 1 refills | Status: DC

## 2020-11-14 DIAGNOSIS — L570 Actinic keratosis: Secondary | ICD-10-CM | POA: Diagnosis not present

## 2020-11-14 DIAGNOSIS — C44722 Squamous cell carcinoma of skin of right lower limb, including hip: Secondary | ICD-10-CM | POA: Diagnosis not present

## 2020-11-29 ENCOUNTER — Other Ambulatory Visit: Payer: Self-pay | Admitting: Family Medicine

## 2020-11-29 DIAGNOSIS — C44722 Squamous cell carcinoma of skin of right lower limb, including hip: Secondary | ICD-10-CM | POA: Diagnosis not present

## 2020-12-04 ENCOUNTER — Other Ambulatory Visit: Payer: Self-pay

## 2020-12-04 ENCOUNTER — Ambulatory Visit: Payer: PPO | Admitting: Podiatry

## 2020-12-04 DIAGNOSIS — M7752 Other enthesopathy of left foot: Secondary | ICD-10-CM | POA: Diagnosis not present

## 2020-12-04 DIAGNOSIS — M205X2 Other deformities of toe(s) (acquired), left foot: Secondary | ICD-10-CM

## 2020-12-04 MED ORDER — BETAMETHASONE SOD PHOS & ACET 6 (3-3) MG/ML IJ SUSP
3.0000 mg | Freq: Once | INTRAMUSCULAR | Status: DC
Start: 2020-12-04 — End: 2021-03-28

## 2020-12-04 NOTE — Progress Notes (Signed)
HPI: 80 y.o. male presenting today for an acute flareup of pain and tenderness to the left great toe joint.  Patient has been diagnosed with a history of hallux limitus and hallux valgus deformity to the bilateral feet.  He was last seen in the office on 09/17/2020 and he was doing very well.  He has received injections in the past at [facility] with significant relief of his symptoms.  He is requesting an injection today.  Patient states that the pain has been present for approximately 2 weeks now.  He denies a history of injury.  Past Medical History:  Diagnosis Date  . Anemia, iron deficiency   . Annual physical exam 09/01/2016  . Cancer ([place])    skin cancers   . Educated about COVID-19 virus infection 03/13/2019  . ELECTROCARDIOGRAM, ABNORMAL 08/18/2010   Qualifier: Diagnosis of  By: Moshe Cipro MD, Joycelyn Schmid    . GERD (gastroesophageal reflux disease)   . H/O hiatal hernia   . Headache   . Hemorrhoids    and minimal polp  . History of kidney stones   . History of migraines childhood    Flared up in 2009, and have responded to depakote which was stopped in 9 months  . Hx of colonic polyps 02/24/2007  . Hyperlipidemia 2003  . Hypertension 2003  . Low sodium levels 02/20/2017  . LUNG NODULE 08/18/2010   Qualifier: Diagnosis of  By: Moshe Cipro MD, Margaret  No need for radiologic f/u unless new symptoms develop   . Lung nodules since 2009   CT scan advised in 08/2010  . Neoplasm    dermal  . Nonspecific abnormal findings on radiological and other examination of skull and head 08/18/2010   Qualifier: Diagnosis of  By: Moshe Cipro MD, Arnell Asal of 08/2010 showed no change over a 2 yr period, no need for further scanning.Pt is a non smoker, will not rept unless symptomatic   . Overweight(278.02)      Physical Exam: General: The patient is alert and oriented x3 in no acute distress.  Dermatology: Skin is warm, dry and supple bilateral lower extremities. Negative for open lesions or  macerations.  Vascular: Palpable pedal pulses bilaterally. edema and erythema noted localized around the first MTPJ of the left foot. Capillary refill within normal limits.  Neurological: Epicritic and protective threshold grossly intact bilaterally.   Musculoskeletal Exam: Range of motion within normal limits to all pedal and ankle joints bilateral. Muscle strength 5/5 in all groups bilateral.  Erythema with edema and pain on palpation or range of motion noted to the first MTPJ of the left foot  Assessment: 1.  First MTPJ capsulitis/possible acute gout left 2.  Hallux limitus left   Plan of Care:  1. Patient evaluated.  2.  Injection of 0.5 cc Celestone Soluspan injection of the first MTPJ left 3.  Continue oral anti-inflammatory as prescribed 4.  Recommend good supportive shoes and sneakers  5.  Return to clinic as needed      Edrick Kins, DPM [facility]  Dr. Edrick Kins, DPM    [address], [place] [address]                Office [phone]  Fax (  [phone]     

## 2020-12-05 ENCOUNTER — Other Ambulatory Visit: Payer: Self-pay | Admitting: Family Medicine

## 2020-12-05 DIAGNOSIS — I10 Essential (primary) hypertension: Secondary | ICD-10-CM

## 2020-12-17 ENCOUNTER — Ambulatory Visit: Payer: PPO | Admitting: Podiatry

## 2020-12-19 DIAGNOSIS — C44629 Squamous cell carcinoma of skin of left upper limb, including shoulder: Secondary | ICD-10-CM | POA: Diagnosis not present

## 2021-01-04 ENCOUNTER — Other Ambulatory Visit: Payer: Self-pay | Admitting: Family Medicine

## 2021-01-09 ENCOUNTER — Ambulatory Visit: Payer: PPO | Admitting: Family Medicine

## 2021-01-10 DIAGNOSIS — C44629 Squamous cell carcinoma of skin of left upper limb, including shoulder: Secondary | ICD-10-CM | POA: Diagnosis not present

## 2021-01-21 ENCOUNTER — Encounter: Payer: Self-pay | Admitting: Family Medicine

## 2021-01-21 ENCOUNTER — Other Ambulatory Visit: Payer: Self-pay

## 2021-01-21 ENCOUNTER — Ambulatory Visit (INDEPENDENT_AMBULATORY_CARE_PROVIDER_SITE_OTHER): Payer: PPO | Admitting: Family Medicine

## 2021-01-21 VITALS — BP 154/88 | HR 87 | Temp 97.4°F | Ht 70.0 in | Wt 231.0 lb

## 2021-01-21 DIAGNOSIS — I10 Essential (primary) hypertension: Secondary | ICD-10-CM | POA: Diagnosis not present

## 2021-01-21 DIAGNOSIS — M25511 Pain in right shoulder: Secondary | ICD-10-CM | POA: Diagnosis not present

## 2021-01-21 DIAGNOSIS — E782 Mixed hyperlipidemia: Secondary | ICD-10-CM

## 2021-01-21 DIAGNOSIS — E669 Obesity, unspecified: Secondary | ICD-10-CM

## 2021-01-21 DIAGNOSIS — Z1159 Encounter for screening for other viral diseases: Secondary | ICD-10-CM

## 2021-01-21 MED ORDER — AZELASTINE HCL 0.1 % NA SOLN: 2.0000 | Freq: Two times a day (BID) | NASAL | 12 refills | Status: DC

## 2021-01-21 NOTE — Patient Instructions (Addendum)
F/U in 5 months, call if you need me before, flu vaccine at visit  Please get fasting lipid, cmp and eGFr, tSH, hepatitis C screen  New additional med for allergies  astelin nose spray  It is important that you exercise regularly at least 30 minutes 5 times a week. If you develop chest pain, have severe difficulty breathing, or feel very tired, stop exercising immediately and seek medical attention   Stop  cut back on sweet tea, sugar , weight loss goal of 2 pounds /month  It is important that you exercise regularly at least 30 minutes 5 times a week. If you develop chest pain, have severe difficulty breathing, or feel very tired, stop exercising immediately and seek medical attention    Think about what you will eat, plan ahead. Choose " clean, green, fresh or frozen" over canned, processed or packaged foods which are more sugary, salty and fatty. 70 to 75% of food eaten should be vegetables and fruit. Three meals at set times with snacks allowed between meals, but they must be fruit or vegetables. Aim to eat over a 12 hour period , example 7 am to 7 pm, and STOP after  your last meal of the day. Drink water,generally about 64 ounces per day, no other drink is as healthy. Fruit juice is best enjoyed in a healthy way, by EATING the fruit.   Thanks for choosing [facility], we consider it a privelige to serve you.

## 2021-01-22 ENCOUNTER — Other Ambulatory Visit: Payer: Self-pay | Admitting: Family Medicine

## 2021-01-22 DIAGNOSIS — I10 Essential (primary) hypertension: Secondary | ICD-10-CM

## 2021-02-01 ENCOUNTER — Encounter: Payer: Self-pay | Admitting: Family Medicine

## 2021-02-01 NOTE — Progress Notes (Signed)
   [name] [name]     MRN: [id number]      DOB: Jun 26, 1941   HPI [name] [name] is here for follow up and re-evaluation of chronic medical conditions, medication management and review of any available recent lab and radiology data.  Preventive health is updated, specifically  Cancer screening and Immunization.   Questions or concerns regarding consultations or procedures which the PT has had in the interim are  addressed. The PT denies any adverse reactions to current medications since the last visit.  C/o weight gain and shoulder pain   ROS Denies recent fever or chills. Denies sinus pressure, nasal congestion, ear pain or sore throat. Denies chest congestion, productive cough or wheezing. Denies chest pains, palpitations and leg swelling Denies abdominal pain, nausea, vomiting,diarrhea or constipation.   Denies dysuria, frequency, hesitancy or incontinence. Denies headaches, seizures, numbness, or tingling. Denies depression, anxiety or insomnia. Denies skin break down or rash.   PE  BP (!) 154/88 (BP Location: Right Arm, Patient Position: Sitting, Cuff Size: Normal)   Pulse 87   Temp (!) 97.4 F (36.3 C) (Temporal)   Ht 5\' 10"  (1.778 m)   Wt 231 lb (104.8 kg)   SpO2 95%   BMI 33.15 kg/m   Patient alert and oriented and in no cardiopulmonary distress.  HEENT: No facial asymmetry, EOMI,     Neck supple .  Chest: Clear to auscultation bilaterally.  CVS: S1, S2 no murmurs, no S3.Regular rate.  ABD: Soft non tender.   Ext: No edema  MS: Adequate ROM spine, hips and knees. Reduced ROM right shoulder Skin: Intact, no ulcerations or rash noted.  Psych: Good eye contact, normal affect. Memory intact not anxious or depressed appearing.  CNS: CN 2-12 intact, power,  normal throughout.no focal deficits noted.   Assessment & Plan  Essential hypertension Uncontrolled, significant weight gain, work on lifestyle modification and review DASH diet and commitment to daily  physical activity for a minimum of 30 minutes discussed and encouraged, as a part of hypertension management. The importance of attaining a healthy weight is also discussed.  BP/Weight 01/21/2021 10/11/2020 09/26/2020 09/03/2020 06/28/2020 06/15/2020 0/35/0093  Systolic BP 818 299 371 696 789 381 -  Diastolic BP 88 89 93 93 78 66 -  Wt. (Lbs) 231 224.12 222 220.12 221.8 223 222  BMI 33.15 32.16 31.85 31.58 31.82 32 31.85       Hyperlipidemia Hyperlipidemia:Low fat diet discussed and encouraged.   Lipid Panel  Lab Results  Component Value Date   CHOL 199 10/03/2020   HDL 53 10/03/2020   LDLCALC 118 (H) 10/03/2020   TRIG 160 (H) 10/03/2020   CHOLHDL 3.8 10/03/2020     Need to educe fat intake  Obesity (BMI 30.0-34.9)  Patient re-educated about  the importance of commitment to a  minimum of 150 minutes of exercise per week as able.  The importance of healthy food choices with portion control discussed, as well as eating regularly and within a 12 hour window most days. The need to choose "clean , green" food 50 to 75% of the time is discussed, as well as to make water the primary drink and set a goal of 64 ounces water daily.    Weight /BMI 01/21/2021 10/11/2020 09/26/2020  WEIGHT 231 lb 224 lb 1.9 oz 222 lb  HEIGHT 5\' 10"  5\' 10"  5\' 10"   BMI 33.15 kg/m2 32.16 kg/m2 31.85 kg/m2

## 2021-02-01 NOTE — Assessment & Plan Note (Signed)
Uncontrolled, significant weight gain, work on lifestyle modification and review DASH diet and commitment to daily physical activity for a minimum of 30 minutes discussed and encouraged, as a part of hypertension management. The importance of attaining a healthy weight is also discussed.  BP/Weight 01/21/2021 10/11/2020 09/26/2020 09/03/2020 06/28/2020 06/15/2020 2/81/1886  Systolic BP 773 736 681 594 707 615 -  Diastolic BP 88 89 93 93 78 66 -  Wt. (Lbs) 231 224.12 222 220.12 221.8 223 222  BMI 33.15 32.16 31.85 31.58 31.82 32 31.85

## 2021-02-01 NOTE — Assessment & Plan Note (Signed)
Hyperlipidemia:Low fat diet discussed and encouraged.   Lipid Panel  Lab Results  Component Value Date   CHOL 199 10/03/2020   HDL 53 10/03/2020   LDLCALC 118 (H) 10/03/2020   TRIG 160 (H) 10/03/2020   CHOLHDL 3.8 10/03/2020     Need to educe fat intake

## 2021-02-01 NOTE — Assessment & Plan Note (Signed)
  Patient re-educated about  the importance of commitment to a  minimum of 150 minutes of exercise per week as able.  The importance of healthy food choices with portion control discussed, as well as eating regularly and within a 12 hour window most days. The need to choose "clean , green" food 50 to 75% of the time is discussed, as well as to make water the primary drink and set a goal of 64 ounces water daily.    Weight /BMI 01/21/2021 10/11/2020 09/26/2020  WEIGHT 231 lb 224 lb 1.9 oz 222 lb  HEIGHT 5\' 10"  5\' 10"  5\' 10"   BMI 33.15 kg/m2 32.16 kg/m2 31.85 kg/m2

## 2021-03-04 ENCOUNTER — Other Ambulatory Visit: Payer: Self-pay | Admitting: Family Medicine

## 2021-03-04 DIAGNOSIS — M9905 Segmental and somatic dysfunction of pelvic region: Secondary | ICD-10-CM | POA: Diagnosis not present

## 2021-03-04 DIAGNOSIS — M9903 Segmental and somatic dysfunction of lumbar region: Secondary | ICD-10-CM | POA: Diagnosis not present

## 2021-03-04 DIAGNOSIS — I10 Essential (primary) hypertension: Secondary | ICD-10-CM

## 2021-03-04 DIAGNOSIS — M9902 Segmental and somatic dysfunction of thoracic region: Secondary | ICD-10-CM | POA: Diagnosis not present

## 2021-03-04 DIAGNOSIS — M62830 Muscle spasm of back: Secondary | ICD-10-CM | POA: Diagnosis not present

## 2021-03-08 DIAGNOSIS — M62830 Muscle spasm of back: Secondary | ICD-10-CM | POA: Diagnosis not present

## 2021-03-08 DIAGNOSIS — M9902 Segmental and somatic dysfunction of thoracic region: Secondary | ICD-10-CM | POA: Diagnosis not present

## 2021-03-08 DIAGNOSIS — M9905 Segmental and somatic dysfunction of pelvic region: Secondary | ICD-10-CM | POA: Diagnosis not present

## 2021-03-08 DIAGNOSIS — M9903 Segmental and somatic dysfunction of lumbar region: Secondary | ICD-10-CM | POA: Diagnosis not present

## 2021-03-11 ENCOUNTER — Other Ambulatory Visit: Payer: Self-pay | Admitting: Family Medicine

## 2021-03-13 DIAGNOSIS — M9903 Segmental and somatic dysfunction of lumbar region: Secondary | ICD-10-CM | POA: Diagnosis not present

## 2021-03-13 DIAGNOSIS — M62830 Muscle spasm of back: Secondary | ICD-10-CM | POA: Diagnosis not present

## 2021-03-13 DIAGNOSIS — M9905 Segmental and somatic dysfunction of pelvic region: Secondary | ICD-10-CM | POA: Diagnosis not present

## 2021-03-13 DIAGNOSIS — M9902 Segmental and somatic dysfunction of thoracic region: Secondary | ICD-10-CM | POA: Diagnosis not present

## 2021-03-15 DIAGNOSIS — M62830 Muscle spasm of back: Secondary | ICD-10-CM | POA: Diagnosis not present

## 2021-03-15 DIAGNOSIS — M9902 Segmental and somatic dysfunction of thoracic region: Secondary | ICD-10-CM | POA: Diagnosis not present

## 2021-03-15 DIAGNOSIS — M9905 Segmental and somatic dysfunction of pelvic region: Secondary | ICD-10-CM | POA: Diagnosis not present

## 2021-03-15 DIAGNOSIS — M9903 Segmental and somatic dysfunction of lumbar region: Secondary | ICD-10-CM | POA: Diagnosis not present

## 2021-03-19 DIAGNOSIS — M62830 Muscle spasm of back: Secondary | ICD-10-CM | POA: Diagnosis not present

## 2021-03-19 DIAGNOSIS — M9903 Segmental and somatic dysfunction of lumbar region: Secondary | ICD-10-CM | POA: Diagnosis not present

## 2021-03-19 DIAGNOSIS — M9905 Segmental and somatic dysfunction of pelvic region: Secondary | ICD-10-CM | POA: Diagnosis not present

## 2021-03-19 DIAGNOSIS — M9902 Segmental and somatic dysfunction of thoracic region: Secondary | ICD-10-CM | POA: Diagnosis not present

## 2021-03-22 DIAGNOSIS — M9903 Segmental and somatic dysfunction of lumbar region: Secondary | ICD-10-CM | POA: Diagnosis not present

## 2021-03-22 DIAGNOSIS — M9905 Segmental and somatic dysfunction of pelvic region: Secondary | ICD-10-CM | POA: Diagnosis not present

## 2021-03-22 DIAGNOSIS — M62830 Muscle spasm of back: Secondary | ICD-10-CM | POA: Diagnosis not present

## 2021-03-22 DIAGNOSIS — M9902 Segmental and somatic dysfunction of thoracic region: Secondary | ICD-10-CM | POA: Diagnosis not present

## 2021-03-27 ENCOUNTER — Ambulatory Visit: Payer: PPO | Admitting: Family Medicine

## 2021-03-27 DIAGNOSIS — M9905 Segmental and somatic dysfunction of pelvic region: Secondary | ICD-10-CM | POA: Diagnosis not present

## 2021-03-27 DIAGNOSIS — M9902 Segmental and somatic dysfunction of thoracic region: Secondary | ICD-10-CM | POA: Diagnosis not present

## 2021-03-27 DIAGNOSIS — M62830 Muscle spasm of back: Secondary | ICD-10-CM | POA: Diagnosis not present

## 2021-03-27 DIAGNOSIS — M9903 Segmental and somatic dysfunction of lumbar region: Secondary | ICD-10-CM | POA: Diagnosis not present

## 2021-03-28 ENCOUNTER — Ambulatory Visit (INDEPENDENT_AMBULATORY_CARE_PROVIDER_SITE_OTHER): Payer: PPO | Admitting: Family Medicine

## 2021-03-28 ENCOUNTER — Ambulatory Visit (HOSPITAL_COMMUNITY)
Admission: RE | Admit: 2021-03-28 | Discharge: 2021-03-28 | Disposition: A | Discharge status: Home / Self Care | Payer: PPO | Source: Ambulatory Visit | Attending: Family Medicine | Admitting: Family Medicine

## 2021-03-28 ENCOUNTER — Other Ambulatory Visit: Payer: Self-pay

## 2021-03-28 ENCOUNTER — Encounter: Payer: Self-pay | Admitting: Family Medicine

## 2021-03-28 ENCOUNTER — Ambulatory Visit: Payer: PPO | Admitting: Family Medicine

## 2021-03-28 VITALS — BP 147/75 | HR 89 | Temp 98.6°F | Resp 18 | Ht 70.0 in | Wt 220.1 lb

## 2021-03-28 DIAGNOSIS — I10 Essential (primary) hypertension: Secondary | ICD-10-CM | POA: Diagnosis not present

## 2021-03-28 DIAGNOSIS — G8929 Other chronic pain: Secondary | ICD-10-CM | POA: Diagnosis not present

## 2021-03-28 DIAGNOSIS — M5442 Lumbago with sciatica, left side: Secondary | ICD-10-CM | POA: Diagnosis not present

## 2021-03-28 DIAGNOSIS — M546 Pain in thoracic spine: Secondary | ICD-10-CM

## 2021-03-28 DIAGNOSIS — M545 Low back pain, unspecified: Secondary | ICD-10-CM

## 2021-03-28 DIAGNOSIS — M2578 Osteophyte, vertebrae: Secondary | ICD-10-CM | POA: Diagnosis not present

## 2021-03-28 DIAGNOSIS — M419 Scoliosis, unspecified: Secondary | ICD-10-CM | POA: Diagnosis not present

## 2021-03-28 DIAGNOSIS — M5134 Other intervertebral disc degeneration, thoracic region: Secondary | ICD-10-CM | POA: Diagnosis not present

## 2021-03-28 DIAGNOSIS — E782 Mixed hyperlipidemia: Secondary | ICD-10-CM

## 2021-03-28 MED ORDER — METHYLPREDNISOLONE ACETATE 80 MG/ML IJ SUSP
80.0000 mg | Freq: Once | INTRAMUSCULAR | Status: AC
  Administered 2021-03-28: 80 mg via INTRAMUSCULAR

## 2021-03-28 MED ORDER — MELOXICAM 15 MG PO TABS: 15.0000 mg | ORAL_TABLET | Freq: Every day | ORAL | 0 refills | Status: DC

## 2021-03-28 MED ORDER — FAMOTIDINE 40 MG PO TABS: 40.0000 mg | ORAL_TABLET | Freq: Every day | ORAL | 0 refills | Status: DC

## 2021-03-28 MED ORDER — PREDNISONE 10 MG PO TABS: 10.0000 mg | ORAL_TABLET | Freq: Two times a day (BID) | ORAL | 0 refills | Status: DC

## 2021-03-28 MED ORDER — TIZANIDINE HCL 2 MG PO TABS: ORAL_TABLET | ORAL | 0 refills | Status: DC

## 2021-03-28 MED ORDER — KETOROLAC TROMETHAMINE 60 MG/2ML IM SOLN
60.0000 mg | Freq: Once | INTRAMUSCULAR | Status: AC
  Administered 2021-03-28: 60 mg via INTRAMUSCULAR

## 2021-03-28 NOTE — Patient Instructions (Signed)
F/U as before, call if you need me before  Toradol 60 mg  and Depo Medrol 80 mg IM in the office today  Meloxicam, prednisone, pepcid and zanaflex are prescribed, take as described  PLEASE change behavior that aggravates  your back  Thanks for choosing [facility], we consider it a privelige to serve you.

## 2021-03-29 DIAGNOSIS — M9902 Segmental and somatic dysfunction of thoracic region: Secondary | ICD-10-CM | POA: Diagnosis not present

## 2021-03-29 DIAGNOSIS — M62830 Muscle spasm of back: Secondary | ICD-10-CM | POA: Diagnosis not present

## 2021-03-29 DIAGNOSIS — M9903 Segmental and somatic dysfunction of lumbar region: Secondary | ICD-10-CM | POA: Diagnosis not present

## 2021-03-29 DIAGNOSIS — M549 Dorsalgia, unspecified: Secondary | ICD-10-CM | POA: Insufficient documentation

## 2021-03-29 DIAGNOSIS — M9905 Segmental and somatic dysfunction of pelvic region: Secondary | ICD-10-CM | POA: Diagnosis not present

## 2021-03-29 NOTE — Assessment & Plan Note (Signed)
Hyperlipidemia:Low fat diet discussed and encouraged.   Lipid Panel  Lab Results  Component Value Date   CHOL 199 10/03/2020   HDL 53 10/03/2020   LDLCALC 118 (H) 10/03/2020   TRIG 160 (H) 10/03/2020   CHOLHDL 3.8 10/03/2020  needs to reduce fried and fatty food

## 2021-03-29 NOTE — Progress Notes (Signed)
   [name] [name]     MRN: [id number]      DOB: 07/11/1941   HPI [name] is here with a 3 weeks h/o increased and uncontrolled mid and low back pain, aggravated by lifting heavy loads Pain radiates from mid back to buttock, chiropractor recommended he see PCP for medication No new weakness, numbness or incontinence  ROS Denies recent fever or chills. Denies sinus pressure, nasal congestion, ear pain or sore throat. Denies chest congestion, productive cough or wheezing. Denies chest pains, palpitations and leg swelling Denies abdominal pain, nausea, vomiting,diarrhea or constipation.   Denies dysuria, frequency, hesitancy or incontinence. . Denies headaches, seizures, numbness, or tingling. Denies depression, anxiety or insomnia. Denies skin break down or rash.   PE  BP (!) 147/75 (BP Location: Right Arm, Patient Position: Sitting, Cuff Size: Normal)   Pulse 89   Temp 98.6 F (37 C) (Oral)   Resp 18   Ht 5\' 10"  (1.778 m)   Wt 220 lb 1.3 oz (99.8 kg)   SpO2 96%   BMI 31.58 kg/m   Patient alert and oriented and in no cardiopulmonary distress.  HEENT: No facial asymmetry, EOMI,     Neck supple .  Chest: Clear to auscultation bilaterally.  CVS: S1, S2 no murmurs, no S3.Regular rate.  ABD: Soft non tender.   Ext: No edema Decreased e ROM spine, shoulders hips and knees.  Skin: Intact, no ulcerations or rash noted.  Psych: Good eye contact, normal affect. Memory intact not anxious or depressed appearing.  CNS: CN 2-12 intact, power,  normal throughout.no focal deficits noted.   Assessment & Plan  Back pain Uncontrolled.Toradol and depo medrol administered IM in the office , to be followed by a short course of oral prednisone and NSAIDS. Prednisone and Zanaflex and meloxicam prescribed and pepcid  Essential hypertension Un Controlled, no change in medication DASH diet and commitment to daily physical activity for a minimum of 30 minutes discussed and  encouraged, as a part of hypertension management. The importance of attaining a healthy weight is also discussed.  BP/Weight 03/28/2021 01/21/2021 10/11/2020 09/26/2020 09/03/2020 06/28/2020 6/44/0347  Systolic BP 425 956 387 [phone]  Diastolic BP 75 88 89 93 93 78 66  Wt. (Lbs) 220.08 231 224.12 222 220.12 221.8 223  BMI 31.58 33.15 32.16 31.85 31.58 31.82 32       Hyperlipidemia Hyperlipidemia:Low fat diet discussed and encouraged.   Lipid Panel  Lab Results  Component Value Date   CHOL 199 10/03/2020   HDL 53 10/03/2020   LDLCALC 118 (H) 10/03/2020   TRIG 160 (H) 10/03/2020   CHOLHDL 3.8 10/03/2020  needs to reduce fried and fatty food   '

## 2021-03-29 NOTE — Assessment & Plan Note (Signed)
Uncontrolled.Toradol and depo medrol administered IM in the office , to be followed by a short course of oral prednisone and NSAIDS. Prednisone and Zanaflex and meloxicam prescribed and pepcid

## 2021-03-29 NOTE — Assessment & Plan Note (Signed)
Un Controlled, no change in medication DASH diet and commitment to daily physical activity for a minimum of 30 minutes discussed and encouraged, as a part of hypertension management. The importance of attaining a healthy weight is also discussed.  BP/Weight 03/28/2021 01/21/2021 10/11/2020 09/26/2020 09/03/2020 06/28/2020 9/80/6999  Systolic BP 672 277 375 051 071 252 479  Diastolic BP 75 88 89 93 93 78 66  Wt. (Lbs) 220.08 231 224.12 222 220.12 221.8 223  BMI 31.58 33.15 32.16 31.85 31.58 31.82 32

## 2021-04-02 ENCOUNTER — Other Ambulatory Visit: Payer: Self-pay | Admitting: Family Medicine

## 2021-04-05 DIAGNOSIS — M9903 Segmental and somatic dysfunction of lumbar region: Secondary | ICD-10-CM | POA: Diagnosis not present

## 2021-04-05 DIAGNOSIS — M62830 Muscle spasm of back: Secondary | ICD-10-CM | POA: Diagnosis not present

## 2021-04-05 DIAGNOSIS — M9902 Segmental and somatic dysfunction of thoracic region: Secondary | ICD-10-CM | POA: Diagnosis not present

## 2021-04-05 DIAGNOSIS — M9905 Segmental and somatic dysfunction of pelvic region: Secondary | ICD-10-CM | POA: Diagnosis not present

## 2021-04-08 ENCOUNTER — Telehealth: Payer: Self-pay

## 2021-04-08 NOTE — Telephone Encounter (Signed)
[name] [name] informed.

## 2021-04-08 NOTE — Telephone Encounter (Signed)
[name] [name] is returning call for imaging results

## 2021-05-01 ENCOUNTER — Other Ambulatory Visit: Payer: Self-pay | Admitting: Family Medicine

## 2021-05-01 DIAGNOSIS — I10 Essential (primary) hypertension: Secondary | ICD-10-CM

## 2021-05-10 ENCOUNTER — Other Ambulatory Visit: Payer: Self-pay | Admitting: Internal Medicine

## 2021-05-10 DIAGNOSIS — N401 Enlarged prostate with lower urinary tract symptoms: Secondary | ICD-10-CM

## 2021-05-30 ENCOUNTER — Other Ambulatory Visit: Payer: Self-pay | Admitting: Family Medicine

## 2021-05-30 DIAGNOSIS — I10 Essential (primary) hypertension: Secondary | ICD-10-CM

## 2021-06-06 ENCOUNTER — Other Ambulatory Visit: Payer: Self-pay | Admitting: Family Medicine

## 2021-06-17 DIAGNOSIS — M65271 Calcific tendinitis, right ankle and foot: Secondary | ICD-10-CM | POA: Diagnosis not present

## 2021-06-17 DIAGNOSIS — L570 Actinic keratosis: Secondary | ICD-10-CM | POA: Diagnosis not present

## 2021-06-17 DIAGNOSIS — M79671 Pain in right foot: Secondary | ICD-10-CM | POA: Diagnosis not present

## 2021-06-21 DIAGNOSIS — I10 Essential (primary) hypertension: Secondary | ICD-10-CM | POA: Diagnosis not present

## 2021-06-21 DIAGNOSIS — Z1159 Encounter for screening for other viral diseases: Secondary | ICD-10-CM | POA: Diagnosis not present

## 2021-06-21 DIAGNOSIS — E782 Mixed hyperlipidemia: Secondary | ICD-10-CM | POA: Diagnosis not present

## 2021-06-21 DIAGNOSIS — Z7689 Persons encountering health services in other specified circumstances: Secondary | ICD-10-CM | POA: Diagnosis not present

## 2021-06-22 LAB — CMP14+EGFR
ALT: 16 IU/L (ref 0–44)
AST: 20 IU/L (ref 0–40)
Albumin/Globulin Ratio: 2.5 — ABNORMAL HIGH (ref 1.2–2.2)
Albumin: 4.5 g/dL (ref 3.7–4.7)
Alkaline Phosphatase: 56 IU/L (ref 44–121)
BUN/Creatinine Ratio: 11 (ref 10–24)
BUN: 19 mg/dL (ref 8–27)
Bilirubin Total: 0.3 mg/dL (ref 0.0–1.2)
CO2: 24 mmol/L (ref 20–29)
Calcium: 9.3 mg/dL (ref 8.6–10.2)
Chloride: 103 mmol/L (ref 96–106)
Creatinine, Ser: 1.69 mg/dL — ABNORMAL HIGH (ref 0.76–1.27)
Globulin, Total: 1.8 g/dL (ref 1.5–4.5)
Glucose: 82 mg/dL (ref 65–99)
Potassium: 3.8 mmol/L (ref 3.5–5.2)
Sodium: 143 mmol/L (ref 134–144)
Total Protein: 6.3 g/dL (ref 6.0–8.5)
eGFR: 41 mL/min/{1.73_m2} — ABNORMAL LOW (ref >59)

## 2021-06-22 LAB — LIPID PANEL
Chol/HDL Ratio: 3.7 ratio (ref 0.0–5.0)
Cholesterol, Total: 195 mg/dL (ref 100–199)
HDL: 53 mg/dL (ref >39)
LDL Chol Calc (NIH): 112 mg/dL — ABNORMAL HIGH (ref 0–99)
Triglycerides: 171 mg/dL — ABNORMAL HIGH (ref 0–149)
VLDL Cholesterol Cal: 30 mg/dL (ref 5–40)

## 2021-06-22 LAB — HEPATITIS C ANTIBODY: Hep C Virus Ab: <0.1 s/co ratio (ref 0.0–0.9)

## 2021-06-22 LAB — TSH: TSH: 5.92 u[IU]/mL — ABNORMAL HIGH (ref 0.450–4.500)

## 2021-06-25 ENCOUNTER — Ambulatory Visit (INDEPENDENT_AMBULATORY_CARE_PROVIDER_SITE_OTHER): Payer: PPO | Admitting: Family Medicine

## 2021-06-25 ENCOUNTER — Encounter: Payer: Self-pay | Admitting: Family Medicine

## 2021-06-25 ENCOUNTER — Other Ambulatory Visit: Payer: Self-pay

## 2021-06-25 VITALS — BP 148/92 | HR 77 | Temp 98.2°F | Resp 18 | Ht 70.0 in | Wt 220.0 lb

## 2021-06-25 DIAGNOSIS — E782 Mixed hyperlipidemia: Secondary | ICD-10-CM

## 2021-06-25 DIAGNOSIS — Z125 Encounter for screening for malignant neoplasm of prostate: Secondary | ICD-10-CM | POA: Diagnosis not present

## 2021-06-25 DIAGNOSIS — Z1211 Encounter for screening for malignant neoplasm of colon: Secondary | ICD-10-CM | POA: Diagnosis not present

## 2021-06-25 DIAGNOSIS — I10 Essential (primary) hypertension: Secondary | ICD-10-CM | POA: Diagnosis not present

## 2021-06-25 DIAGNOSIS — N183 Chronic kidney disease, stage 3 unspecified: Secondary | ICD-10-CM | POA: Diagnosis not present

## 2021-06-25 DIAGNOSIS — E669 Obesity, unspecified: Secondary | ICD-10-CM

## 2021-06-25 DIAGNOSIS — Z23 Encounter for immunization: Secondary | ICD-10-CM

## 2021-06-25 DIAGNOSIS — R7989 Other specified abnormal findings of blood chemistry: Secondary | ICD-10-CM

## 2021-06-25 MED ORDER — METOPROLOL SUCCINATE ER 25 MG PO TB24: ORAL_TABLET | ORAL | 3 refills | Status: DC

## 2021-06-25 MED ORDER — ROSUVASTATIN CALCIUM 20 MG PO TABS: 20.0000 mg | ORAL_TABLET | Freq: Every day | ORAL | 3 refills | Status: DC

## 2021-06-25 NOTE — Assessment & Plan Note (Signed)
Stable, needs better hTN control

## 2021-06-25 NOTE — Assessment & Plan Note (Signed)
  Patient re-educated about  the importance of commitment to a  minimum of 150 minutes of exercise per week as able.  The importance of healthy food choices with portion control discussed, as well as eating regularly and within a 12 hour window most days. The need to choose "clean , green" food 50 to 75% of the time is discussed, as well as to make water the primary drink and set a goal of 64 ounces water daily.    Weight /BMI 06/25/2021 03/28/2021 01/21/2021  WEIGHT 220 lb 220 lb 1.3 oz 231 lb  HEIGHT '5\' 10"'$  '5\' 10"'$  '5\' 10"'$   BMI 31.57 kg/m2 31.58 kg/m2 33.15 kg/m2

## 2021-06-25 NOTE — Assessment & Plan Note (Signed)
No thyromegaly or nodule on exam, add Free T3 and free T4

## 2021-06-25 NOTE — Assessment & Plan Note (Signed)
Uncontrolled, inc metoprolol dose, change diet , and re eval in 4 monthts DASH diet and commitment to daily physical activity for a minimum of 30 minutes discussed and encouraged, as a part of hypertension management. The importance of attaining a healthy weight is also discussed.  BP/Weight 06/25/2021 03/28/2021 01/21/2021 10/11/2020 09/26/2020 [id number]  Systolic BP 123456 Q000111Q 123456 Q000111Q 123456 [id number]  Diastolic BP 92 75 88 89 93 93 78  Wt. (Lbs) 220 220.08 231 224.12 222 220.12 221.8  BMI 31.57 31.58 33.15 32.16 31.85 31.58 31.82

## 2021-06-25 NOTE — Patient Instructions (Addendum)
Annual exam 12/24 or after, re eval blood pressure and cholesterol, call if you need me sooner  PLEASE BRING ALL medications YOU ARE TAKING TO all OF YOUR APPOINTMENTS  Flu vaccine today.  Please get your covid booster as soon as possible  Please get the 2 shingrix vaccines at your pharmacy  NEW medication for cholesterol, stop simvastatin, new is rosuvastatin 20 mg daily, also please change food choice to " healthier" less salt, less fat  NEW higher dose metoprolol is 75 mg daily, take 50 mg and and additional 25 mg tablet together every day  fASTING LIPID, CMP AND egfr,    AND cbc AND psa 5 DAYS BEFORE NEXT VISIT  PLEASE ARRANGE COLOGUARD TEST AT THIS VISIT  Thanks for choosing [facility], we consider it a privelige to serve you.

## 2021-06-25 NOTE — Assessment & Plan Note (Signed)
Not at goal, change to crestor and change food choice Hyperlipidemia:Low fat diet discussed and encouraged.   Lipid Panel  Lab Results  Component Value Date   CHOL 195 06/21/2021   HDL 53 06/21/2021   LDLCALC 112 (H) 06/21/2021   TRIG 171 (H) 06/21/2021   CHOLHDL 3.7 06/21/2021

## 2021-06-25 NOTE — Progress Notes (Signed)
STRIDER ALI     MRN: [id number]      DOB: [birth year]   HPI Mr. Simerson is here for follow up and re-evaluation of chronic medical conditions, medication management and review of any available recent lab and radiology data.  Preventive health is updated, specifically  Cancer screening and Immunization.   Questions or concerns regarding consultations or procedures which the PT has had in the interim are  addressed.Currently in a boot on his right foot due to Achilles tendonitis, started wearing this last week The PT denies any adverse reactions to current medications since the last visit.  Does not meds at this review, clearly understands the need to bring them to next visit, blood pressure and cholesterol are not controlled   ROS Denies recent fever or chills. Denies sinus pressure, nasal congestion, ear pain or sore throat. Denies chest congestion, productive cough or wheezing. Denies chest pains, palpitations and leg swelling Denies abdominal pain, nausea, vomiting,diarrhea or constipation.   Fair urinary stream and frequency. Denies uncontrolled joint pain, swelling and limitation in mobility. Denies headaches, seizures, numbness, or tingling. Denies depression, anxiety or insomnia. Denies skin break down or rash.   PE  BP (!) 148/92   Pulse 77   Temp 98.2 F (36.8 C)   Resp 18   Ht '5\' 10"'$  (1.778 m)   Wt 220 lb (99.8 kg)   SpO2 93%   BMI 31.57 kg/m   Patient alert and oriented and in no cardiopulmonary distress.  HEENT: No facial asymmetry, EOMI,     Neck supple .  Chest: Clear to auscultation bilaterally.  CVS: S1, S2 no murmurs, no S3.Regular rate.  ABD: Soft non tender.   Ext: No edema  MS: Adequate ROM spine, shoulders, hips and knees.  Skin: Intact, no ulcerations or rash noted.  Psych: Good eye contact, normal affect. Memory intact not anxious or depressed appearing.  CNS: CN 2-12 intact, power,  normal throughout.no focal deficits  noted.   Assessment & Plan  Essential hypertension Uncontrolled, inc metoprolol dose, change diet , and re eval in 4 monthts DASH diet and commitment to daily physical activity for a minimum of 30 minutes discussed and encouraged, as a part of hypertension management. The importance of attaining a healthy weight is also discussed.  BP/Weight 06/25/2021 03/28/2021 01/21/2021 10/11/2020 09/26/2020 [id number]  Systolic BP 123456 Q000111Q 123456 Q000111Q 123456 123456 [id number]  Diastolic BP 92 75 88 89 93 93 78  Wt. (Lbs) 220 220.08 231 224.12 222 220.12 221.8  BMI 31.57 31.58 33.15 32.16 31.85 31.58 31.82       Hyperlipidemia Not at goal, change to crestor and change food choice Hyperlipidemia:Low fat diet discussed and encouraged.   Lipid Panel  Lab Results  Component Value Date   CHOL 195 06/21/2021   HDL 53 06/21/2021   LDLCALC 112 (H) 06/21/2021   TRIG 171 (H) 06/21/2021   CHOLHDL 3.7 06/21/2021       CKD (chronic kidney disease) stage 3, GFR 30-59 ml/min Stable, needs better hTN control  Obesity (BMI 30.0-34.9)  Patient re-educated about  the importance of commitment to a  minimum of 150 minutes of exercise per week as able.  The importance of healthy food choices with portion control discussed, as well as eating regularly and within a 12 hour window most days. The need to choose "clean , green" food 50 to 75% of the time is discussed, as well as to make water the primary drink and set  a goal of 64 ounces water daily.    Weight /BMI 06/25/2021 03/28/2021 01/21/2021  WEIGHT 220 lb 220 lb 1.3 oz 231 lb  HEIGHT '5\' 10"'$  '5\' 10"'$  '5\' 10"'$   BMI 31.57 kg/m2 31.58 kg/m2 33.15 kg/m2      Elevated TSH No thyromegaly or nodule on exam, add Free T3 and free T4

## 2021-06-27 ENCOUNTER — Other Ambulatory Visit: Payer: Self-pay | Admitting: Family Medicine

## 2021-06-27 LAB — T4, FREE: Free T4: 1.14 ng/dL (ref 0.82–1.77)

## 2021-06-27 LAB — SPECIMEN STATUS REPORT

## 2021-06-27 LAB — T3: T3, Total: 74 ng/dL (ref 71–180)

## 2021-07-01 DIAGNOSIS — M79671 Pain in right foot: Secondary | ICD-10-CM | POA: Diagnosis not present

## 2021-07-01 DIAGNOSIS — M65271 Calcific tendinitis, right ankle and foot: Secondary | ICD-10-CM | POA: Diagnosis not present

## 2021-07-01 DIAGNOSIS — Z1211 Encounter for screening for malignant neoplasm of colon: Secondary | ICD-10-CM | POA: Diagnosis not present

## 2021-07-07 LAB — COLOGUARD: Cologuard: NEGATIVE

## 2021-08-04 ENCOUNTER — Other Ambulatory Visit: Payer: Self-pay | Admitting: Family Medicine

## 2021-08-04 ENCOUNTER — Other Ambulatory Visit: Payer: Self-pay | Admitting: Internal Medicine

## 2021-08-04 DIAGNOSIS — R3912 Poor urinary stream: Secondary | ICD-10-CM

## 2021-08-04 DIAGNOSIS — I10 Essential (primary) hypertension: Secondary | ICD-10-CM

## 2021-08-04 DIAGNOSIS — N401 Enlarged prostate with lower urinary tract symptoms: Secondary | ICD-10-CM

## 2021-08-21 DIAGNOSIS — S92334A Nondisplaced fracture of third metatarsal bone, right foot, initial encounter for closed fracture: Secondary | ICD-10-CM | POA: Diagnosis not present

## 2021-08-21 DIAGNOSIS — S92324A Nondisplaced fracture of second metatarsal bone, right foot, initial encounter for closed fracture: Secondary | ICD-10-CM | POA: Diagnosis not present

## 2021-09-02 ENCOUNTER — Other Ambulatory Visit: Payer: Self-pay | Admitting: Family Medicine

## 2021-09-02 DIAGNOSIS — M84374D Stress fracture, right foot, subsequent encounter for fracture with routine healing: Secondary | ICD-10-CM | POA: Diagnosis not present

## 2021-09-02 DIAGNOSIS — S93402D Sprain of unspecified ligament of left ankle, subsequent encounter: Secondary | ICD-10-CM | POA: Diagnosis not present

## 2021-09-04 DIAGNOSIS — R5383 Other fatigue: Secondary | ICD-10-CM | POA: Diagnosis not present

## 2021-09-04 DIAGNOSIS — M109 Gout, unspecified: Secondary | ICD-10-CM | POA: Diagnosis not present

## 2021-09-04 DIAGNOSIS — E559 Vitamin D deficiency, unspecified: Secondary | ICD-10-CM | POA: Diagnosis not present

## 2021-09-23 DIAGNOSIS — M84374D Stress fracture, right foot, subsequent encounter for fracture with routine healing: Secondary | ICD-10-CM | POA: Diagnosis not present

## 2021-09-24 ENCOUNTER — Other Ambulatory Visit: Payer: Self-pay | Admitting: Family Medicine

## 2021-09-27 ENCOUNTER — Encounter: Payer: PPO | Admitting: Family Medicine

## 2021-09-27 ENCOUNTER — Ambulatory Visit (INDEPENDENT_AMBULATORY_CARE_PROVIDER_SITE_OTHER): Payer: PPO

## 2021-09-27 ENCOUNTER — Other Ambulatory Visit: Payer: Self-pay

## 2021-09-27 DIAGNOSIS — Z0000 Encounter for general adult medical examination without abnormal findings: Secondary | ICD-10-CM | POA: Diagnosis not present

## 2021-09-27 NOTE — Progress Notes (Signed)
I connected with  [name] [name] on 09/27/21 by a audio enabled telemedicine application and verified that I am speaking with the correct person using two identifiers.  Patient Location: Home  Provider Location: Office/Clinic  I discussed the limitations of evaluation and management by telemedicine. The patient expressed understanding and agreed to proceed.  Subjective:   [name] [name] is a 80 y.o. male who presents for Medicare Annual/Subsequent preventive examination.  Review of Systems    Cardiac Risk Factors include: advanced age (>58men, >82 women);dyslipidemia;hypertension;male gender     Objective:    There were no vitals filed for this visit. There is no height or weight on file to calculate BMI.  Advanced Directives 09/26/2020 06/15/2020 05/06/2020 09/13/2018 06/24/2017 12/08/2016 12/05/2016  Does Patient Have a Medical Advance Directive? Yes Yes No No Yes Yes Yes  Type of Paramedic of [place];Living will [place];Living will - - [place];Living will Living will Living will  Does patient want to make changes to medical advance directive? - - - - - No - Patient declined No - Patient declined  Copy of [place] in Chart? No - copy requested No - copy requested - - No - copy requested No - copy requested No - copy requested  Would patient like information on creating a medical advance directive? - - - Yes (ED - Information included in AVS) - No - Patient declined No - Patient declined  Pre-existing out of facility DNR order (yellow form or pink MOST form) - - - - - - -    Current Medications (verified) Outpatient Encounter Medications as of 09/27/2021  Medication Sig   acetaminophen (TYLENOL) 325 MG tablet Take 650 mg by mouth as needed for mild pain.    aspirin (ASPIRIN LOW DOSE) 81 MG EC tablet Take 1 tablet (81 mg total) by mouth every morning.   azelastine (ASTELIN) 0.1 % nasal spray Place  2 sprays into both nostrils 2 (two) times daily. Use in each nostril as directed   finasteride (PROSCAR) 5 MG tablet Take 1 tablet by mouth once daily   hydrochlorothiazide (HYDRODIURIL) 25 MG tablet Take 1 tablet by mouth once daily   Krill Oil 300 MG CAPS Take 300 mg by mouth at bedtime.    loratadine (CLARITIN) 10 MG tablet Take 10 mg by mouth daily.   metoprolol succinate (TOPROL-XL) 25 MG 24 hr tablet Take one tablet by mouth once daily with or immediately following a meal, along with metoprolol 50 mg tablet   metoprolol succinate (TOPROL-XL) 50 MG 24 hr tablet TAKE 1 TABLET BY MOUTH ONCE DAILY WITH  OR  IMMEDIATELY  FOLLOWING  MEAL   Misc Natural Products (BLACK CHERRY CONCENTRATE PO) Take 1 Dose by mouth daily.    Multiple Vitamin (MULTIVITAMIN WITH MINERALS) TABS Take 1 tablet by mouth daily.   nitroGLYCERIN (NITROSTAT) 0.4 MG SL tablet Take 1 tab under your tongue while sitting If no relief of chest pain may repeat one tab every 5 minutes up to 3 tablets total over 15 minutes (Patient taking differently: Place 0.4 mg under the tongue every 5 (five) minutes as needed for chest pain. up to 3 tablets total over 15 minutes)   potassium chloride (KLOR-CON) 10 MEQ tablet Take 1 tablet by mouth once daily   rosuvastatin (CRESTOR) 20 MG tablet Take 1 tablet (20 mg total) by mouth daily.   terazosin (HYTRIN) 2 MG capsule Take 1 capsule by mouth  at bedtime   tiZANidine (ZANAFLEX) 2 MG tablet Take one tablet at bedtime for back spasm for 1 week, then as needed   No facility-administered encounter medications on file as of 09/27/2021.    Allergies (verified) Ace inhibitors   History: Past Medical History:  Diagnosis Date   Anemia, iron deficiency    Annual physical exam 09/01/2016   Cancer ([place])    skin cancers    Educated about COVID-19 virus infection 03/13/2019   ELECTROCARDIOGRAM, ABNORMAL 08/18/2010   Qualifier: Diagnosis of  By: Moshe Cipro MD, Margaret     GERD (gastroesophageal reflux  disease)    H/O hiatal hernia    Headache    Hemorrhoids    and minimal polp   History of kidney stones    History of migraines childhood    Flared up in 2009, and have responded to depakote which was stopped in 9 months   Hx of colonic polyps 02/24/2007   Hyperlipidemia 2003   Hypertension 2003   Low sodium levels 02/20/2017   LUNG NODULE 08/18/2010   Qualifier: Diagnosis of  By: Moshe Cipro MD, Margaret  No need for radiologic f/u unless new symptoms develop    Lung nodules since 2009   CT scan advised in 08/2010   Neoplasm    dermal   Nonspecific abnormal findings on radiological and other examination of skull and head 08/18/2010   Qualifier: Diagnosis of  By: Moshe Cipro MD, Margaret  Scan of 08/2010 showed no change over a 2 yr period, no need for further scanning.Pt is a non smoker, will not rept unless symptomatic    Overweight(278.02)    Past Surgical History:  Procedure Laterality Date   COLONOSCOPY  2008   Fields-mod IH, 78mm polyp sigmoid colon (disappeared w/ insufflation & could not be retrieved)   COLONOSCOPY  05/19/2012   Procedure: COLONOSCOPY;  Surgeon: Danie Binder, MD;  [facility];  Service: Endoscopy;  Laterality: N/A;  8:30   CYSTOSCOPY W/ URETERAL STENT PLACEMENT Left 11/27/2016   Procedure: CYSTOSCOPY WITH RETROGRADE PYELOGRAM/URETERAL STENT PLACEMENT;  Surgeon: Bjorn Loser, MD;  Location: [facility];  Service: Urology;  Laterality: Left;   ESOPHAGOGASTRODUODENOSCOPY  01/05/2007   Fields-sm HH, benign SB biopsy   EXTRACORPOREAL SHOCK WAVE LITHOTRIPSY Left 12/08/2016   Procedure: LEFT EXTRACORPOREAL SHOCK WAVE LITHOTRIPSY (ESWL);  Surgeon: Festus Aloe, MD;  Location: [facility];  Service: Urology;  Laterality: Left;   GIVENS CAPSULE STUDY  01/20/2007   Fields-nothing to explain IDA, lymphectasias   LEFT HEART CATH AND CORONARY ANGIOGRAPHY N/A 06/15/2020   Procedure: LEFT HEART CATH AND CORONARY ANGIOGRAPHY;  Surgeon: Martinique, Peter M, MD;  Location: [facility];  Service: Cardiovascular;  Laterality: N/A;   removal of skin cancer twice     Family History  Problem Relation Age of Onset   Heart defect Mother        pacemaker    Alzheimer's disease Mother 83       2011   Heart defect Brother        bradycardia    Heart failure Father    Asthma Father    Hypertension Daughter    Kidney Stones Daughter    Colon cancer Neg Hx    Social History   Socioeconomic History   Marital status: Married    Spouse name: brenda    Number of children: 1   Years of education: 10   Highest education level: 10th grade  Occupational History   Occupation: retired,  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  Tobacco Use   Smoking status: Former    Packs/day: 2.00    Years: 15.00    Pack years: 30.00    Types: Cigarettes    Quit date: 10/20/1978    Years since quitting: 42.9   Smokeless tobacco: Former    Types: Chew    Quit date: 10/20/2004  Vaping Use   Vaping Use: Never used  Substance and Sexual Activity   Alcohol use: No   Drug use: No   Sexual activity: Yes  Other Topics Concern   Not on file  Social History Narrative   1 healthy daughter   Lives alone with wife and dog    Social Determinants of Health   Financial Resource Strain: Low Risk    Difficulty of Paying Living Expenses: Not hard at all  Food Insecurity: No Food Insecurity   Worried About Charity fundraiser in the Last Year: Never true   [place] in the Last Year: Never true  Transportation Needs: No Transportation Needs   Lack of Transportation (Medical): No   Lack of Transportation (Non-Medical): No  Physical Activity: Inactive   Days of Exercise per Week: 0 days   Minutes of Exercise per Session: 0 min  Stress: Not on file  Social Connections: Not on file    Tobacco Counseling Counseling given: Not Answered   Clinical Intake:  Pre-visit preparation completed: Yes  Pain : No/denies pain     Nutritional Status: BMI 25 -29 Overweight Diabetes:  No  How often do you need to have someone help you when you read instructions, pamphlets, or other written materials from your doctor or pharmacy?: 1 - Never  Diabetic?no          Activities of Daily Living In your present state of health, do you have any difficulty performing the following activities: 09/27/2021  Hearing? N  Vision? N  Difficulty concentrating or making decisions? N  Walking or climbing stairs? N  Dressing or bathing? N  Doing errands, shopping? N  Preparing Food and eating ? N  Using the Toilet? N  In the past six months, have you accidently leaked urine? N  Do you have problems with loss of bowel control? N  Managing your Medications? N  Managing your Finances? N  Housekeeping or managing your Housekeeping? N  Some recent data might be hidden    Patient Care Team: Fayrene Helper, MD as PCP - [facility], Marga Melnick, MD (Inactive) (Gastroenterology) Sandford Craze, MD as Referring Physician (Dermatology)  Indicate any recent Medical Services you may have received from other than [facility] providers in the past year (date may be approximate).     Assessment:   This is a routine wellness examination for [name] [name].  Hearing/Vision screen No results found.  Dietary issues and exercise activities discussed: Current Exercise Habits: The patient does not participate in regular exercise at present, Time (Minutes): 10, Frequency (Times/Week): 1, Weekly Exercise (Minutes/Week): 10, Intensity: Mild, Exercise limited by: orthopedic condition(s)   Goals Addressed             This Visit's Progress    DIET - INCREASE WATER INTAKE   On track    Exercise 3x per week (30 min per time)   Not on track    Recommend starting a routine exercise program at least 3 days a week for 30-45 minutes at a time as tolerated.  Will start once pain is less  in feet         Depression Screen PHQ 2/9 Scores 06/25/2021 03/28/2021 01/21/2021 01/21/2021 10/11/2020 09/26/2020 09/26/2020   PHQ - 2 Score 0 0 0 0 0 0 0  PHQ- 9 Score - - - - - - -    Fall Risk Fall Risk  06/25/2021 03/28/2021 01/21/2021 10/11/2020 09/26/2020  Falls in the past year? 0 0 0 0 0  Number falls in past yr: 0 0 0 0 0  Injury with Fall? 0 0 0 0 0  Risk for fall due to : - No Fall Risks No Fall Risks No Fall Risks No Fall Risks  Follow up - Falls evaluation completed Falls evaluation completed Falls evaluation completed Falls evaluation completed    FALL RISK PREVENTION PERTAINING TO THE HOME:  Any stairs in or around the home? Yes  If so, are there any without handrails? Yes  Home free of loose throw rugs in walkways, pet beds, electrical cords, etc? Yes  Adequate lighting in your home to reduce risk of falls? Yes   ASSISTIVE DEVICES UTILIZED TO PREVENT FALLS:  Life alert? No  Use of a cane, walker or w/c? Yes  Grab bars in the bathroom? Yes  Shower chair or bench in shower? Yes  Elevated toilet seat or a handicapped toilet? Yes   Cognitive Function:     6CIT Screen 09/27/2021 09/26/2020 09/26/2019 09/13/2018 06/24/2017  What Year? 0 points 0 points 0 points 0 points 0 points  What month? 0 points 0 points 0 points 0 points 0 points  What time? - 0 points 0 points 0 points 0 points  Count back from 20 - 0 points 0 points 0 points 0 points  Months in reverse - 0 points 0 points 0 points 0 points  Repeat phrase - 0 points 0 points 2 points 0 points  Total Score - 0 0 2 0    Immunizations Immunization History  Administered Date(s) Administered   Fluad Quad(high Dose 65+) 07/25/2019, 07/27/2020, 06/25/2021   Influenza Whole 08/12/2010, 08/11/2011   Influenza, High Dose Seasonal PF 07/28/2018   Influenza,inj,Quad PF,6+ Mos 07/27/2013, 07/27/2014, 08/07/2015, 09/01/2016, 08/26/2017   Moderna SARS-COV2 Booster Vaccination 11/01/2019   Pneumococcal Conjugate-13 05/01/2014   Pneumococcal Polysaccharide-23 04/29/2013   Tdap 08/11/2011   Unspecified SARS-COV-2 Vaccination 11/01/2019, 12/02/2019,  09/06/2020   Zoster, Live 04/10/2014    TDAP status: Up to date  Flu Vaccine status: Up to date  Pneumococcal vaccine status: Up to date  Covid-19 vaccine status: Completed vaccines  Qualifies for Shingles Vaccine? Yes   Zostavax completed No   Shingrix Completed?: No.    Education has been provided regarding the importance of this vaccine. Patient has been advised to call insurance company to determine out of pocket expense if they have not yet received this vaccine. Advised may also receive vaccine at local pharmacy or Health Dept. Verbalized acceptance and understanding.  Screening Tests Health Maintenance  Topic Date Due   Zoster Vaccines- Shingrix (1 of 2) Never done   COVID-19 Vaccine (4 - Booster) 11/01/2020   TETANUS/TDAP  08/10/2021   Pneumonia Vaccine 69+ Years old  Completed   INFLUENZA VACCINE  Completed   Hepatitis C Screening  Completed   HPV VACCINES  Aged Out    Health Maintenance  Health Maintenance Due  Topic Date Due   Zoster Vaccines- Shingrix (1 of 2) Never done   COVID-19 Vaccine (4 - Booster) 11/01/2020   TETANUS/TDAP  08/10/2021    Colorectal cancer  screening: Type of screening: Cologuard. Completed yes. Repeat every 3 years  Lung Cancer Screening: (Low Dose CT Chest recommended if Age 37-80 years, 30 pack-year currently smoking OR have quit w/in 15years.) does not qualify.   Lung Cancer Screening Referral: na  Additional Screening:  Hepatitis C Screening: does qualify; Completed yes  Vision Screening: Recommended annual ophthalmology exams for early detection of glaucoma and other disorders of the eye. Is the patient up to date with their annual eye exam?  Yes  Who is the provider or what is the name of the office in which the patient attends annual eye exams? yes If pt is not established with a provider, would they like to be referred to a provider to establish care? No .   Dental Screening: Recommended annual dental exams for proper oral  hygiene  Community Resource Referral / Chronic Care Management: CRR required this visit?  No   CCM required this visit?  No      Plan:     I have personally reviewed and noted the following in the patient's chart:   Medical and social history Use of alcohol, tobacco or illicit drugs  Current medications and supplements including opioid prescriptions. Patient is not currently taking opioid prescriptions. Functional ability and status Nutritional status Physical activity Advanced directives List of other physicians Hospitalizations, surgeries, and ER visits in previous 12 months Vitals Screenings to include cognitive, depression, and falls Referrals and appointments  In addition, I have reviewed and discussed with patient certain preventive protocols, quality metrics, and best practice recommendations. A written personalized care plan for preventive services as well as general preventive health recommendations were provided to patient.     Kate Sable, LPN, LPN   [id number]/[id number]   Nurse Notes:  [name] [name] , Thank you for taking time to come for your Medicare Wellness Visit. I appreciate your ongoing commitment to your health goals. Please review the following plan we discussed and let me know if I can assist you in the future.   These are the goals we discussed:  Goals      DIET - INCREASE WATER INTAKE     Exercise 3x per week (30 min per time)     Recommend starting a routine exercise program at least 3 days a week for 30-45 minutes at a time as tolerated.  Will start once pain is less in feet          This is a list of the screening recommended for you and due dates:  Health Maintenance  Topic Date Due   Zoster (Shingles) Vaccine (1 of 2) Never done   COVID-19 Vaccine (4 - Booster) 11/01/2020   Tetanus Vaccine  08/10/2021   Pneumonia Vaccine  Completed   Flu Shot  Completed   Hepatitis C Screening: USPSTF Recommendation to screen - Ages 18-79 yo.  Completed   HPV  Vaccine  Aged Out

## 2021-09-27 NOTE — Patient Instructions (Addendum)
[name] [name] , Thank you for taking time to come for your Medicare Wellness Visit. I appreciate your ongoing commitment to your health goals. Please review the following plan we discussed and let me know if I can assist you in the future.   These are the goals we discussed:  Goals      DIET - INCREASE WATER INTAKE     Exercise 3x per week (30 min per time)     Recommend starting a routine exercise program at least 3 days a week for 30-45 minutes at a time as tolerated.  Will start once pain is less in feet          This is a list of the screening recommended for you and due dates:  Health Maintenance  Topic Date Due   Zoster (Shingles) Vaccine (1 of 2) Never done   COVID-19 Vaccine (4 - Booster) 11/01/2020   Tetanus Vaccine  08/10/2021   Pneumonia Vaccine  Completed   Flu Shot  Completed   Hepatitis C Screening: USPSTF Recommendation to screen - Ages 18-79 yo.  Completed   HPV Vaccine  Aged Out      Health Maintenance, Male Adopting a healthy lifestyle and getting preventive care are important in promoting health and wellness. Ask your health care provider about: The right schedule for you to have regular tests and exams. Things you can do on your own to prevent diseases and keep yourself healthy. What should I know about diet, weight, and exercise? Eat a healthy diet  Eat a diet that includes plenty of vegetables, fruits, low-fat dairy products, and lean protein. Do not eat a lot of foods that are high in solid fats, added sugars, or sodium. Maintain a healthy weight Body mass index (BMI) is a measurement that can be used to identify possible weight problems. It estimates body fat based on height and weight. Your health care provider can help determine your BMI and help you achieve or maintain a healthy weight. Get regular exercise Get regular exercise. This is one of the most important things you can do for your health. Most adults should: Exercise for at least 150 minutes  each week. The exercise should increase your heart rate and make you sweat (moderate-intensity exercise). Do strengthening exercises at least twice a week. This is in addition to the moderate-intensity exercise. Spend less time sitting. Even light physical activity can be beneficial. Watch cholesterol and blood lipids Have your blood tested for lipids and cholesterol at 79 years of age, then have this test every 5 years. You may need to have your cholesterol levels checked more often if: Your lipid or cholesterol levels are high. You are older than 79 years of age. You are at high risk for heart disease. What should I know about cancer screening? Many types of cancers can be detected early and may often be prevented. Depending on your health history and family history, you may need to have cancer screening at various ages. This may include screening for: Colorectal cancer. Prostate cancer. Skin cancer. Lung cancer. What should I know about heart disease, diabetes, and high blood pressure? Blood pressure and heart disease High blood pressure causes heart disease and increases the risk of stroke. This is more likely to develop in people who have high blood pressure readings or are overweight. Talk with your health care provider about your target blood pressure readings. Have your blood pressure checked: Every 3-5 years if you are 34-74 years of age. Every year  if you are 65 years old or older. If you are between the ages of 43 and 31 and are a current or former smoker, ask your health care provider if you should have a one-time screening for abdominal aortic aneurysm (AAA). Diabetes Have regular diabetes screenings. This checks your fasting blood sugar level. Have the screening done: Once every three years after age 27 if you are at a normal weight and have a low risk for diabetes. More often and at a younger age if you are overweight or have a high risk for diabetes. What should I know  about preventing infection? Hepatitis B If you have a higher risk for hepatitis B, you should be screened for this virus. Talk with your health care provider to find out if you are at risk for hepatitis B infection. Hepatitis C Blood testing is recommended for: Everyone born from [birth year] through 1964. Anyone with known risk factors for hepatitis C. Sexually transmitted infections (STIs) You should be screened each year for STIs, including gonorrhea and chlamydia, if: You are sexually active and are younger than 79 years of age. You are older than 79 years of age and your health care provider tells you that you are at risk for this type of infection. Your sexual activity has changed since you were last screened, and you are at increased risk for chlamydia or gonorrhea. Ask your health care provider if you are at risk. Ask your health care provider about whether you are at high risk for HIV. Your health care provider may recommend a prescription medicine to help prevent HIV infection. If you choose to take medicine to prevent HIV, you should first get tested for HIV. You should then be tested every 3 months for as long as you are taking the medicine. Follow these instructions at home: Alcohol use Do not drink alcohol if your health care provider tells you not to drink. If you drink alcohol: Limit how much you have to 0-2 drinks a day. Know how much alcohol is in your drink. In the [place] one drink equals one 12 oz bottle of beer (355 mL), one 5 oz glass of wine (148 mL), or one 1 oz glass of hard liquor (44 mL). Lifestyle Do not use any products that contain nicotine or tobacco. These products include cigarettes, chewing tobacco, and vaping devices, such as e-cigarettes. If you need help quitting, ask your health care provider. Do not use street drugs. Do not share needles. Ask your health care provider for help if you need support or information about quitting drugs. General  instructions Schedule regular health, dental, and eye exams. Stay current with your vaccines. Tell your health care provider if: You often feel depressed. You have ever been abused or do not feel safe at home. Summary Adopting a healthy lifestyle and getting preventive care are important in promoting health and wellness. Follow your health care provider's instructions about healthy diet, exercising, and getting tested or screened for diseases. Follow your health care provider's instructions on monitoring your cholesterol and blood pressure. This information is not intended to replace advice given to you by your health care provider. Make sure you discuss any questions you have with your health care provider. Document Revised: 02/25/2021 Document Reviewed: 02/25/2021 Elsevier Patient Education  [place].

## 2021-09-30 ENCOUNTER — Other Ambulatory Visit: Payer: Self-pay | Admitting: Family Medicine

## 2021-10-01 ENCOUNTER — Encounter: Payer: PPO | Admitting: Family Medicine

## 2021-10-16 ENCOUNTER — Encounter: Payer: PPO | Admitting: Family Medicine

## 2021-10-21 DIAGNOSIS — E782 Mixed hyperlipidemia: Secondary | ICD-10-CM | POA: Diagnosis not present

## 2021-10-21 DIAGNOSIS — I10 Essential (primary) hypertension: Secondary | ICD-10-CM | POA: Diagnosis not present

## 2021-10-21 DIAGNOSIS — Z125 Encounter for screening for malignant neoplasm of prostate: Secondary | ICD-10-CM | POA: Diagnosis not present

## 2021-10-22 ENCOUNTER — Other Ambulatory Visit: Payer: Self-pay | Admitting: Family Medicine

## 2021-10-24 ENCOUNTER — Ambulatory Visit (INDEPENDENT_AMBULATORY_CARE_PROVIDER_SITE_OTHER): Payer: PPO | Admitting: Nurse Practitioner

## 2021-10-24 ENCOUNTER — Encounter: Payer: Self-pay | Admitting: Nurse Practitioner

## 2021-10-24 ENCOUNTER — Other Ambulatory Visit: Payer: Self-pay

## 2021-10-24 VITALS — BP 152/80 | HR 93 | Ht 70.08 in | Wt 223.1 lb

## 2021-10-24 DIAGNOSIS — M25571 Pain in right ankle and joints of right foot: Secondary | ICD-10-CM

## 2021-10-24 DIAGNOSIS — Z0000 Encounter for general adult medical examination without abnormal findings: Secondary | ICD-10-CM | POA: Insufficient documentation

## 2021-10-24 DIAGNOSIS — M109 Gout, unspecified: Secondary | ICD-10-CM | POA: Insufficient documentation

## 2021-10-24 DIAGNOSIS — I10 Essential (primary) hypertension: Secondary | ICD-10-CM

## 2021-10-24 DIAGNOSIS — M1A9XX1 Chronic gout, unspecified, with tophus (tophi): Secondary | ICD-10-CM

## 2021-10-24 MED ORDER — ALLOPURINOL 100 MG PO TABS: 50.0000 mg | ORAL_TABLET | Freq: Every day | ORAL | 3 refills | Status: DC

## 2021-10-24 NOTE — Progress Notes (Addendum)
° °  TREVYON SWOR     MRN: [id number]      DOB: March 27, 1941   HPI Mr. Cothron is here for annual physical.   Pt c/o of injury to his right and left ankles 3 weeks ago, he has seen othopedics about this.   Pt c/o of gout attack on his right great toe, he was prescribed colchicine by his othopedics, but he did not take all the med due to concerns about his kidney function. Still having pain on his ankle and right big toe.   Due for covid booster, shingles vaccine, tdap vaccine.  Cologuard test was negative, had his eye exam 6 months ago, wear prescription glasses . PT stated that he had his blood work 2 days ago, lab results are not back.   ROS Denies recent fever or chills. Denies sinus pressure, nasal congestion, ear pain or sore throat. Denies chest congestion, productive cough or wheezing. Denies chest pains, palpitations and leg swelling Denies abdominal pain, nausea, vomiting,diarrhea or constipation.   Denies dysuria, frequency, hesitancy or incontinence. Has bilateral ankle joint pain , gout on right toe, swelling and limitation in mobility. Denies headaches, seizures, numbness, or tingling. Denies depression, anxiety or insomnia. Denies skin break down or rash.   PE  BP (!) 152/80 (BP Location: Right Arm, Cuff Size: Normal)    Pulse 93    Ht 5' 10.08" (1.78 m)    Wt 223 lb 1.9 oz (101.2 kg)    SpO2 92%    BMI 31.94 kg/m   Patient alert and oriented and in no cardiopulmonary distress.  HEENT: No facial asymmetry, EOMI,     Neck supple .  Chest: Clear to auscultation bilaterally.  CVS: S1, S2 no murmurs, no S3.Regular rate.  ABD: Soft non tender.   Ext: No edema,  MS: Adequate ROM spine, shoulders, hips and knees.uses a cane, bunions on bilateral feet, tenderness on ROM of bilateral ankle, right one worse, tenderness of right great toe. Uses a cane   Skin: Intact, no ulcerations or rash noted.  Psych: Good eye contact, normal affect. Memory intact not anxious or  depressed appearing.  CNS: CN 2-12 intact, power,  normal throughout.no focal deficits noted.   Assessment & Plan

## 2021-10-24 NOTE — Assessment & Plan Note (Signed)
Start allopurinol $RemoveBeforeDEI'50mg'goNDWcUELnHapTKk$  daily for 3 months. He was prescribed colchicine by his podiatrist but did not use all due to his kidney function.  Last EGFR ws 41. EGFR pending

## 2021-10-24 NOTE — Patient Instructions (Addendum)
Please get your covid booster, shingles vaccine, tdap vaccine.   Take allopurinol 50mg  daily for your gout.    It is important that you exercise regularly at least 30 minutes 5 times a week.  Think about what you will eat, plan ahead. Choose " clean, green, fresh or frozen" over canned, processed or packaged foods which are more sugary, salty and fatty. 70 to 75% of food eaten should be vegetables and fruit. Three meals at set times with snacks allowed between meals, but they must be fruit or vegetables. Aim to eat over a 12 hour period , example 7 am to 7 pm, and STOP after  your last meal of the day. Drink water,generally about 64 ounces per day, no other drink is as healthy. Fruit juice is best enjoyed in a healthy way, by EATING the fruit.  Thanks for choosing [facility], we consider it a privelige to serve you.

## 2021-10-24 NOTE — Assessment & Plan Note (Signed)
DASH diet and commitment to daily physical activity for a minimum of 30 minutes discussed and encouraged, as a part of hypertension management. The importance of attaining a healthy weight is also discussed.  BP/Weight 10/24/2021 06/25/2021 03/28/2021 01/21/2021 10/11/2020 09/26/2020 61/95/0932  Systolic BP 671 245 809 983 382 505 397  Diastolic BP 80 92 75 88 89 93 93  Wt. (Lbs) 223.12 220 220.08 231 224.12 222 220.12  BMI 31.94 31.57 31.58 33.15 32.16 31.85 31.58

## 2021-10-24 NOTE — Assessment & Plan Note (Signed)
Annual exam as documented.  ?Counseling done include healthy lifestyle involving committing to 150 minutes of exercise per week, heart healthy diet, and attaining healthy weight. The importance of adequate sleep also discussed.  ?Regular use of seat belt and home safety were also discussed . ?Changes in health habits are decided on by patient with goals and time frames set for achieving them. ?Immunization and cancer screening  needs are specifically addressed at this visit.   ?

## 2021-10-24 NOTE — Assessment & Plan Note (Signed)
He has seen othopedics, takes tylenol as needed for his pain. Continue tylenol as needed for pain and follow up with otho if pain gets worse.

## 2021-10-25 DIAGNOSIS — M25572 Pain in left ankle and joints of left foot: Secondary | ICD-10-CM | POA: Diagnosis not present

## 2021-10-25 DIAGNOSIS — M25671 Stiffness of right ankle, not elsewhere classified: Secondary | ICD-10-CM | POA: Diagnosis not present

## 2021-10-25 DIAGNOSIS — M25571 Pain in right ankle and joints of right foot: Secondary | ICD-10-CM | POA: Diagnosis not present

## 2021-10-25 DIAGNOSIS — M25672 Stiffness of left ankle, not elsewhere classified: Secondary | ICD-10-CM | POA: Diagnosis not present

## 2021-10-25 LAB — URIC ACID: Uric Acid: 6.9 mg/dL (ref 3.8–8.4)

## 2021-10-28 DIAGNOSIS — M25671 Stiffness of right ankle, not elsewhere classified: Secondary | ICD-10-CM | POA: Diagnosis not present

## 2021-10-28 DIAGNOSIS — M25572 Pain in left ankle and joints of left foot: Secondary | ICD-10-CM | POA: Diagnosis not present

## 2021-10-28 DIAGNOSIS — M25571 Pain in right ankle and joints of right foot: Secondary | ICD-10-CM | POA: Diagnosis not present

## 2021-10-28 DIAGNOSIS — M25672 Stiffness of left ankle, not elsewhere classified: Secondary | ICD-10-CM | POA: Diagnosis not present

## 2021-10-29 ENCOUNTER — Other Ambulatory Visit: Payer: Self-pay | Admitting: Internal Medicine

## 2021-10-29 DIAGNOSIS — N401 Enlarged prostate with lower urinary tract symptoms: Secondary | ICD-10-CM

## 2021-10-29 DIAGNOSIS — R3912 Poor urinary stream: Secondary | ICD-10-CM

## 2021-10-30 DIAGNOSIS — M25672 Stiffness of left ankle, not elsewhere classified: Secondary | ICD-10-CM | POA: Diagnosis not present

## 2021-10-30 DIAGNOSIS — M25572 Pain in left ankle and joints of left foot: Secondary | ICD-10-CM | POA: Diagnosis not present

## 2021-10-30 DIAGNOSIS — M25571 Pain in right ankle and joints of right foot: Secondary | ICD-10-CM | POA: Diagnosis not present

## 2021-10-30 DIAGNOSIS — M25671 Stiffness of right ankle, not elsewhere classified: Secondary | ICD-10-CM | POA: Diagnosis not present

## 2021-11-01 LAB — PSA: Prostate Specific Ag, Serum: 0.7 ng/mL (ref 0.0–4.0)

## 2021-11-01 LAB — LIPID PANEL
Chol/HDL Ratio: 3.1 ratio (ref 0.0–5.0)
Cholesterol, Total: 151 mg/dL (ref 100–199)
HDL: 49 mg/dL (ref >39)
LDL Chol Calc (NIH): 73 mg/dL (ref 0–99)
Triglycerides: 171 mg/dL — ABNORMAL HIGH (ref 0–149)
VLDL Cholesterol Cal: 29 mg/dL (ref 5–40)

## 2021-11-01 LAB — CMP14+EGFR
ALT: 18 IU/L (ref 0–44)
AST: 23 IU/L (ref 0–40)
Albumin/Globulin Ratio: 2 (ref 1.2–2.2)
Albumin: 4.5 g/dL (ref 3.7–4.7)
Alkaline Phosphatase: 65 IU/L (ref 44–121)
BUN/Creatinine Ratio: 7 — ABNORMAL LOW (ref 10–24)
BUN: 11 mg/dL (ref 8–27)
Bilirubin Total: 0.3 mg/dL (ref 0.0–1.2)
CO2: 22 mmol/L (ref 20–29)
Calcium: 9.1 mg/dL (ref 8.6–10.2)
Chloride: 106 mmol/L (ref 96–106)
Creatinine, Ser: 1.49 mg/dL — ABNORMAL HIGH (ref 0.76–1.27)
Globulin, Total: 2.2 g/dL (ref 1.5–4.5)
Glucose: 85 mg/dL (ref 70–99)
Potassium: 4.9 mmol/L (ref 3.5–5.2)
Sodium: 144 mmol/L (ref 134–144)
Total Protein: 6.7 g/dL (ref 6.0–8.5)
eGFR: 47 mL/min/{1.73_m2} — ABNORMAL LOW (ref >59)

## 2021-11-01 LAB — CBC
Hematocrit: 38.1 % (ref 37.5–51.0)
Hemoglobin: 12.5 g/dL — ABNORMAL LOW (ref 13.0–17.7)
MCH: 28.8 pg (ref 26.6–33.0)
MCHC: 32.8 g/dL (ref 31.5–35.7)
MCV: 88 fL (ref 79–97)
Platelets: 193 10*3/uL (ref 150–450)
RBC: 4.34 x10E6/uL (ref 4.14–5.80)
RDW: 14.3 % (ref 11.6–15.4)
WBC: 6.6 10*3/uL (ref 3.4–10.8)

## 2021-11-06 ENCOUNTER — Other Ambulatory Visit: Payer: Self-pay | Admitting: Internal Medicine

## 2021-11-06 DIAGNOSIS — M25571 Pain in right ankle and joints of right foot: Secondary | ICD-10-CM | POA: Diagnosis not present

## 2021-11-06 DIAGNOSIS — M25672 Stiffness of left ankle, not elsewhere classified: Secondary | ICD-10-CM | POA: Diagnosis not present

## 2021-11-06 DIAGNOSIS — M25572 Pain in left ankle and joints of left foot: Secondary | ICD-10-CM | POA: Diagnosis not present

## 2021-11-06 DIAGNOSIS — M25671 Stiffness of right ankle, not elsewhere classified: Secondary | ICD-10-CM | POA: Diagnosis not present

## 2021-11-06 DIAGNOSIS — R3912 Poor urinary stream: Secondary | ICD-10-CM

## 2021-11-06 DIAGNOSIS — N401 Enlarged prostate with lower urinary tract symptoms: Secondary | ICD-10-CM

## 2021-11-08 DIAGNOSIS — M25572 Pain in left ankle and joints of left foot: Secondary | ICD-10-CM | POA: Diagnosis not present

## 2021-11-08 DIAGNOSIS — M25571 Pain in right ankle and joints of right foot: Secondary | ICD-10-CM | POA: Diagnosis not present

## 2021-11-08 DIAGNOSIS — M25672 Stiffness of left ankle, not elsewhere classified: Secondary | ICD-10-CM | POA: Diagnosis not present

## 2021-11-08 DIAGNOSIS — M25671 Stiffness of right ankle, not elsewhere classified: Secondary | ICD-10-CM | POA: Diagnosis not present

## 2021-11-15 DIAGNOSIS — M25572 Pain in left ankle and joints of left foot: Secondary | ICD-10-CM | POA: Diagnosis not present

## 2021-11-15 DIAGNOSIS — M25571 Pain in right ankle and joints of right foot: Secondary | ICD-10-CM | POA: Diagnosis not present

## 2021-11-15 DIAGNOSIS — M25671 Stiffness of right ankle, not elsewhere classified: Secondary | ICD-10-CM | POA: Diagnosis not present

## 2021-11-15 DIAGNOSIS — M25672 Stiffness of left ankle, not elsewhere classified: Secondary | ICD-10-CM | POA: Diagnosis not present

## 2021-11-20 ENCOUNTER — Other Ambulatory Visit: Payer: Self-pay | Admitting: Family Medicine

## 2021-11-22 ENCOUNTER — Ambulatory Visit (INDEPENDENT_AMBULATORY_CARE_PROVIDER_SITE_OTHER): Payer: PPO

## 2021-11-22 ENCOUNTER — Ambulatory Visit: Payer: PPO | Admitting: Podiatry

## 2021-11-22 ENCOUNTER — Other Ambulatory Visit: Payer: Self-pay

## 2021-11-22 ENCOUNTER — Other Ambulatory Visit: Payer: Self-pay | Admitting: Podiatry

## 2021-11-22 DIAGNOSIS — M2011 Hallux valgus (acquired), right foot: Secondary | ICD-10-CM

## 2021-11-22 DIAGNOSIS — M2012 Hallux valgus (acquired), left foot: Secondary | ICD-10-CM

## 2021-11-22 DIAGNOSIS — M7751 Other enthesopathy of right foot: Secondary | ICD-10-CM | POA: Diagnosis not present

## 2021-11-22 DIAGNOSIS — M7752 Other enthesopathy of left foot: Secondary | ICD-10-CM

## 2021-11-22 DIAGNOSIS — M21612 Bunion of left foot: Secondary | ICD-10-CM

## 2021-11-22 MED ORDER — BETAMETHASONE SOD PHOS & ACET 6 (3-3) MG/ML IJ SUSP
3.0000 mg | Freq: Once | INTRAMUSCULAR | Status: AC
  Administered 2021-11-22: 3 mg via INTRA_ARTICULAR

## 2021-11-22 MED ORDER — METHYLPREDNISOLONE 4 MG PO TBPK: ORAL_TABLET | ORAL | 0 refills | Status: DC

## 2021-11-22 NOTE — Progress Notes (Signed)
HPI: 81 y.o. male presenting today PMHx renal insufficiency presenting today for evaluation of an acute flareup of bilateral great toe gout.  Patient states that the gout is flared up over the past month.  He does have a history of gout.  Injections in the past helped significantly.  He is requesting injection today  Past Medical History:  Diagnosis Date   Anemia, iron deficiency    Annual physical exam 09/01/2016   Cancer ([place])    skin cancers    Educated about COVID-19 virus infection 03/13/2019   ELECTROCARDIOGRAM, ABNORMAL 08/18/2010   Qualifier: Diagnosis of  By: Moshe Cipro MD, Margaret     GERD (gastroesophageal reflux disease)    H/O hiatal hernia    Headache    Hemorrhoids    and minimal polp   History of kidney stones    History of migraines childhood    Flared up in 2009, and have responded to depakote which was stopped in 9 months   Hx of colonic polyps 02/24/2007   Hyperlipidemia 2003   Hypertension 2003   Low sodium levels 02/20/2017   LUNG NODULE 08/18/2010   Qualifier: Diagnosis of  By: Moshe Cipro MD, Margaret  No need for radiologic f/u unless new symptoms develop    Lung nodules since 2009   CT scan advised in 08/2010   Neoplasm    dermal   Nonspecific abnormal findings on radiological and other examination of skull and head 08/18/2010   Qualifier: Diagnosis of  By: Moshe Cipro MD, Margaret  Scan of 08/2010 showed no change over a 2 yr period, no need for further scanning.Pt is a non smoker, will not rept unless symptomatic    Overweight(278.02)     Past Surgical History:  Procedure Laterality Date   COLONOSCOPY  2008   Fields-mod IH, 55mm polyp sigmoid colon (disappeared w/ insufflation & could not be retrieved)   COLONOSCOPY  05/19/2012   Procedure: COLONOSCOPY;  Surgeon: Danie Binder, MD;  Location: [facility];  Service: Endoscopy;  Laterality: N/A;  8:30   CYSTOSCOPY W/ URETERAL STENT PLACEMENT Left 11/27/2016   Procedure: CYSTOSCOPY WITH RETROGRADE  PYELOGRAM/URETERAL STENT PLACEMENT;  Surgeon: Bjorn Loser, MD;  Location: [facility];  Service: Urology;  Laterality: Left;   ESOPHAGOGASTRODUODENOSCOPY  01/05/2007   Fields-sm HH, benign SB biopsy   EXTRACORPOREAL SHOCK WAVE LITHOTRIPSY Left 12/08/2016   Procedure: LEFT EXTRACORPOREAL SHOCK WAVE LITHOTRIPSY (ESWL);  Surgeon: Festus Aloe, MD;  Location: [facility];  Service: Urology;  Laterality: Left;   GIVENS CAPSULE STUDY  01/20/2007   Fields-nothing to explain IDA, lymphectasias   LEFT HEART CATH AND CORONARY ANGIOGRAPHY N/A 06/15/2020   Procedure: LEFT HEART CATH AND CORONARY ANGIOGRAPHY;  Surgeon: Martinique, Peter M, MD;  Location: [place] [facility];  Service: Cardiovascular;  Laterality: N/A;   removal of skin cancer twice      Allergies  Allergen Reactions   Ace Inhibitors Cough    Family member not sure if he is allergic or not.     Physical Exam: General: The patient is alert and oriented x3 in no acute distress.  Dermatology: Skin is warm, dry and supple bilateral lower extremities. Negative for open lesions or macerations.  Vascular: Palpable pedal pulses bilaterally. Capillary refill within normal limits.  There is some very mild edema and erythema localized to the first MTP bilateral consistent with acute gout  Neurological: Light touch and protective threshold grossly intact  Musculoskeletal Exam: Hallux valgus deformity noted bilateral.  Inflammation with pain on  palpation range of motion of the first MTP joint bilateral.  Radiographic Exam:  Normal osseous mineralization.  Diffuse degenerative changes noted throughout the foot.  Hallux valgus with an increased intermetatarsal angle noted bilateral.  No fractures identified.  Assessment: 1.  Acute inflammatory gout/capsulitis first MTP bilateral 2.  Hallux valgus bilateral   Plan of Care:  1. Patient evaluated. X-Rays reviewed.  2.  Injection of 0.5 cc Celestone Soluspan injected into the first MTP bilateral 3.   Patient has history of renal insufficiency.  He states that his PCP would rather him not be on any NSAIDs. 4.  Prescription for Medrol Dosepak 5.  Return to clinic 4 weeks      Edrick Kins, DPM [facility]  Dr. Edrick Kins, DPM    [address], [place] [address]                Office [phone]  Fax [phone]

## 2021-11-29 ENCOUNTER — Other Ambulatory Visit: Payer: Self-pay | Admitting: Family Medicine

## 2021-11-29 ENCOUNTER — Ambulatory Visit: Payer: PPO | Admitting: Podiatry

## 2021-11-30 ENCOUNTER — Other Ambulatory Visit: Payer: Self-pay | Admitting: Family Medicine

## 2021-12-04 ENCOUNTER — Encounter: Payer: PPO | Admitting: Family Medicine

## 2021-12-12 ENCOUNTER — Other Ambulatory Visit: Payer: Self-pay | Admitting: Family Medicine

## 2021-12-12 MED ORDER — METOPROLOL SUCCINATE ER 25 MG PO TB24: 25.0000 mg | ORAL_TABLET | Freq: Every day | ORAL | 3 refills | Status: DC

## 2021-12-15 ENCOUNTER — Other Ambulatory Visit: Payer: Self-pay | Admitting: Family Medicine

## 2021-12-17 DIAGNOSIS — B079 Viral wart, unspecified: Secondary | ICD-10-CM | POA: Diagnosis not present

## 2021-12-17 DIAGNOSIS — C44622 Squamous cell carcinoma of skin of right upper limb, including shoulder: Secondary | ICD-10-CM | POA: Diagnosis not present

## 2021-12-17 DIAGNOSIS — D485 Neoplasm of uncertain behavior of skin: Secondary | ICD-10-CM | POA: Diagnosis not present

## 2021-12-17 DIAGNOSIS — D0461 Carcinoma in situ of skin of right upper limb, including shoulder: Secondary | ICD-10-CM | POA: Diagnosis not present

## 2021-12-17 DIAGNOSIS — L570 Actinic keratosis: Secondary | ICD-10-CM | POA: Diagnosis not present

## 2021-12-19 DIAGNOSIS — E669 Obesity, unspecified: Secondary | ICD-10-CM | POA: Diagnosis not present

## 2021-12-19 DIAGNOSIS — M79676 Pain in unspecified toe(s): Secondary | ICD-10-CM | POA: Diagnosis not present

## 2021-12-19 DIAGNOSIS — M1A09X0 Idiopathic chronic gout, multiple sites, without tophus (tophi): Secondary | ICD-10-CM | POA: Diagnosis not present

## 2021-12-19 DIAGNOSIS — Z6832 Body mass index (BMI) 32.0-32.9, adult: Secondary | ICD-10-CM | POA: Diagnosis not present

## 2021-12-29 ENCOUNTER — Other Ambulatory Visit: Payer: Self-pay | Admitting: Family Medicine

## 2022-01-02 DIAGNOSIS — C44622 Squamous cell carcinoma of skin of right upper limb, including shoulder: Secondary | ICD-10-CM | POA: Diagnosis not present

## 2022-01-06 ENCOUNTER — Other Ambulatory Visit: Payer: Self-pay | Admitting: Family Medicine

## 2022-01-17 ENCOUNTER — Other Ambulatory Visit: Payer: Self-pay | Admitting: Family Medicine

## 2022-01-17 DIAGNOSIS — I10 Essential (primary) hypertension: Secondary | ICD-10-CM

## 2022-02-03 DIAGNOSIS — M79676 Pain in unspecified toe(s): Secondary | ICD-10-CM | POA: Diagnosis not present

## 2022-02-03 DIAGNOSIS — M1A09X0 Idiopathic chronic gout, multiple sites, without tophus (tophi): Secondary | ICD-10-CM | POA: Diagnosis not present

## 2022-02-03 DIAGNOSIS — E669 Obesity, unspecified: Secondary | ICD-10-CM | POA: Diagnosis not present

## 2022-02-03 DIAGNOSIS — Z6832 Body mass index (BMI) 32.0-32.9, adult: Secondary | ICD-10-CM | POA: Diagnosis not present

## 2022-02-21 ENCOUNTER — Ambulatory Visit (INDEPENDENT_AMBULATORY_CARE_PROVIDER_SITE_OTHER): Payer: PPO | Admitting: Family Medicine

## 2022-02-21 ENCOUNTER — Encounter: Payer: Self-pay | Admitting: Family Medicine

## 2022-02-21 VITALS — BP 160/86 | HR 73 | Resp 16 | Ht 70.0 in | Wt 226.0 lb

## 2022-02-21 DIAGNOSIS — M1A9XX1 Chronic gout, unspecified, with tophus (tophi): Secondary | ICD-10-CM | POA: Diagnosis not present

## 2022-02-21 DIAGNOSIS — R7989 Other specified abnormal findings of blood chemistry: Secondary | ICD-10-CM

## 2022-02-21 DIAGNOSIS — N1832 Chronic kidney disease, stage 3b: Secondary | ICD-10-CM

## 2022-02-21 DIAGNOSIS — I10 Essential (primary) hypertension: Secondary | ICD-10-CM | POA: Diagnosis not present

## 2022-02-21 DIAGNOSIS — Z125 Encounter for screening for malignant neoplasm of prostate: Secondary | ICD-10-CM | POA: Diagnosis not present

## 2022-02-21 DIAGNOSIS — E669 Obesity, unspecified: Secondary | ICD-10-CM | POA: Diagnosis not present

## 2022-02-21 DIAGNOSIS — E559 Vitamin D deficiency, unspecified: Secondary | ICD-10-CM

## 2022-02-21 DIAGNOSIS — E782 Mixed hyperlipidemia: Secondary | ICD-10-CM

## 2022-02-21 DIAGNOSIS — Z0000 Encounter for general adult medical examination without abnormal findings: Secondary | ICD-10-CM

## 2022-02-21 DIAGNOSIS — E66811 Obesity, class 1: Secondary | ICD-10-CM

## 2022-02-21 MED ORDER — METOPROLOL TARTRATE 100 MG PO TABS: 100.0000 mg | ORAL_TABLET | Freq: Two times a day (BID) | ORAL | 3 refills | Status: DC

## 2022-02-21 NOTE — Patient Instructions (Addendum)
F/U in 8 to 10 weeks, call if you need me sooner ? ? ?New higher  dose of metoprolol 100 mg twice daily, STOP metoprolol 50 mg and 25 mg tabs ? ?Lipid. Cmp and eGFr , uruic acid level, TSH,  vit D and PSa ? ?It is important that you exercise regularly at least 30 minutes 5 times a week. If you develop chest pain, have severe difficulty breathing, or feel very tired, stop exercising immediately and seek medical attention  ? ? ?Think about what you will eat, plan ahead. ?Choose " clean, green, fresh or frozen" over canned, processed or packaged foods which are more sugary, salty and fatty. ?70 to 75% of food eaten should be vegetables and fruit. ?Three meals at set times with snacks allowed between meals, but they must be fruit or vegetables. ?Aim to eat over a 12 hour period , example 7 am to 7 pm, and STOP after  your last meal of the day. ?Drink water,generally about 64 ounces per day, no other drink is as healthy. Fruit juice is best enjoyed in a healthy way, by EATING the fruit. ? ? ?Thanks for choosing [facility], we consider it a privelige to serve you. ? ?

## 2022-02-22 LAB — TSH: TSH: 4.54 u[IU]/mL — ABNORMAL HIGH (ref 0.450–4.500)

## 2022-02-22 LAB — CMP14+EGFR
ALT: 26 IU/L (ref 0–44)
AST: 31 IU/L (ref 0–40)
Albumin/Globulin Ratio: 2.5 — ABNORMAL HIGH (ref 1.2–2.2)
Albumin: 4.8 g/dL — ABNORMAL HIGH (ref 3.7–4.7)
Alkaline Phosphatase: 54 IU/L (ref 44–121)
BUN/Creatinine Ratio: 10 (ref 10–24)
BUN: 17 mg/dL (ref 8–27)
Bilirubin Total: 0.4 mg/dL (ref 0.0–1.2)
CO2: 21 mmol/L (ref 20–29)
Calcium: 10 mg/dL (ref 8.6–10.2)
Chloride: 106 mmol/L (ref 96–106)
Creatinine, Ser: 1.65 mg/dL — ABNORMAL HIGH (ref 0.76–1.27)
Globulin, Total: 1.9 g/dL (ref 1.5–4.5)
Glucose: 93 mg/dL (ref 70–99)
Potassium: 4.8 mmol/L (ref 3.5–5.2)
Sodium: 144 mmol/L (ref 134–144)
Total Protein: 6.7 g/dL (ref 6.0–8.5)
eGFR: 42 mL/min/{1.73_m2} — ABNORMAL LOW (ref >59)

## 2022-02-22 LAB — VITAMIN D 25 HYDROXY (VIT D DEFICIENCY, FRACTURES): Vit D, 25-Hydroxy: 27.9 ng/mL — ABNORMAL LOW (ref 30.0–100.0)

## 2022-02-22 LAB — LIPID PANEL
Chol/HDL Ratio: 3 ratio (ref 0.0–5.0)
Cholesterol, Total: 164 mg/dL (ref 100–199)
HDL: 54 mg/dL (ref >39)
LDL Chol Calc (NIH): 84 mg/dL (ref 0–99)
Triglycerides: 154 mg/dL — ABNORMAL HIGH (ref 0–149)
VLDL Cholesterol Cal: 26 mg/dL (ref 5–40)

## 2022-02-22 LAB — URIC ACID: Uric Acid: 7.2 mg/dL (ref 3.8–8.4)

## 2022-02-22 LAB — PSA: Prostate Specific Ag, Serum: 0.6 ng/mL (ref 0.0–4.0)

## 2022-02-24 ENCOUNTER — Encounter: Payer: Self-pay | Admitting: Family Medicine

## 2022-02-24 NOTE — Progress Notes (Signed)
? ?ARSHAWN [name]     [name] [name]: [id number]      DOB: 1941-06-12 ? ? ?HPI ?[name] is here for follow up and re-evaluation of chronic medical conditions, medication management and review of any available recent lab and radiology data.  ?Preventive health is updated, specifically  Cancer screening and Immunization.   ?Questions or concerns regarding consultations or procedures which the PT has had in the interim are  addressed.has had recurrent severe episodes of gouit and ankle injury, making him immobile essentially for approx 2 months earlier this t year ?The PT denies any adverse reactions to current medications since the last visit.  ? ?There are no specific complaints  ? ?ROS ?Denies recent fever or chills. ?Denies sinus pressure, nasal congestion, ear pain or sore throat. ?Denies chest congestion, productive cough or wheezing. ?Denies chest pains, palpitations and leg swelling ?Denies abdominal pain, nausea, vomiting,diarrhea or constipation.   ?Denies dysuria, frequency, hesitancy or incontinenDenies joint pain, swelling and limitation in mobility. ?Denies headaches, seizures, numbness, or tingling. ?Denies depression, anxiety or insomnia. ?Denies skin break down or rash. ? ? ?PE ? ?BP (!) 160/86   Pulse 73   Resp 16   Ht '5\' 10"'$  (1.778 m)   Wt 226 lb (102.5 kg)   SpO2 94%   BMI 32.43 kg/m?  ? ?Patient alert and oriented and in no cardiopulmonary distress. ? ?HEENT: No facial asymmetry, EOMI,     Neck supple . ? ?Chest: Clear to auscultation bilaterally. ? ?CVS: S1, S2 no murmurs, no S3.Regular rate. ? ?ABD: Soft non tender.  ? ?Ext: No edema ? ?MS: Adequate though reduced  ROM spine, shoulders, hips and knees. ? ?Skin: Intact, no ulcerations or rash noted. ? ?Psych: Good eye contact, normal affect. Memory intact not anxious or depressed appearing. ? ?CNS: CN 2-12 intact, power,  normal throughout.no focal deficits noted. ? ? ?Assessment & Plan ? ?Essential hypertension ?Uncontrolled, inc metoprolol and  re assess ?DASH diet and commitment to daily physical activity for a minimum of 30 minutes discussed and encouraged, as a part of hypertension management. ?The importance of attaining a healthy weight is also discussed. ? ? ?  02/21/2022  ?  8:30 AM 02/21/2022  ?  8:07 AM 10/24/2021  ?  8:45 AM 10/24/2021  ?  8:09 AM 10/24/2021  ?  8:04 AM 06/25/2021  ?  8:24 AM 06/25/2021  ?  8:09 AM  ?BP/Weight  ?Systolic BP 166 063 016 010 165 148 136  ?Diastolic BP 86 96 80 80 95 92 92  ?Wt. (Lbs)  226   223.12 220 200.04  ?BMI  32.43 kg/m2   31.94 kg/m2 31.57 kg/m2 28.7 kg/m2  ? ? ? ? ? ?Hyperlipidemia ?Improved, no med change ?Hyperlipidemia:Low fat diet discussed and encouraged. ? ? ?Lipid Panel  ?Lab Results  ?Component Value Date  ? CHOL 164 02/21/2022  ? HDL 54 02/21/2022  ? [place] 84 02/21/2022  ? TRIG 154 (H) 02/21/2022  ? CHOLHDL 3.0 02/21/2022  ? ? ? ? ? ?Gout ?uric acid remains above goal, however needs to renal dosing of allopurinol an colchicine, focus on  Food choice, being managed by rheumatology ? ?Elevated TSH ?Repeated and near normal ? ? ?Obesity (BMI 30.0-34.9) ? ?Patient re-educated about  the importance of commitment to a  minimum of 150 minutes of exercise per week as able. ? ?The importance of healthy food choices with portion control discussed, as well as eating regularly and within a 12 hour window  most days. ?The need to choose "clean , green" food 50 to 75% of the time is discussed, as well as to make water the primary drink and set a goal of 64 ounces water daily. ? ?  ? ?  02/21/2022  ?  8:07 AM 10/24/2021  ?  8:04 AM 06/25/2021  ?  8:24 AM  ?Weight /BMI  ?Weight 226 lb 223 lb 1.9 oz 220 lb  ?Height '5\' 10"'$  (1.778 m) 5' 10.08" (1.78 m)   ?BMI 32.43 kg/m2 31.94 kg/m2 31.57 kg/m2  ? ? ? ? ?Stage 3b chronic kidney disease (CKD) ([place]) ?Stable, continue renal diet and improve blood pressure ? ?

## 2022-02-24 NOTE — Assessment & Plan Note (Signed)
Stable, continue renal diet and improve blood pressure ?

## 2022-02-24 NOTE — Assessment & Plan Note (Signed)
Repeated and near normal ? ?

## 2022-02-24 NOTE — Assessment & Plan Note (Signed)
?  Patient re-educated about  the importance of commitment to a  minimum of 150 minutes of exercise per week as able. ? ?The importance of healthy food choices with portion control discussed, as well as eating regularly and within a 12 hour window most days. ?The need to choose "clean , green" food 50 to 75% of the time is discussed, as well as to make water the primary drink and set a goal of 64 ounces water daily. ? ?  ? ?  02/21/2022  ?  8:07 AM 10/24/2021  ?  8:04 AM 06/25/2021  ?  8:24 AM  ?Weight /BMI  ?Weight 226 lb 223 lb 1.9 oz 220 lb  ?Height '5\' 10"'$  (1.778 m) 5' 10.08" (1.78 m)   ?BMI 32.43 kg/m2 31.94 kg/m2 31.57 kg/m2  ? ? ? ?

## 2022-02-24 NOTE — Assessment & Plan Note (Signed)
Improved, no med change ?Hyperlipidemia:Low fat diet discussed and encouraged. ? ? ?Lipid Panel  ?Lab Results  ?Component Value Date  ? CHOL 164 02/21/2022  ? HDL 54 02/21/2022  ? [place] 84 02/21/2022  ? TRIG 154 (H) 02/21/2022  ? CHOLHDL 3.0 02/21/2022  ? ? ? ? ?

## 2022-02-24 NOTE — Assessment & Plan Note (Signed)
uric acid remains above goal, however needs to renal dosing of allopurinol an colchicine, focus on  Food choice, being managed by rheumatology ?

## 2022-02-24 NOTE — Assessment & Plan Note (Signed)
Uncontrolled, inc metoprolol and re assess ?DASH diet and commitment to daily physical activity for a minimum of 30 minutes discussed and encouraged, as a part of hypertension management. ?The importance of attaining a healthy weight is also discussed. ? ? ?  02/21/2022  ?  8:30 AM 02/21/2022  ?  8:07 AM 10/24/2021  ?  8:45 AM 10/24/2021  ?  8:09 AM 10/24/2021  ?  8:04 AM 06/25/2021  ?  8:24 AM 06/25/2021  ?  8:09 AM  ?BP/Weight  ?Systolic BP 436 067 703 403 165 148 136  ?Diastolic BP 86 96 80 80 95 92 92  ?Wt. (Lbs)  226   223.12 220 200.04  ?BMI  32.43 kg/m2   31.94 kg/m2 31.57 kg/m2 28.7 kg/m2  ? ? ? ? ?

## 2022-03-02 ENCOUNTER — Other Ambulatory Visit: Payer: Self-pay | Admitting: Family Medicine

## 2022-04-02 ENCOUNTER — Other Ambulatory Visit: Payer: Self-pay

## 2022-04-02 ENCOUNTER — Telehealth: Payer: Self-pay | Admitting: Family Medicine

## 2022-04-02 MED ORDER — FAMOTIDINE 40 MG PO TABS: 40.0000 mg | ORAL_TABLET | Freq: Every day | ORAL | 5 refills | Status: DC

## 2022-04-02 NOTE — Telephone Encounter (Signed)
Pt states they are having an issue with one his medications. It is costing them a lot to only get 21 pills at a time. Can you please speak with pt wife and see what can be done to help them? Wants a 90 day supply?   Famotidine '40mg'$      [facility]

## 2022-04-03 ENCOUNTER — Other Ambulatory Visit: Payer: Self-pay

## 2022-04-03 MED ORDER — FAMOTIDINE 40 MG PO TABS: 40.0000 mg | ORAL_TABLET | Freq: Every day | ORAL | 1 refills | Status: DC

## 2022-04-03 NOTE — Telephone Encounter (Signed)
CHANGED TO 90 DAY SUPPLY AND SENT [facility] CARD AND PT AWARE

## 2022-04-09 ENCOUNTER — Telehealth: Payer: Self-pay | Admitting: Family Medicine

## 2022-04-09 NOTE — Telephone Encounter (Signed)
Pt wife called stating that when they went to [facility] to pick up his medication it was only a 30-day supply. Pt wife states Velna Hatchet called last week & told them she talked to [facility] & it was supposed to have been for the 90-day supply. Pt wife states originally they were only getting at 21-day supply & the price was too much. She is wanting to know if there is anything else that  we can do to get this medication switched to a 90-day supply to be cheaper?   Famotidine '40mg'$ 

## 2022-04-10 ENCOUNTER — Other Ambulatory Visit: Payer: Self-pay

## 2022-04-10 MED ORDER — FAMOTIDINE 40 MG PO TABS: 40.0000 mg | ORAL_TABLET | Freq: Every day | ORAL | 1 refills | Status: DC

## 2022-04-10 NOTE — Telephone Encounter (Signed)
Resent RX with note to pharmacy requesting 90 day supply

## 2022-04-15 ENCOUNTER — Encounter: Payer: Self-pay | Admitting: Internal Medicine

## 2022-04-16 DIAGNOSIS — Z6832 Body mass index (BMI) 32.0-32.9, adult: Secondary | ICD-10-CM | POA: Diagnosis not present

## 2022-04-16 DIAGNOSIS — E669 Obesity, unspecified: Secondary | ICD-10-CM | POA: Diagnosis not present

## 2022-04-16 DIAGNOSIS — M79676 Pain in unspecified toe(s): Secondary | ICD-10-CM | POA: Diagnosis not present

## 2022-04-16 DIAGNOSIS — M1A09X0 Idiopathic chronic gout, multiple sites, without tophus (tophi): Secondary | ICD-10-CM | POA: Diagnosis not present

## 2022-04-20 IMAGING — DX DG CHEST 2V
2 series · 2 of 2 positions shown · non-contrast
Comparison: July 22, 2012.

CLINICAL DATA: Chest pain.

EXAM:
CHEST - 2 VIEW

[chest lat (1 of 2)]
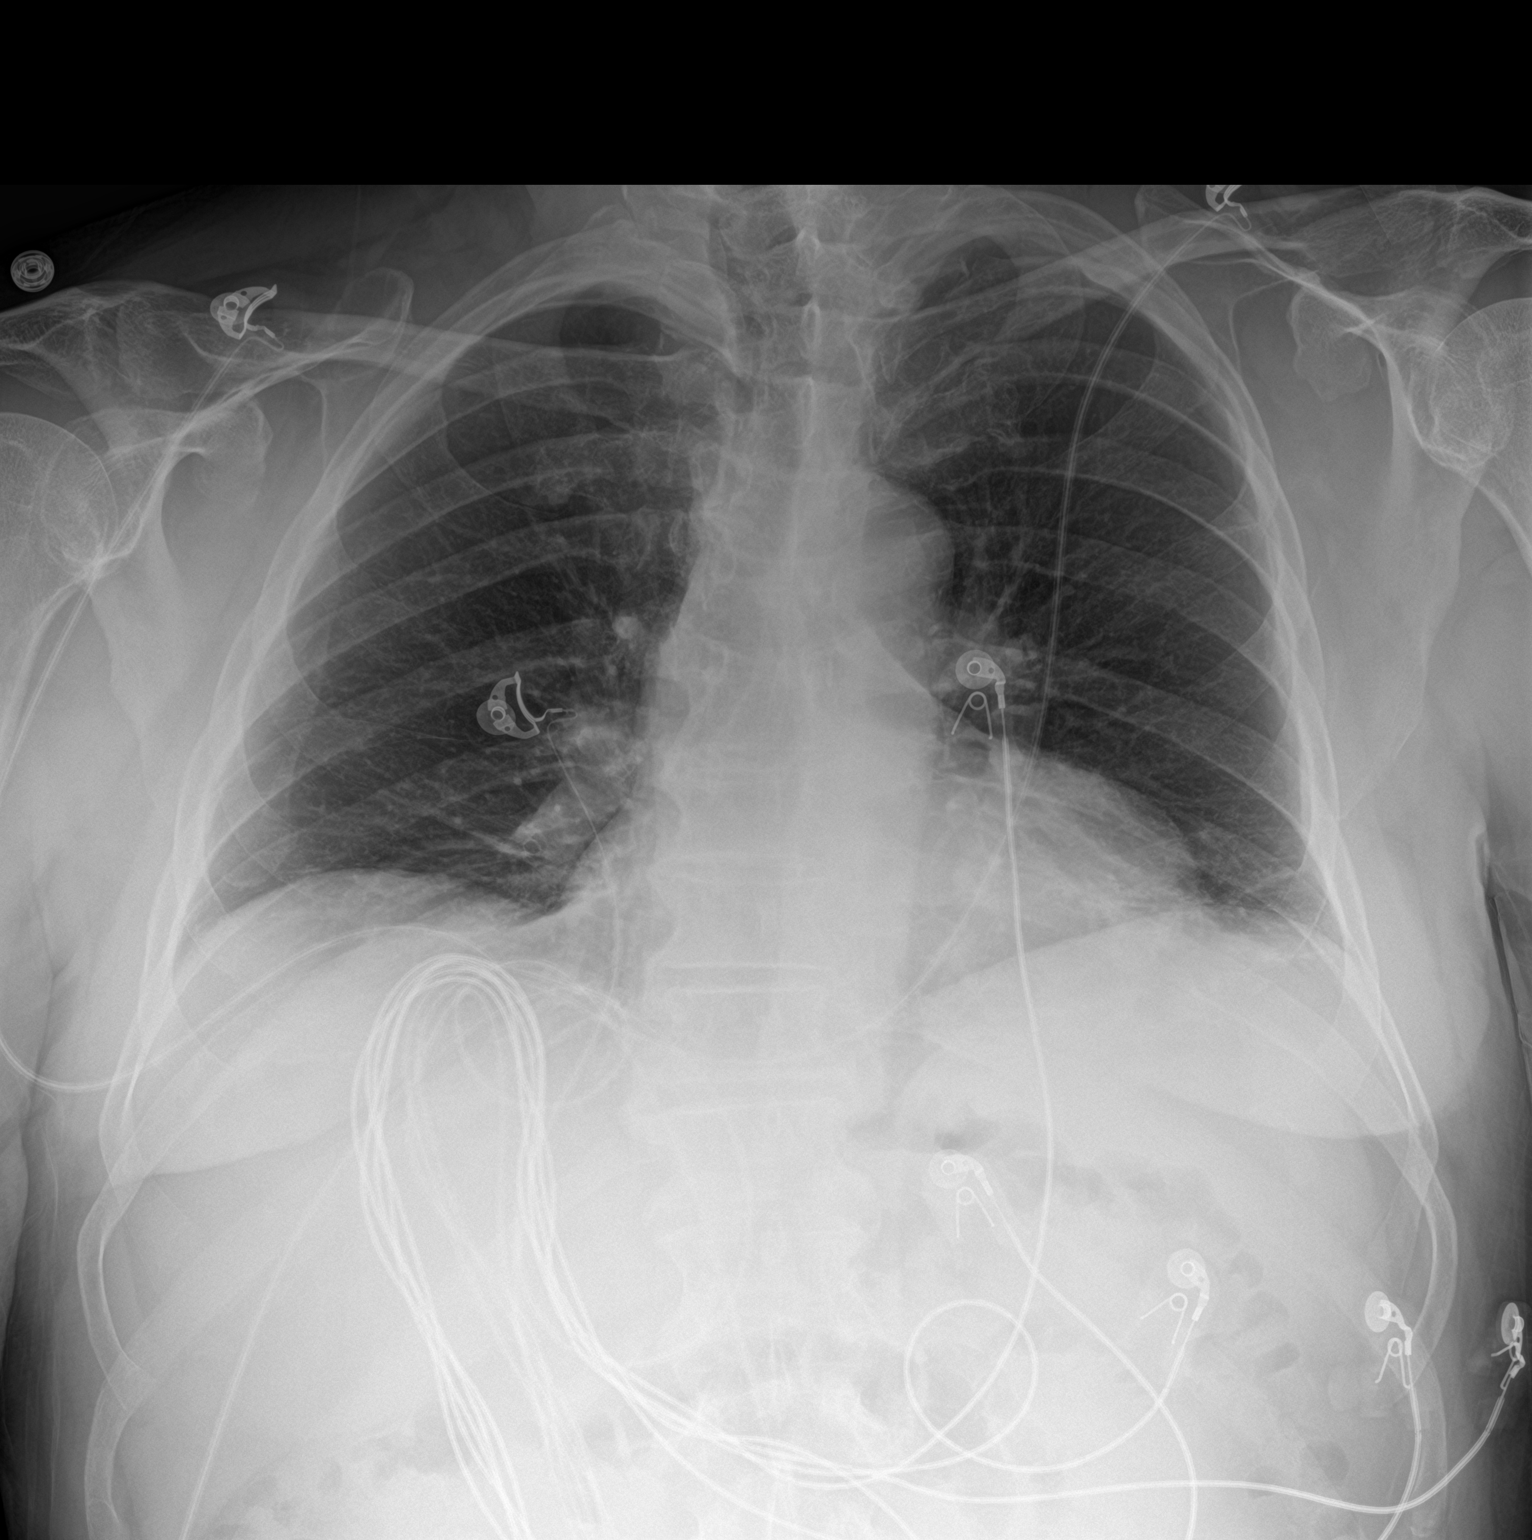

[chest lat (2 of 2)]
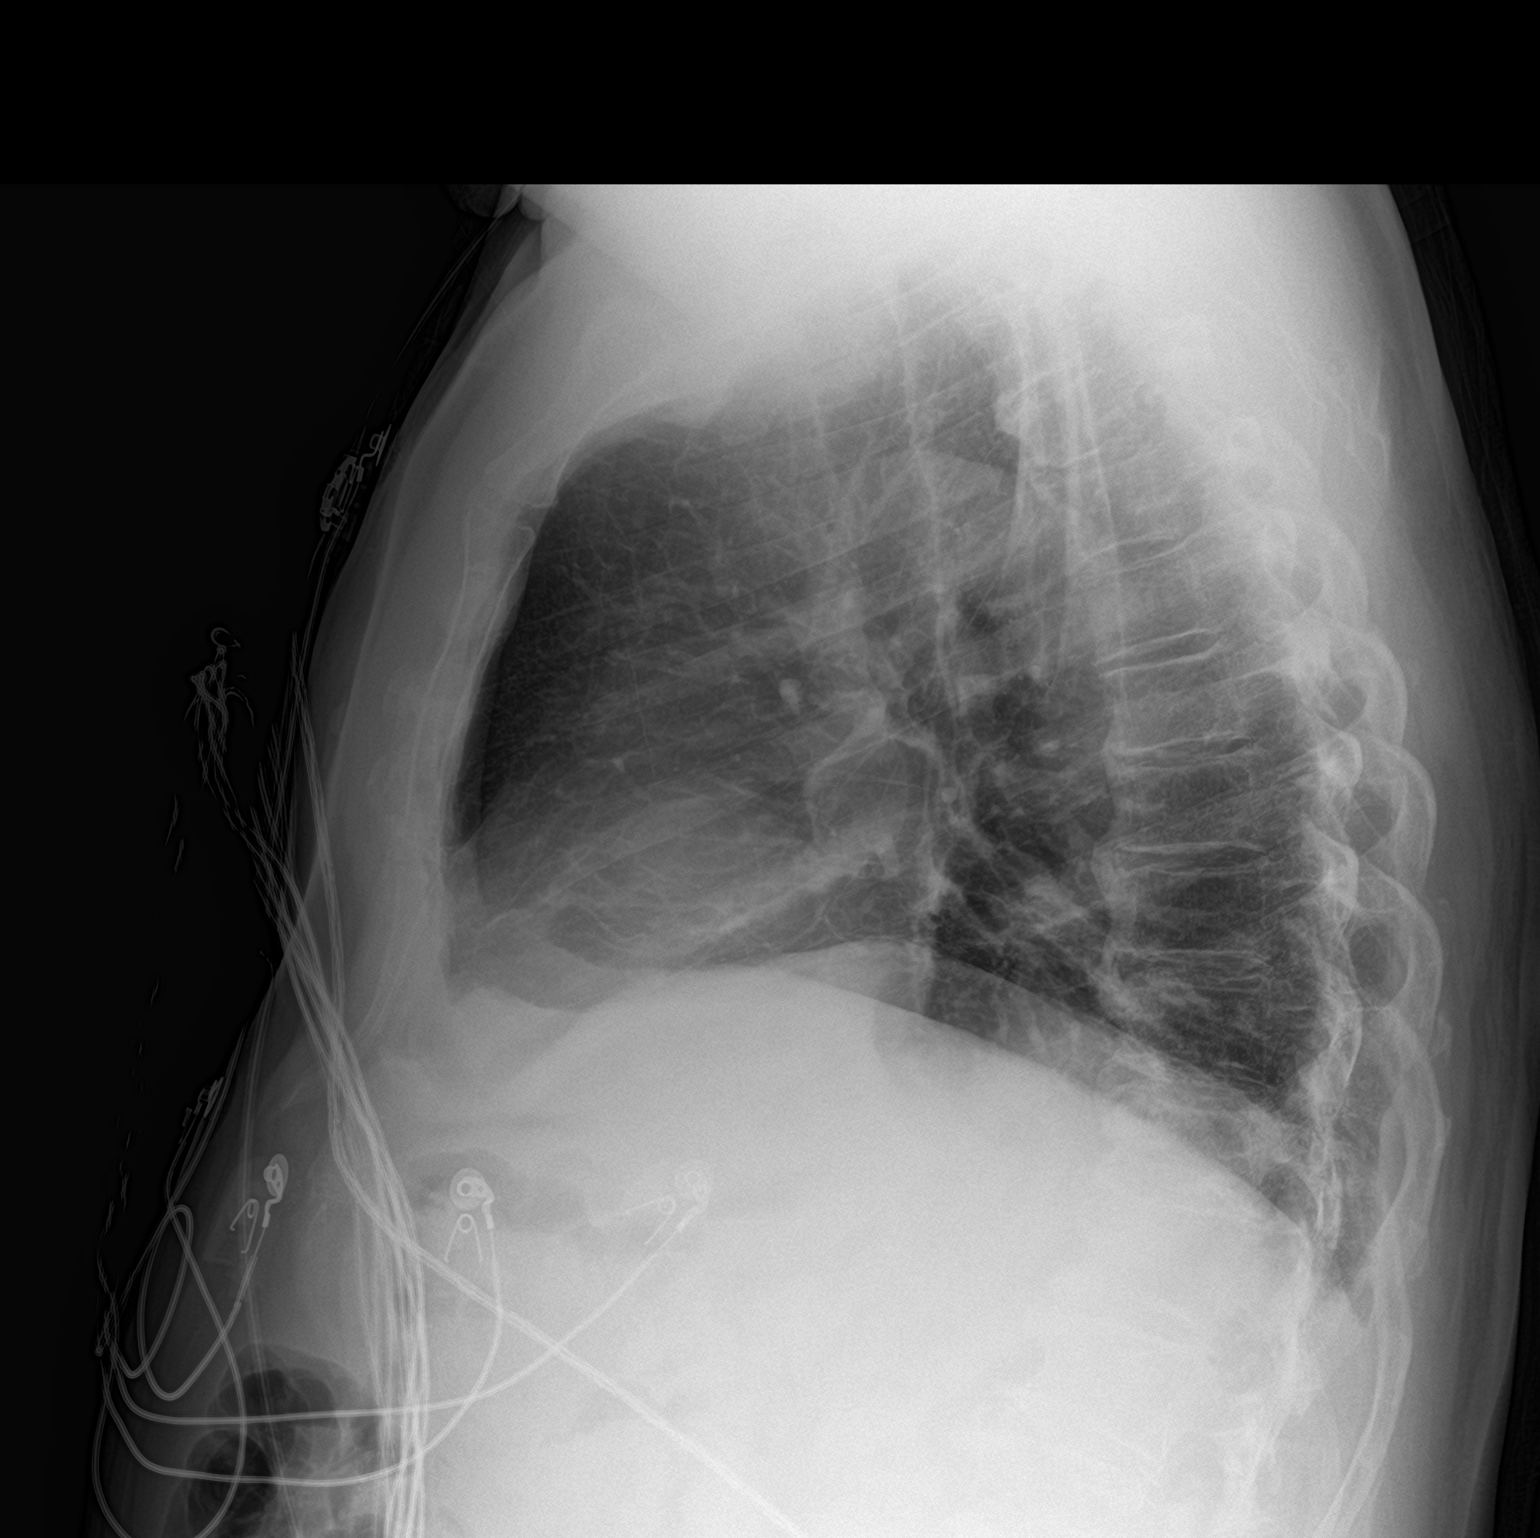

[2 of 2 positions shown; findings below may reference images not displayed]

FINDINGS: The heart size and mediastinal contours are within normal limits. No
pneumothorax or pleural effusion is noted. Minimal bibasilar
subsegmental atelectasis is noted. The visualized skeletal
structures are unremarkable.
IMPRESSION: Minimal bibasilar subsegmental atelectasis.

## 2022-04-29 ENCOUNTER — Ambulatory Visit (INDEPENDENT_AMBULATORY_CARE_PROVIDER_SITE_OTHER): Payer: PPO | Admitting: Family Medicine

## 2022-04-29 ENCOUNTER — Encounter: Payer: Self-pay | Admitting: Family Medicine

## 2022-04-29 VITALS — BP 144/82 | HR 51 | Ht 70.0 in | Wt 220.1 lb

## 2022-04-29 DIAGNOSIS — I10 Essential (primary) hypertension: Secondary | ICD-10-CM | POA: Diagnosis not present

## 2022-04-29 DIAGNOSIS — E669 Obesity, unspecified: Secondary | ICD-10-CM | POA: Diagnosis not present

## 2022-04-29 DIAGNOSIS — M1A9XX1 Chronic gout, unspecified, with tophus (tophi): Secondary | ICD-10-CM | POA: Diagnosis not present

## 2022-04-29 DIAGNOSIS — E782 Mixed hyperlipidemia: Secondary | ICD-10-CM | POA: Diagnosis not present

## 2022-04-29 MED ORDER — AMLODIPINE BESYLATE 2.5 MG PO TABS: 2.5000 mg | ORAL_TABLET | Freq: Every day | ORAL | 3 refills | Status: DC

## 2022-04-29 NOTE — Assessment & Plan Note (Signed)
  Patient re-educated about  the importance of commitment to a  minimum of 150 minutes of exercise per week as able.  The importance of healthy food choices with portion control discussed, as well as eating regularly and within a 12 hour window most days. The need to choose "clean , green" food 50 to 75% of the time is discussed, as well as to make water the primary drink and set a goal of 64 ounces water daily.       04/29/2022    8:22 AM 02/21/2022    8:07 AM 10/24/2021    8:04 AM  Weight /BMI  Weight 220 lb 1.9 oz 226 lb 223 lb 1.9 oz  Height '5\' 10"'$  (1.778 m) '5\' 10"'$  (1.778 m) 5' 10.08" (1.78 m)  BMI 31.58 kg/m2 32.43 kg/m2 31.94 kg/m2   Improved, he is applauded on this ]

## 2022-04-29 NOTE — Assessment & Plan Note (Signed)
Hyperlipidemia:Low fat diet discussed and encouraged.   Lipid Panel  Lab Results  Component Value Date   CHOL 164 02/21/2022   HDL 54 02/21/2022   LDLCALC 84 02/21/2022   TRIG 154 (H) 02/21/2022   CHOLHDL 3.0 02/21/2022     Needs to reduce fat in diet Updated lab needed at/ before next visit.

## 2022-04-29 NOTE — Assessment & Plan Note (Signed)
No recent flare managed by rheumatology, changed to allopurinol

## 2022-04-29 NOTE — Progress Notes (Signed)
[name] [name]     MRN: [id number]      DOB: 26-May-1941   HPI [name] [name] is here for follow up and re-evaluation of chronic medical conditions, medication management and review of any available recent lab and radiology data.  Preventive health is updated, specifically  Cancer screening and Immunization.   Questions or concerns regarding consultations or procedures which the PT has had in the interim are  addressed. The PT denies any adverse reactions to current medications since the last visit.  There are no new concerns.  There are no specific complaints   ROS Denies recent fever or chills. Denies sinus pressure, nasal congestion, ear pain or sore throat. Denies chest congestion, productive cough or wheezing. Denies chest pains, palpitations and leg swelling Denies abdominal pain, nausea, vomiting,diarrhea or constipation.   Denies dysuria, frequency, hesitancy or incontinence. Chronic joint pain, swelling and limitation in mobility. Denies headaches, seizures, numbness, or tingling. Denies depression, anxiety or insomnia. Denies skin break down or rash.   PE  BP (!) 144/82   Pulse (!) 51   Ht '5\' 10"'$  (1.778 m)   Wt 220 lb 1.9 oz (99.8 kg)   SpO2 94%   BMI 31.58 kg/m   Patient alert and oriented and in no cardiopulmonary distress.  HEENT: No facial asymmetry, EOMI,     Neck supple .  Chest: Clear to auscultation bilaterally.  CVS: S1, S2 no murmurs, no S3.Regular rate.  ABD: Soft non tender.   Ext: No edema Though reduced ROM spine, shoulders, hips and knees.  Skin: Intact, no ulcerations or rash noted.  Psych: Good eye contact, normal affect. Memory intact not anxious or depressed appearing.  CNS: CN 2-12 intact, power,  normal throughout.no focal deficits noted.   Assessment & Plan  Essential hypertension Uncontrolled add amlodipine 2.5 mg daily DASH diet and commitment to daily physical activity for a minimum of 30 minutes discussed and encouraged, as  a part of hypertension management. The importance of attaining a healthy weight is also discussed.     04/29/2022    8:52 AM 04/29/2022    8:22 AM 02/21/2022    8:30 AM 02/21/2022    8:07 AM 10/24/2021    8:45 AM 10/24/2021    8:09 AM 10/24/2021    8:04 AM  BP/Weight  Systolic BP 443 154 008 676 195 093 267  Diastolic BP 82 82 86 96 80 80 95  Wt. (Lbs)  220.12  226   223.12  BMI  31.58 kg/m2  32.43 kg/m2   31.94 kg/m2       Hyperlipidemia Hyperlipidemia:Low fat diet discussed and encouraged.   Lipid Panel  Lab Results  Component Value Date   CHOL 164 02/21/2022   HDL 54 02/21/2022   LDLCALC 84 02/21/2022   TRIG 154 (H) 02/21/2022   CHOLHDL 3.0 02/21/2022     Needs to reduce fat in diet Updated lab needed at/ before next visit.   Gout No recent flare managed by rheumatology, changed to allopurinol  Obesity (BMI 30.0-34.9)  Patient re-educated about  the importance of commitment to a  minimum of 150 minutes of exercise per week as able.  The importance of healthy food choices with portion control discussed, as well as eating regularly and within a 12 hour window most days. The need to choose "clean , green" food 50 to 75% of the time is discussed, as well as to make water the primary drink and set a goal of 64 ounces  water daily.       04/29/2022    8:22 AM 02/21/2022    8:07 AM 10/24/2021    8:04 AM  Weight /BMI  Weight 220 lb 1.9 oz 226 lb 223 lb 1.9 oz  Height '5\' 10"'$  (1.778 m) '5\' 10"'$  (1.778 m) 5' 10.08" (1.78 m)  BMI 31.58 kg/m2 32.43 kg/m2 31.94 kg/m2   Improved, he is applauded on this ]

## 2022-04-29 NOTE — Assessment & Plan Note (Signed)
Uncontrolled add amlodipine 2.5 mg daily DASH diet and commitment to daily physical activity for a minimum of 30 minutes discussed and encouraged, as a part of hypertension management. The importance of attaining a healthy weight is also discussed.     04/29/2022    8:52 AM 04/29/2022    8:22 AM 02/21/2022    8:30 AM 02/21/2022    8:07 AM 10/24/2021    8:45 AM 10/24/2021    8:09 AM 10/24/2021    8:04 AM  BP/Weight  Systolic BP 199 144 458 483 507 573 225  Diastolic BP 82 82 86 96 80 80 95  Wt. (Lbs)  220.12  226   223.12  BMI  31.58 kg/m2  32.43 kg/m2   31.94 kg/m2

## 2022-04-29 NOTE — Patient Instructions (Addendum)
F/u in 8 weeks, re evaluate blood pressure and flu vaccine,  New additional medication  is amlodipine 2.5 mg daily, continue other meds  Need TdAP, shingrix vaccines   Fasting lipid, cmp and EGFr , uric acid level and cBC 5 days before visit  It is important that you exercise regularly at least 30 minutes 5 times a week. If you develop chest pain, have severe difficulty breathing, or feel very tired, stop exercising immediately and seek medical attention   Think about what you will eat, plan ahead. Choose " clean, green, fresh or frozen" over canned, processed or packaged foods which are more sugary, salty and fatty. 70 to 75% of food eaten should be vegetables and fruit. Three meals at set times with snacks allowed between meals, but they must be fruit or vegetables. Aim to eat over a 12 hour period , example 7 am to 7 pm, and STOP after  your last meal of the day. Drink water,generally about 64 ounces per day, no other drink is as healthy. Fruit juice is best enjoyed in a healthy way, by EATING the fruit.   Thanks for choosing [facility], we consider it a privelige to serve you.

## 2022-05-24 ENCOUNTER — Other Ambulatory Visit: Payer: Self-pay | Admitting: Family Medicine

## 2022-06-04 DIAGNOSIS — Z79899 Other long term (current) drug therapy: Secondary | ICD-10-CM | POA: Diagnosis not present

## 2022-06-04 DIAGNOSIS — M1A09X0 Idiopathic chronic gout, multiple sites, without tophus (tophi): Secondary | ICD-10-CM | POA: Diagnosis not present

## 2022-06-17 DIAGNOSIS — M7661 Achilles tendinitis, right leg: Secondary | ICD-10-CM | POA: Diagnosis not present

## 2022-06-17 DIAGNOSIS — L570 Actinic keratosis: Secondary | ICD-10-CM | POA: Diagnosis not present

## 2022-06-18 ENCOUNTER — Other Ambulatory Visit: Payer: Self-pay | Admitting: Sports Medicine

## 2022-06-18 ENCOUNTER — Other Ambulatory Visit (HOSPITAL_COMMUNITY): Payer: Self-pay | Admitting: Sports Medicine

## 2022-06-18 DIAGNOSIS — M7730 Calcaneal spur, unspecified foot: Secondary | ICD-10-CM

## 2022-06-18 DIAGNOSIS — M6788 Other specified disorders of synovium and tendon, other site: Secondary | ICD-10-CM

## 2022-06-18 DIAGNOSIS — M7751 Other enthesopathy of right foot: Secondary | ICD-10-CM

## 2022-06-20 DIAGNOSIS — I10 Essential (primary) hypertension: Secondary | ICD-10-CM | POA: Diagnosis not present

## 2022-06-20 DIAGNOSIS — E782 Mixed hyperlipidemia: Secondary | ICD-10-CM | POA: Diagnosis not present

## 2022-06-20 DIAGNOSIS — M1A9XX1 Chronic gout, unspecified, with tophus (tophi): Secondary | ICD-10-CM | POA: Diagnosis not present

## 2022-06-21 LAB — CMP14+EGFR
ALT: 18 IU/L (ref 0–44)
AST: 26 IU/L (ref 0–40)
Albumin/Globulin Ratio: 2.3 — ABNORMAL HIGH (ref 1.2–2.2)
Albumin: 4.6 g/dL (ref 3.8–4.8)
Alkaline Phosphatase: 54 IU/L (ref 44–121)
BUN/Creatinine Ratio: 11 (ref 10–24)
BUN: 17 mg/dL (ref 8–27)
Bilirubin Total: 0.2 mg/dL (ref 0.0–1.2)
CO2: 21 mmol/L (ref 20–29)
Calcium: 9.2 mg/dL (ref 8.6–10.2)
Chloride: 108 mmol/L — ABNORMAL HIGH (ref 96–106)
Creatinine, Ser: 1.49 mg/dL — ABNORMAL HIGH (ref 0.76–1.27)
Globulin, Total: 2 g/dL (ref 1.5–4.5)
Glucose: 79 mg/dL (ref 70–99)
Potassium: 4.5 mmol/L (ref 3.5–5.2)
Sodium: 146 mmol/L — ABNORMAL HIGH (ref 134–144)
Total Protein: 6.6 g/dL (ref 6.0–8.5)
eGFR: 47 mL/min/{1.73_m2} — ABNORMAL LOW (ref >59)

## 2022-06-21 LAB — CBC
Hematocrit: 37.8 % (ref 37.5–51.0)
Hemoglobin: 12.5 g/dL — ABNORMAL LOW (ref 13.0–17.7)
MCH: 29.1 pg (ref 26.6–33.0)
MCHC: 33.1 g/dL (ref 31.5–35.7)
MCV: 88 fL (ref 79–97)
Platelets: 172 10*3/uL (ref 150–450)
RBC: 4.29 x10E6/uL (ref 4.14–5.80)
RDW: 14.3 % (ref 11.6–15.4)
WBC: 10.5 10*3/uL (ref 3.4–10.8)

## 2022-06-21 LAB — URIC ACID: Uric Acid: 5.5 mg/dL (ref 3.8–8.4)

## 2022-06-21 LAB — LIPID PANEL
Chol/HDL Ratio: 2.9 ratio (ref 0.0–5.0)
Cholesterol, Total: 154 mg/dL (ref 100–199)
HDL: 54 mg/dL (ref >39)
LDL Chol Calc (NIH): 79 mg/dL (ref 0–99)
Triglycerides: 115 mg/dL (ref 0–149)
VLDL Cholesterol Cal: 21 mg/dL (ref 5–40)

## 2022-06-23 ENCOUNTER — Other Ambulatory Visit: Payer: Self-pay | Admitting: Family Medicine

## 2022-06-23 ENCOUNTER — Other Ambulatory Visit: Payer: Self-pay | Admitting: Internal Medicine

## 2022-06-23 DIAGNOSIS — N401 Enlarged prostate with lower urinary tract symptoms: Secondary | ICD-10-CM

## 2022-06-24 ENCOUNTER — Ambulatory Visit (INDEPENDENT_AMBULATORY_CARE_PROVIDER_SITE_OTHER): Payer: PPO | Admitting: Family Medicine

## 2022-06-24 ENCOUNTER — Encounter: Payer: Self-pay | Admitting: Family Medicine

## 2022-06-24 VITALS — BP 118/70 | HR 54 | Ht 70.0 in | Wt 222.1 lb

## 2022-06-24 DIAGNOSIS — M25571 Pain in right ankle and joints of right foot: Secondary | ICD-10-CM | POA: Diagnosis not present

## 2022-06-24 DIAGNOSIS — E782 Mixed hyperlipidemia: Secondary | ICD-10-CM

## 2022-06-24 DIAGNOSIS — E669 Obesity, unspecified: Secondary | ICD-10-CM | POA: Diagnosis not present

## 2022-06-24 DIAGNOSIS — I10 Essential (primary) hypertension: Secondary | ICD-10-CM

## 2022-06-24 NOTE — Assessment & Plan Note (Signed)
ongoing pain, treated by Ortho, has MRI this week

## 2022-06-24 NOTE — Assessment & Plan Note (Signed)
Hyperlipidemia:Low fat diet discussed and encouraged.   Lipid Panel  Lab Results  Component Value Date   CHOL 154 06/20/2022   HDL 54 06/20/2022   LDLCALC 79 06/20/2022   TRIG 115 06/20/2022   CHOLHDL 2.9 06/20/2022     Controlled, no change in medication

## 2022-06-24 NOTE — Progress Notes (Signed)
[name] [name]     MRN: [id number]      DOB: 02-20-1941   HPI [name] [name] is here for follow up and re-evaluation of chronic medical conditions, medication management and review of any available recent lab and radiology data.  Preventive health is updated, specifically  Cancer screening and Immunization.   Questions or concerns regarding consultations or procedures which the PT has had in the interim are  addressed. The PT denies any adverse reactions to current medications since the last visit.  There are no new concerns.  There are no specific complaints   ROS Denies recent fever or chills. Denies sinus pressure, nasal congestion, ear pain or sore throat. Denies chest congestion, productive cough or wheezing. Denies chest pains, palpitations and leg swelling Denies abdominal pain, nausea, vomiting,diarrhea or constipation.   Denies dysuria, frequency, hesitancy or incontinence. Chronic right ankle pain and limitation in mobility. Denies headaches, seizures, numbness, or tingling. Denies depression, anxiety or insomnia. Denies skin break down or rash.   PE  BP 118/70   Pulse (!) 54   Ht '5\' 10"'$  (1.778 m)   Wt 222 lb 1.3 oz (100.7 kg)   SpO2 96%   BMI 31.87 kg/m   Patient alert and oriented and in no cardiopulmonary distress.  HEENT: No facial asymmetry, EOMI,     Neck supple .  Chest: Clear to auscultation bilaterally.  CVS: S1, S2 no murmurs, no S3.Regular rate.  ABD: Soft non tender.   Ext: No edema  MS: Adequate ROM spine, shoulders, hips and knees. Decreaased in right ankle which is in a boot  Skin: Intact, no ulcerations or rash noted.  Psych: Good eye contact, normal affect. Memory intact not anxious or depressed appearing.  CNS: CN 2-12 intact, power,  normal throughout.no focal deficits noted.   Assessment & Plan Essential hypertension Controlled, no change in medication DASH diet and commitment to daily physical activity for a minimum of 30  minutes discussed and encouraged, as a part of hypertension management. The importance of attaining a healthy weight is also discussed.     06/24/2022    9:56 AM 06/24/2022    9:26 AM 04/29/2022    8:52 AM 04/29/2022    8:22 AM 02/21/2022    8:30 AM 02/21/2022    8:07 AM 10/24/2021    8:45 AM  BP/Weight  Systolic BP 518 841 660 630 160 109 323  Diastolic BP 70 67 82 82 86 96 80  Wt. (Lbs)  222.08  220.12  226   BMI  31.87 kg/m2  31.58 kg/m2  32.43 kg/m2        Hyperlipidemia Hyperlipidemia:Low fat diet discussed and encouraged.   Lipid Panel  Lab Results  Component Value Date   CHOL 154 06/20/2022   HDL 54 06/20/2022   LDLCALC 79 06/20/2022   TRIG 115 06/20/2022   CHOLHDL 2.9 06/20/2022     Controlled, no change in medication   Obesity (BMI 30.0-34.9)  Patient re-educated about  the importance of commitment to a  minimum of 150 minutes of exercise per week as able.  The importance of healthy food choices with portion control discussed, as well as eating regularly and within a 12 hour window most days. The need to choose "clean , green" food 50 to 75% of the time is discussed, as well as to make water the primary drink and set a goal of 64 ounces water daily.       06/24/2022    9:26  AM 04/29/2022    8:22 AM 02/21/2022    8:07 AM  Weight /BMI  Weight 222 lb 1.3 oz 220 lb 1.9 oz 226 lb  Height '5\' 10"'$  (1.778 m) '5\' 10"'$  (1.778 m) '5\' 10"'$  (1.778 m)  BMI 31.87 kg/m2 31.58 kg/m2 32.43 kg/m2      Right ankle pain ongoing pain, treated by Ortho, has MRI this week

## 2022-06-24 NOTE — Assessment & Plan Note (Signed)
  Patient re-educated about  the importance of commitment to a  minimum of 150 minutes of exercise per week as able.  The importance of healthy food choices with portion control discussed, as well as eating regularly and within a 12 hour window most days. The need to choose "clean , green" food 50 to 75% of the time is discussed, as well as to make water the primary drink and set a goal of 64 ounces water daily.       06/24/2022    9:26 AM 04/29/2022    8:22 AM 02/21/2022    8:07 AM  Weight /BMI  Weight 222 lb 1.3 oz 220 lb 1.9 oz 226 lb  Height '5\' 10"'$  (1.778 m) '5\' 10"'$  (1.778 m) '5\' 10"'$  (1.778 m)  BMI 31.87 kg/m2 31.58 kg/m2 32.43 kg/m2

## 2022-06-24 NOTE — Patient Instructions (Signed)
Annual exam in office with mD in feb call if you need me sooner  Excellent blood pressure and labs  We will contact you for flu vaccine  Please get Covid booster, TdAP and shingrix vaccines at your pharmacy  Keep up the [place] work!!   Thanks for choosing [facility], we consider it a privelige to serve you.

## 2022-06-24 NOTE — Assessment & Plan Note (Signed)
Controlled, no change in medication DASH diet and commitment to daily physical activity for a minimum of 30 minutes discussed and encouraged, as a part of hypertension management. The importance of attaining a healthy weight is also discussed.     06/24/2022    9:56 AM 06/24/2022    9:26 AM 04/29/2022    8:52 AM 04/29/2022    8:22 AM 02/21/2022    8:30 AM 02/21/2022    8:07 AM 10/24/2021    8:45 AM  BP/Weight  Systolic BP 349 179 150 569 794 801 655  Diastolic BP 70 67 82 82 86 96 80  Wt. (Lbs)  222.08  220.12  226   BMI  31.87 kg/m2  31.58 kg/m2  32.43 kg/m2

## 2022-06-27 DIAGNOSIS — M25571 Pain in right ankle and joints of right foot: Secondary | ICD-10-CM | POA: Diagnosis not present

## 2022-07-01 DIAGNOSIS — M7661 Achilles tendinitis, right leg: Secondary | ICD-10-CM | POA: Diagnosis not present

## 2022-07-02 ENCOUNTER — Ambulatory Visit: Payer: PPO

## 2022-07-03 ENCOUNTER — Ambulatory Visit: Payer: PPO

## 2022-07-04 ENCOUNTER — Other Ambulatory Visit (HOSPITAL_COMMUNITY): Payer: PPO

## 2022-07-04 ENCOUNTER — Encounter (HOSPITAL_COMMUNITY): Payer: Self-pay

## 2022-07-09 ENCOUNTER — Ambulatory Visit (INDEPENDENT_AMBULATORY_CARE_PROVIDER_SITE_OTHER): Payer: PPO

## 2022-07-09 DIAGNOSIS — Z23 Encounter for immunization: Secondary | ICD-10-CM

## 2022-07-15 DIAGNOSIS — M79676 Pain in unspecified toe(s): Secondary | ICD-10-CM | POA: Diagnosis not present

## 2022-07-15 DIAGNOSIS — Z6832 Body mass index (BMI) 32.0-32.9, adult: Secondary | ICD-10-CM | POA: Diagnosis not present

## 2022-07-15 DIAGNOSIS — E669 Obesity, unspecified: Secondary | ICD-10-CM | POA: Diagnosis not present

## 2022-07-15 DIAGNOSIS — M1A09X0 Idiopathic chronic gout, multiple sites, without tophus (tophi): Secondary | ICD-10-CM | POA: Diagnosis not present

## 2022-07-23 DIAGNOSIS — M62830 Muscle spasm of back: Secondary | ICD-10-CM | POA: Diagnosis not present

## 2022-07-23 DIAGNOSIS — M9903 Segmental and somatic dysfunction of lumbar region: Secondary | ICD-10-CM | POA: Diagnosis not present

## 2022-07-23 DIAGNOSIS — M9902 Segmental and somatic dysfunction of thoracic region: Secondary | ICD-10-CM | POA: Diagnosis not present

## 2022-07-23 DIAGNOSIS — M9905 Segmental and somatic dysfunction of pelvic region: Secondary | ICD-10-CM | POA: Diagnosis not present

## 2022-07-30 DIAGNOSIS — M9902 Segmental and somatic dysfunction of thoracic region: Secondary | ICD-10-CM | POA: Diagnosis not present

## 2022-07-30 DIAGNOSIS — M62830 Muscle spasm of back: Secondary | ICD-10-CM | POA: Diagnosis not present

## 2022-07-30 DIAGNOSIS — M9905 Segmental and somatic dysfunction of pelvic region: Secondary | ICD-10-CM | POA: Diagnosis not present

## 2022-07-30 DIAGNOSIS — M9903 Segmental and somatic dysfunction of lumbar region: Secondary | ICD-10-CM | POA: Diagnosis not present

## 2022-08-01 ENCOUNTER — Ambulatory Visit (INDEPENDENT_AMBULATORY_CARE_PROVIDER_SITE_OTHER): Payer: PPO | Admitting: Family Medicine

## 2022-08-01 ENCOUNTER — Encounter: Payer: Self-pay | Admitting: Family Medicine

## 2022-08-01 ENCOUNTER — Ambulatory Visit (HOSPITAL_COMMUNITY)
Admission: RE | Admit: 2022-08-01 | Discharge: 2022-08-01 | Disposition: A | Discharge status: Home / Self Care | Payer: PPO | Source: Ambulatory Visit | Attending: Family Medicine | Admitting: Family Medicine

## 2022-08-01 VITALS — Temp 99.1°F

## 2022-08-01 DIAGNOSIS — R0602 Shortness of breath: Secondary | ICD-10-CM | POA: Diagnosis not present

## 2022-08-01 DIAGNOSIS — R079 Chest pain, unspecified: Secondary | ICD-10-CM | POA: Diagnosis not present

## 2022-08-01 DIAGNOSIS — R059 Cough, unspecified: Secondary | ICD-10-CM | POA: Diagnosis not present

## 2022-08-01 DIAGNOSIS — R051 Acute cough: Secondary | ICD-10-CM

## 2022-08-01 LAB — POCT INFLUENZA A/B
Influenza A, POC: NEGATIVE
Influenza B, POC: NEGATIVE

## 2022-08-01 MED ORDER — PROMETHAZINE-DM 6.25-15 MG/5ML PO SYRP: ORAL_SOLUTION | ORAL | 0 refills | Status: DC

## 2022-08-01 MED ORDER — ALBUTEROL SULFATE HFA 108 (90 BASE) MCG/ACT IN AERS: 2.0000 | INHALATION_SPRAY | Freq: Four times a day (QID) | RESPIRATORY_TRACT | 0 refills | Status: DC | PRN

## 2022-08-01 MED ORDER — METHYLPREDNISOLONE ACETATE 80 MG/ML IJ SUSP
80.0000 mg | Freq: Once | INTRAMUSCULAR | Status: AC
  Administered 2022-08-01: 80 mg via INTRAMUSCULAR

## 2022-08-01 MED ORDER — PREDNISONE 20 MG PO TABS: 20.0000 mg | ORAL_TABLET | Freq: Every day | ORAL | 0 refills | Status: DC

## 2022-08-01 NOTE — Patient Instructions (Addendum)
Annual exam in feb, call if you need me sooner  Come to the office this afternoon for Depo-Medrol 80 mg IM as well as .  Temperature and oxygen level to be checked and documented  Short course of prednisone by mouth as well as cough suppressant syrup Phenergan DM and an albuterol inhaler are prescribed.  Please get a CXR today  Use Tylenol for chills and body aches.  Ensure you stay adequately hydrated.  If you begin to feel worse as a developing fever increased chills and body aches or weakness that you need to go for additional assessment at the emergency room.  Swabs will be sent today for COVID and influenza.

## 2022-08-01 NOTE — Progress Notes (Unsigned)
Virtual Visit via Telephone Note  I connected with [name] [name] on 08/01/22 at  2:20 PM EDT by telephone and verified that I am speaking with the correct person using two identifiers.  Location: Patient: home Provider: office   I discussed the limitations, risks, security and privacy concerns of performing an evaluation and management service by telephone and the availability of in person appointments. I also discussed with the patient that there may be a patient responsible charge related to this service. The patient expressed understanding and agreed to proceed.   History of Present Illness: Cough and runny nose, x 3 days, had covid vacine the day beforeTemp 97.9, feels sore , little clear drainageintermittnct   Observations/Objective:   Assessment and Plan:   Follow Up Instructions:    I discussed the assessment and treatment plan with the patient. The patient was provided an opportunity to ask questions and all were answered. The patient agreed with the plan and demonstrated an understanding of the instructions.   The patient was advised to call back or seek an in-person evaluation if the symptoms worsen or if the condition fails to improve as anticipated.  I provided *** minutes of non-face-to-face time during this encounter.   Tula Nakayama, MD

## 2022-08-03 ENCOUNTER — Encounter: Payer: Self-pay | Admitting: Family Medicine

## 2022-08-03 DIAGNOSIS — R051 Acute cough: Secondary | ICD-10-CM | POA: Insufficient documentation

## 2022-08-03 LAB — NOVEL CORONAVIRUS, NAA: SARS-CoV-2, NAA: NOT DETECTED

## 2022-08-03 NOTE — Assessment & Plan Note (Signed)
covid and flu swabs, temp and Oxygen level check also cXR Depo medrol 80 mg IM , pt to come fore this, prednisone, phenergan DM are prescribed short trm and albuteol MDI

## 2022-08-18 ENCOUNTER — Other Ambulatory Visit: Payer: Self-pay | Admitting: Family Medicine

## 2022-08-20 DIAGNOSIS — M1A09X0 Idiopathic chronic gout, multiple sites, without tophus (tophi): Secondary | ICD-10-CM | POA: Diagnosis not present

## 2022-08-20 DIAGNOSIS — Z79899 Other long term (current) drug therapy: Secondary | ICD-10-CM | POA: Diagnosis not present

## 2022-09-02 ENCOUNTER — Other Ambulatory Visit: Payer: Self-pay | Admitting: Family Medicine

## 2022-09-17 ENCOUNTER — Other Ambulatory Visit: Payer: Self-pay | Admitting: Family Medicine

## 2022-09-17 DIAGNOSIS — N401 Enlarged prostate with lower urinary tract symptoms: Secondary | ICD-10-CM

## 2022-09-23 ENCOUNTER — Other Ambulatory Visit: Payer: Self-pay | Admitting: Family Medicine

## 2022-09-30 ENCOUNTER — Other Ambulatory Visit: Payer: Self-pay | Admitting: Family Medicine

## 2022-10-01 ENCOUNTER — Encounter: Payer: PPO | Admitting: Family Medicine

## 2022-10-06 ENCOUNTER — Ambulatory Visit (INDEPENDENT_AMBULATORY_CARE_PROVIDER_SITE_OTHER): Payer: PPO

## 2022-10-06 DIAGNOSIS — Z0000 Encounter for general adult medical examination without abnormal findings: Secondary | ICD-10-CM

## 2022-10-06 NOTE — Progress Notes (Signed)
Subjective:   [name] [name] is a 81 y.o. male who presents for Medicare Annual/Subsequent preventive examination.  Review of Systems    I connected with  [name] [name] on 10/06/22 by a audio enabled telemedicine application and verified that I am speaking with the correct person using two identifiers.  Patient Location: Home  Provider Location: Office/Clinic  I discussed the limitations of evaluation and management by telemedicine. The patient expressed understanding and agreed to proceed.        Objective:    There were no vitals filed for this visit. There is no height or weight on file to calculate BMI.     09/26/2020    8:16 AM 06/15/2020    7:52 AM 05/06/2020    1:16 PM 09/13/2018    9:56 AM 06/24/2017    3:57 PM 12/08/2016    7:03 AM 12/05/2016    3:10 PM  Advanced Directives  Does Patient Have a Medical Advance Directive? Yes Yes No No Yes Yes Yes  Type of Paramedic of [place];Living will [place];Living will   [place];Living will Living will Living will  Does patient want to make changes to medical advance directive?      No - Patient declined No - Patient declined  Copy of [place] in Chart? No - copy requested No - copy requested   No - copy requested No - copy requested No - copy requested  Would patient like information on creating a medical advance directive?    Yes (ED - Information included in AVS)  No - Patient declined No - Patient declined    Current Medications (verified) Outpatient Encounter Medications as of 10/06/2022  Medication Sig   acetaminophen (TYLENOL) 325 MG tablet Take 650 mg by mouth as needed for mild pain.    albuterol (VENTOLIN HFA) 108 (90 Base) MCG/ACT inhaler Inhale 2 puffs into the lungs every 6 (six) hours as needed for wheezing or shortness of breath.   allopurinol (ZYLOPRIM) 100 MG tablet Take 0.5 tablets (50 mg total) by mouth daily.    amLODipine (NORVASC) 2.5 MG tablet Take 1 tablet by mouth once daily   aspirin (ASPIRIN LOW DOSE) 81 MG EC tablet Take 1 tablet (81 mg total) by mouth every morning.   famotidine (PEPCID) 40 MG tablet Take 1 tablet (40 mg total) by mouth daily.   finasteride (PROSCAR) 5 MG tablet Take 1 tablet by mouth once daily   Krill Oil 300 MG CAPS Take 300 mg by mouth at bedtime.    loratadine (CLARITIN) 10 MG tablet Take 10 mg by mouth daily.   metoprolol tartrate (LOPRESSOR) 100 MG tablet Take 1 tablet (100 mg total) by mouth 2 (two) times daily.   Misc Natural Products (BLACK CHERRY CONCENTRATE PO) Take 1 Dose by mouth daily.    Multiple Vitamin (MULTIVITAMIN WITH MINERALS) TABS Take 1 tablet by mouth daily.   nitroGLYCERIN (NITROSTAT) 0.4 MG SL tablet Take 1 tab under your tongue while sitting If no relief of chest pain may repeat one tab every 5 minutes up to 3 tablets total over 15 minutes (Patient taking differently: Place 0.4 mg under the tongue every 5 (five) minutes as needed for chest pain. up to 3 tablets total over 15 minutes)   potassium chloride (KLOR-CON M) 10 MEQ tablet Take 1 tablet by mouth once daily   predniSONE (DELTASONE) 20 MG tablet Take 1 tablet (20 mg total) by mouth  daily with breakfast.   promethazine-dextromethorphan (PROMETHAZINE-DM) 6.25-15 MG/5ML syrup Takle one teaspoon by mouth two times daily as needed, for excessive cough   rosuvastatin (CRESTOR) 20 MG tablet Take 1 tablet by mouth once daily   terazosin (HYTRIN) 2 MG capsule Take 1 capsule by mouth at bedtime   No facility-administered encounter medications on file as of 10/06/2022.    Allergies (verified) Ace inhibitors   History: Past Medical History:  Diagnosis Date   Anemia, iron deficiency    Annual physical exam 09/01/2016   Cancer ([place])    skin cancers    Educated about COVID-19 virus infection 03/13/2019   ELECTROCARDIOGRAM, ABNORMAL 08/18/2010   Qualifier: Diagnosis of  By: Moshe Cipro MD, Margaret      GERD (gastroesophageal reflux disease)    H/O hiatal hernia    Headache    Hemorrhoids    and minimal polp   History of kidney stones    History of migraines childhood    Flared up in 2009, and have responded to depakote which was stopped in 9 months   Hx of colonic polyps 02/24/2007   Hyperlipidemia 2003   Hypertension 2003   Low sodium levels 02/20/2017   LUNG NODULE 08/18/2010   Qualifier: Diagnosis of  By: Moshe Cipro MD, Margaret  No need for radiologic f/u unless new symptoms develop    Lung nodules since 2009   CT scan advised in 08/2010   Neoplasm    dermal   Nonspecific abnormal findings on radiological and other examination of skull and head 08/18/2010   Qualifier: Diagnosis of  By: Moshe Cipro MD, Margaret  Scan of 08/2010 showed no change over a 2 yr period, no need for further scanning.Pt is a non smoker, will not rept unless symptomatic    Overweight(278.02)    Past Surgical History:  Procedure Laterality Date   COLONOSCOPY  2008   Fields-mod IH, 61m polyp sigmoid colon (disappeared w/ insufflation & could not be retrieved)   COLONOSCOPY  05/19/2012   Procedure: COLONOSCOPY;  Surgeon: SDanie Binder MD;  Location: [facility];  Service: Endoscopy;  Laterality: N/A;  8:30   CYSTOSCOPY W/ URETERAL STENT PLACEMENT Left 11/27/2016   Procedure: CYSTOSCOPY WITH RETROGRADE PYELOGRAM/URETERAL STENT PLACEMENT;  Surgeon: SBjorn Loser MD;  Location: [facility];  Service: Urology;  Laterality: Left;   ESOPHAGOGASTRODUODENOSCOPY  01/05/2007   Fields-sm HH, benign SB biopsy   EXTRACORPOREAL SHOCK WAVE LITHOTRIPSY Left 12/08/2016   Procedure: LEFT EXTRACORPOREAL SHOCK WAVE LITHOTRIPSY (ESWL);  Surgeon: MFestus Aloe MD;  Location: [facility];  Service: Urology;  Laterality: Left;   GIVENS CAPSULE STUDY  01/20/2007   Fields-nothing to explain IDA, lymphectasias   LEFT HEART CATH AND CORONARY ANGIOGRAPHY N/A 06/15/2020   Procedure: LEFT HEART CATH AND CORONARY ANGIOGRAPHY;  Surgeon: JMartinique Peter  M, MD;  [facility];  Service: Cardiovascular;  Laterality: N/A;   removal of skin cancer twice     Family History  Problem Relation Age of Onset   Heart defect Mother        pacemaker    Alzheimer's disease Mother 83       2011   Heart defect Brother        bradycardia    Heart failure Father    Asthma Father    Hypertension Daughter    Kidney Stones Daughter    Colon cancer Neg Hx    Social History   Socioeconomic History   Marital status: Married    Spouse name:  brenda    Number of children: 1   Years of education: 10   Highest education level: 10th grade  Occupational History   Occupation: retired, 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  Tobacco Use   Smoking status: Former    Packs/day: 2.00    Years: 15.00    Total pack years: 30.00    Types: Cigarettes    Quit date: 10/20/1978    Years since quitting: 43.9   Smokeless tobacco: Former    Types: Chew    Quit date: 10/20/2004  Vaping Use   Vaping Use: Never used  Substance and Sexual Activity   Alcohol use: No   Drug use: No   Sexual activity: Yes  Other Topics Concern   Not on file  Social History Narrative   1 healthy daughter   Lives alone with wife and dog    Social Determinants of Health   Financial Resource Strain: Low Risk  (09/27/2021)   Overall Financial Resource Strain (CARDIA)    Difficulty of Paying Living Expenses: Not hard at all  Food Insecurity: No Food Insecurity (09/27/2021)   Hunger Vital Sign    Worried About Running Out of Food in the Last Year: Never true    [place] in the Last Year: Never true  Transportation Needs: No Transportation Needs (09/27/2021)   PRAPARE - Hydrologist (Medical): No    Lack of Transportation (Non-Medical): No  Physical Activity: Inactive (09/27/2021)   Exercise Vital Sign    Days of Exercise per Week: 0 days    Minutes of Exercise per Session: 0 min  Stress: No Stress Concern Present (09/26/2020)    [place]    Feeling of Stress : Not at all  Social Connections: Moderately Integrated (09/26/2020)   Social Connection and Isolation Panel [NHANES]    Frequency of Communication with Friends and Family: More than three times a week    Frequency of Social Gatherings with Friends and Family: Twice a week    Attends Religious Services: Never    Marine scientist or Organizations: Yes    Attends Music therapist: More than 4 times per year    Marital Status: Married    Tobacco Counseling Counseling given: Not Answered   Clinical Intake:   Diabetic? No    Activities of Daily Living     No data to display           Patient Care Team: Fayrene Helper, MD as PCP - [facility], Marga Melnick, MD (Inactive) (Gastroenterology) Sandford Craze, MD as Referring Physician (Dermatology)  Indicate any recent Medical Services you may have received from other than [facility] providers in the past year (date may be approximate).     Assessment:   This is a routine wellness examination for Jose.  Hearing/Vision screen No results found.  Dietary issues and exercise activities discussed:     Goals Addressed   None   Depression Screen    06/24/2022    9:28 AM 04/29/2022    8:24 AM 10/24/2021    8:06 AM 06/25/2021    8:10 AM 03/28/2021   11:01 AM 01/21/2021    1:36 PM 01/21/2021    1:19 PM  PHQ 2/9 Scores  PHQ - 2 Score 0 0 0 0 0 0 0    Fall Risk    06/24/2022    9:27 AM 04/29/2022  8:24 AM 02/21/2022    8:10 AM 10/24/2021    8:05 AM 06/25/2021    8:10 AM  Fall Risk   Falls in the past year? 0 0 0 0 0  Number falls in past yr: 0 0 0 0 0  Injury with Fall? 0 0 0 0 0  Risk for fall due to : No Fall Risks No Fall Risks  No Fall Risks   Follow up Falls evaluation completed Falls evaluation completed  Falls evaluation completed     FALL RISK PREVENTION PERTAINING TO THE HOME:  Any stairs in or around  the home? Yes  If so, are there any without handrails? Yes  Home free of loose throw rugs in walkways, pet beds, electrical cords, etc? Yes  Adequate lighting in your home to reduce risk of falls? Yes   ASSISTIVE DEVICES UTILIZED TO PREVENT FALLS:  Life alert? No  Use of a cane, walker or w/c? Yes  Grab bars in the bathroom? Yes  Shower chair or bench in shower? Yes  Elevated toilet seat or a handicapped toilet? Yes        09/27/2021    3:37 PM 09/26/2020    8:18 AM 09/26/2019    8:19 AM 09/13/2018    9:58 AM 06/24/2017    4:01 PM  6CIT Screen  What Year? 0 points 0 points 0 points 0 points 0 points  What month? 0 points 0 points 0 points 0 points 0 points  What time?  0 points 0 points 0 points 0 points  Count back from 20  0 points 0 points 0 points 0 points  Months in reverse  0 points 0 points 0 points 0 points  Repeat phrase  0 points 0 points 2 points 0 points  Total Score  0 points 0 points 2 points 0 points    Immunizations Immunization History  Administered Date(s) Administered   Fluad Quad(high Dose 65+) 07/25/2019, 07/27/2020, 06/25/2021, 07/09/2022   Influenza Whole 08/12/2010, 08/11/2011   Influenza, High Dose Seasonal PF 07/28/2018   Influenza,inj,Quad PF,6+ Mos 07/27/2013, 07/27/2014, 08/07/2015, 09/01/2016, 08/26/2017   Moderna Covid-19 Vaccine Bivalent Booster 25yr & up 07/29/2022   Moderna SARS-COV2 Booster Vaccination 11/01/2019   Pneumococcal Conjugate-13 05/01/2014   Pneumococcal Polysaccharide-23 04/29/2013   Tdap 08/11/2011   Unspecified SARS-COV-2 Vaccination 11/01/2019, 12/02/2019, 09/06/2020   Zoster, Live 04/10/2014    TDAP status: Due, Education has been provided regarding the importance of this vaccine. Advised may receive this vaccine at local pharmacy or Health Dept. Aware to provide a copy of the vaccination record if obtained from local pharmacy or Health Dept. Verbalized acceptance and understanding.  Flu Vaccine status: Up to  date  Pneumococcal vaccine status: Up to date  Covid-19 vaccine status: Completed vaccines  Qualifies for Shingles Vaccine? Yes   Zostavax completed No   Shingrix Completed?: No.    Education has been provided regarding the importance of this vaccine. Patient has been advised to call insurance company to determine out of pocket expense if they have not yet received this vaccine. Advised may also receive vaccine at local pharmacy or Health Dept. Verbalized acceptance and understanding.  Screening Tests Health Maintenance  Topic Date Due   Zoster Vaccines- Shingrix (1 of 2) Never done   DTaP/Tdap/Td (2 - Td or Tdap) 08/10/2021   COVID-19 Vaccine (5 - 2023-24 season) 09/23/2022   Medicare Annual Wellness (AWV)  10/07/2023   Pneumonia Vaccine 80 Years old  Completed   INFLUENZA VACCINE  Completed   HPV VACCINES  Aged Out    Health Maintenance  Health Maintenance Due  Topic Date Due   Zoster Vaccines- Shingrix (1 of 2) Never done   DTaP/Tdap/Td (2 - Td or Tdap) 08/10/2021   COVID-19 Vaccine (5 - 2023-24 season) 09/23/2022    Colorectal cancer screening: No longer required.   Lung Cancer Screening: (Low Dose CT Chest recommended if Age 7-80 years, 30 pack-year currently smoking OR have quit w/in 15years.) does not qualify.   Lung Cancer Screening Referral: No  Additional Screening:   Vision Screening: Recommended annual ophthalmology exams for early detection of glaucoma and other disorders of the eye. Is the patient up to date with their annual eye exam?  No  Who is the provider or what is the name of the office in which the patient attends annual eye exams? N/a If pt is not established with a provider, would they like to be referred to a provider to establish care? No .   Dental Screening: Recommended annual dental exams for proper oral hygiene  Community Resource Referral / Chronic Care Management: CRR required this visit?  No   CCM required this visit?  No       Plan:     I have personally reviewed and noted the following in the patient's chart:   Medical and social history Use of alcohol, tobacco or illicit drugs  Current medications and supplements including opioid prescriptions. Patient is not currently taking opioid prescriptions. Functional ability and status Nutritional status Physical activity Advanced directives List of other physicians Hospitalizations, surgeries, and ER visits in previous 12 months Vitals Screenings to include cognitive, depression, and falls Referrals and appointments  In addition, I have reviewed and discussed with patient certain preventive protocols, quality metrics, and best practice recommendations. A written personalized care plan for preventive services as well as general preventive health recommendations were provided to patient.     Quentin Angst, [place]   10/06/2022

## 2022-10-22 ENCOUNTER — Other Ambulatory Visit: Payer: Self-pay | Admitting: Family Medicine

## 2022-10-23 DIAGNOSIS — M79676 Pain in unspecified toe(s): Secondary | ICD-10-CM | POA: Diagnosis not present

## 2022-10-23 DIAGNOSIS — E669 Obesity, unspecified: Secondary | ICD-10-CM | POA: Diagnosis not present

## 2022-10-23 DIAGNOSIS — Z6832 Body mass index (BMI) 32.0-32.9, adult: Secondary | ICD-10-CM | POA: Diagnosis not present

## 2022-10-23 DIAGNOSIS — M1A09X0 Idiopathic chronic gout, multiple sites, without tophus (tophi): Secondary | ICD-10-CM | POA: Diagnosis not present

## 2022-11-13 ENCOUNTER — Ambulatory Visit: Payer: PPO | Admitting: Internal Medicine

## 2022-11-20 ENCOUNTER — Encounter: Payer: PPO | Admitting: Family Medicine

## 2022-11-21 ENCOUNTER — Other Ambulatory Visit: Payer: Self-pay | Admitting: Family Medicine

## 2022-11-21 DIAGNOSIS — N401 Enlarged prostate with lower urinary tract symptoms: Secondary | ICD-10-CM

## 2022-12-06 ENCOUNTER — Other Ambulatory Visit: Payer: Self-pay | Admitting: Family Medicine

## 2022-12-08 DIAGNOSIS — D485 Neoplasm of uncertain behavior of skin: Secondary | ICD-10-CM | POA: Diagnosis not present

## 2022-12-08 DIAGNOSIS — L570 Actinic keratosis: Secondary | ICD-10-CM | POA: Diagnosis not present

## 2022-12-08 DIAGNOSIS — C44629 Squamous cell carcinoma of skin of left upper limb, including shoulder: Secondary | ICD-10-CM | POA: Diagnosis not present

## 2022-12-11 DIAGNOSIS — C44629 Squamous cell carcinoma of skin of left upper limb, including shoulder: Secondary | ICD-10-CM | POA: Diagnosis not present

## 2022-12-21 ENCOUNTER — Other Ambulatory Visit: Payer: Self-pay | Admitting: Family Medicine

## 2022-12-28 ENCOUNTER — Other Ambulatory Visit: Payer: Self-pay | Admitting: Family Medicine

## 2023-01-11 ENCOUNTER — Other Ambulatory Visit: Payer: Self-pay | Admitting: Family Medicine

## 2023-01-11 DIAGNOSIS — N401 Enlarged prostate with lower urinary tract symptoms: Secondary | ICD-10-CM

## 2023-01-25 ENCOUNTER — Other Ambulatory Visit: Payer: Self-pay | Admitting: Family Medicine

## 2023-02-24 ENCOUNTER — Other Ambulatory Visit: Payer: Self-pay | Admitting: Family Medicine

## 2023-02-26 DIAGNOSIS — Z6832 Body mass index (BMI) 32.0-32.9, adult: Secondary | ICD-10-CM | POA: Diagnosis not present

## 2023-02-26 DIAGNOSIS — M1A09X0 Idiopathic chronic gout, multiple sites, without tophus (tophi): Secondary | ICD-10-CM | POA: Diagnosis not present

## 2023-02-26 DIAGNOSIS — E669 Obesity, unspecified: Secondary | ICD-10-CM | POA: Diagnosis not present

## 2023-02-26 DIAGNOSIS — M79676 Pain in unspecified toe(s): Secondary | ICD-10-CM | POA: Diagnosis not present

## 2023-03-24 ENCOUNTER — Other Ambulatory Visit: Payer: Self-pay | Admitting: Family Medicine

## 2023-03-30 DIAGNOSIS — M62830 Muscle spasm of back: Secondary | ICD-10-CM | POA: Diagnosis not present

## 2023-03-30 DIAGNOSIS — M9902 Segmental and somatic dysfunction of thoracic region: Secondary | ICD-10-CM | POA: Diagnosis not present

## 2023-03-30 DIAGNOSIS — M9903 Segmental and somatic dysfunction of lumbar region: Secondary | ICD-10-CM | POA: Diagnosis not present

## 2023-03-30 DIAGNOSIS — M9905 Segmental and somatic dysfunction of pelvic region: Secondary | ICD-10-CM | POA: Diagnosis not present

## 2023-04-01 ENCOUNTER — Other Ambulatory Visit: Payer: Self-pay | Admitting: Family Medicine

## 2023-04-02 DIAGNOSIS — M9905 Segmental and somatic dysfunction of pelvic region: Secondary | ICD-10-CM | POA: Diagnosis not present

## 2023-04-02 DIAGNOSIS — M62830 Muscle spasm of back: Secondary | ICD-10-CM | POA: Diagnosis not present

## 2023-04-02 DIAGNOSIS — M9902 Segmental and somatic dysfunction of thoracic region: Secondary | ICD-10-CM | POA: Diagnosis not present

## 2023-04-02 DIAGNOSIS — M9903 Segmental and somatic dysfunction of lumbar region: Secondary | ICD-10-CM | POA: Diagnosis not present

## 2023-04-06 DIAGNOSIS — M9903 Segmental and somatic dysfunction of lumbar region: Secondary | ICD-10-CM | POA: Diagnosis not present

## 2023-04-06 DIAGNOSIS — M9905 Segmental and somatic dysfunction of pelvic region: Secondary | ICD-10-CM | POA: Diagnosis not present

## 2023-04-06 DIAGNOSIS — M62830 Muscle spasm of back: Secondary | ICD-10-CM | POA: Diagnosis not present

## 2023-04-06 DIAGNOSIS — M9902 Segmental and somatic dysfunction of thoracic region: Secondary | ICD-10-CM | POA: Diagnosis not present

## 2023-04-23 ENCOUNTER — Other Ambulatory Visit: Payer: Self-pay | Admitting: Family Medicine

## 2023-04-29 DIAGNOSIS — M65341 Trigger finger, right ring finger: Secondary | ICD-10-CM | POA: Diagnosis not present

## 2023-05-07 DIAGNOSIS — M65341 Trigger finger, right ring finger: Secondary | ICD-10-CM | POA: Diagnosis not present

## 2023-05-09 ENCOUNTER — Other Ambulatory Visit: Payer: Self-pay | Admitting: Family Medicine

## 2023-05-14 ENCOUNTER — Telehealth: Payer: Self-pay | Admitting: *Deleted

## 2023-05-14 NOTE — Telephone Encounter (Signed)
I connected with [name] [name] on 7/25 at 1026 by telephone and verified that I am speaking with the correct person using two identifiers. According to the patient's chart they are due for annual exam  with [facility]. Pt scheduled. There are no transportation issues at this time. Nothing further was needed at the end of our conversation.

## 2023-05-21 DIAGNOSIS — M79644 Pain in right finger(s): Secondary | ICD-10-CM | POA: Diagnosis not present

## 2023-05-29 ENCOUNTER — Other Ambulatory Visit: Payer: Self-pay | Admitting: Family Medicine

## 2023-05-29 ENCOUNTER — Other Ambulatory Visit: Payer: Self-pay | Admitting: Internal Medicine

## 2023-06-04 DIAGNOSIS — L570 Actinic keratosis: Secondary | ICD-10-CM | POA: Diagnosis not present

## 2023-06-04 DIAGNOSIS — C44629 Squamous cell carcinoma of skin of left upper limb, including shoulder: Secondary | ICD-10-CM | POA: Diagnosis not present

## 2023-06-04 DIAGNOSIS — D485 Neoplasm of uncertain behavior of skin: Secondary | ICD-10-CM | POA: Diagnosis not present

## 2023-06-08 ENCOUNTER — Telehealth: Payer: Self-pay | Admitting: Family Medicine

## 2023-06-08 MED ORDER — DIPHENOXYLATE-ATROPINE 2.5-0.025 MG PO TABS: 1.0000 | ORAL_TABLET | Freq: Four times a day (QID) | ORAL | 0 refills | Status: DC | PRN

## 2023-06-08 NOTE — Telephone Encounter (Signed)
Lomotil sent if persists / worsens/ vomiting  and possibility of dehydration, need visit

## 2023-06-08 NOTE — Telephone Encounter (Signed)
Called in on patient behalf. Patient has been experiencing diarrhea for past few days. Does not think he will make in for office visit without accident. Wants to see if provider can prescribe something to help. Wants a call back in regard.

## 2023-06-09 NOTE — Telephone Encounter (Signed)
Patient aware.

## 2023-06-18 DIAGNOSIS — C44629 Squamous cell carcinoma of skin of left upper limb, including shoulder: Secondary | ICD-10-CM | POA: Diagnosis not present

## 2023-06-21 ENCOUNTER — Other Ambulatory Visit: Payer: Self-pay | Admitting: Family Medicine

## 2023-06-21 DIAGNOSIS — N401 Enlarged prostate with lower urinary tract symptoms: Secondary | ICD-10-CM

## 2023-06-25 ENCOUNTER — Ambulatory Visit (INDEPENDENT_AMBULATORY_CARE_PROVIDER_SITE_OTHER): Payer: PPO | Admitting: Family Medicine

## 2023-06-25 ENCOUNTER — Encounter: Payer: Self-pay | Admitting: Family Medicine

## 2023-06-25 VITALS — BP 135/77 | HR 54 | Ht 68.0 in | Wt 225.0 lb

## 2023-06-25 DIAGNOSIS — E559 Vitamin D deficiency, unspecified: Secondary | ICD-10-CM

## 2023-06-25 DIAGNOSIS — E782 Mixed hyperlipidemia: Secondary | ICD-10-CM

## 2023-06-25 DIAGNOSIS — M1A9XX1 Chronic gout, unspecified, with tophus (tophi): Secondary | ICD-10-CM

## 2023-06-25 DIAGNOSIS — Z0001 Encounter for general adult medical examination with abnormal findings: Secondary | ICD-10-CM

## 2023-06-25 DIAGNOSIS — Z125 Encounter for screening for malignant neoplasm of prostate: Secondary | ICD-10-CM | POA: Diagnosis not present

## 2023-06-25 DIAGNOSIS — I209 Angina pectoris, unspecified: Secondary | ICD-10-CM

## 2023-06-25 DIAGNOSIS — T733XXD Exhaustion due to excessive exertion, subsequent encounter: Secondary | ICD-10-CM

## 2023-06-25 DIAGNOSIS — I10 Essential (primary) hypertension: Secondary | ICD-10-CM

## 2023-06-25 DIAGNOSIS — R200 Anesthesia of skin: Secondary | ICD-10-CM

## 2023-06-25 DIAGNOSIS — T733XXA Exhaustion due to excessive exertion, initial encounter: Secondary | ICD-10-CM | POA: Insufficient documentation

## 2023-06-25 DIAGNOSIS — R7989 Other specified abnormal findings of blood chemistry: Secondary | ICD-10-CM | POA: Diagnosis not present

## 2023-06-25 DIAGNOSIS — Z23 Encounter for immunization: Secondary | ICD-10-CM | POA: Diagnosis not present

## 2023-06-25 NOTE — Patient Instructions (Addendum)
F/u in 5 months, call if you need me sooner  Labs today CBC, lipid, cmp and eGFR, TSH . Vit D and PSA and uric acid  level   You are being referred to Cardiology You are referred to Dr Amanda Pea re numbness  in hands  Pls ask about physical therapy  for chronic backl pain at your Ortho office   Please get Covid, TdAP, and shingrix vaccines  Eye surgery needed for caaracts  It is important that you exercise regularly at least 30 minutes 5 times a week. If you develop chest pain, have severe difficulty breathing, or feel very tired, stop exercising immediately and seek medical attention   Thanks for choosing [facility], we consider it a privelige to serve you.

## 2023-06-25 NOTE — Assessment & Plan Note (Signed)
Reports progressive exercise intolerance and fatigue with minimal activity, refer Card, sibling also had stents in his 81's

## 2023-06-25 NOTE — Progress Notes (Signed)
   [name] [name]     MRN: [id number]      DOB: 1941/08/22  Chief Complaint  Patient presents with   Annual Exam    CPE L-50 R-30 B-40   Flu Vaccine    Given at 8:38 am 06/25/2023    HPI: Patient is in for annual physical exam. C/o numbness in both hands , progressing C/o increased exertional fatigue and poor exercise tolerance, brother had stents in his 52's  Immunization is reviewed , and  updated additional vaccines needed    PE; BP 135/77 (BP Location: Right Arm, Patient Position: Sitting, Cuff Size: Large)   Pulse (!) 54   Ht 5\' 8"  (1.727 m)   Wt 225 lb 0.6 oz (102.1 kg)   SpO2 94%   BMI 34.22 kg/m   Pleasant male, alert and oriented x 3, in no cardio-pulmonary distress. Afebrile. HEENT No facial trauma or asymetry. Sinuses non tender. EOMI External ears normal,  Neck: , no adenopathy,JVD or thyromegaly.No bruits.  Chest: Clear to ascultation bilaterally.No crackles or wheezes.decreased air entry Non tender to palpation  Cardiovascular system; Heart sounds normal,  S1 and  S2 ,no S3.  No murmur, or thrill. Peripheral pulses normal.  Abdomen: Soft, non tender, no organomegaly or masses. No bruits. Bowel sounds normal. No guarding, tenderness or rebound.    Musculoskeletal exam: Decreased  ROM of spine, hips , shoulders and knees.Mild thenar wasting No deformity ,swelling or crepitus noted. No muscle wasting or atrophy.   Neurologic: Cranial nerves 2 to 12 intact. Power, tone ,sensation s normal throughout. . No tremor.  Skin: Intact, no ulceration, erythema , scaling or rash noted. Pigmentation normal throughout  Psych; Normal mood and affect. Judgement and concentration normal   Assessment & Plan:  Encounter for Medicare annual examination with abnormal findings Annual exam as documented.  Immunization and cancer screening needs are specifically addressed at this visit.   Angina pectoris (HCC) Increased exertional fatigue and poor  exercise tolerance needs cardiology re eval  Fatigue due to excessive exertion Reports progressive exercise intolerance and fatigue with minimal activity, refer Card, sibling also had stents in his 60's  Bilateral hand numbness Impression is CTS, refer Ortho  Immunization due After obtaining informed consent, the influenza vaccine is  administered , with no adverse effect noted at the time of administration.

## 2023-06-25 NOTE — Assessment & Plan Note (Signed)
Impression is CTS, refer Ortho

## 2023-06-25 NOTE — Assessment & Plan Note (Signed)
Annual exam as documented. . Immunization and cancer screening needs are specifically addressed at this visit.  

## 2023-06-25 NOTE — Assessment & Plan Note (Signed)
After obtaining informed consent, the influenza vaccine is  administered , with no adverse effect noted at the time of administration.

## 2023-06-25 NOTE — Assessment & Plan Note (Signed)
Increased exertional fatigue and poor exercise tolerance needs cardiology re eval

## 2023-06-26 DIAGNOSIS — G5603 Carpal tunnel syndrome, bilateral upper limbs: Secondary | ICD-10-CM | POA: Diagnosis not present

## 2023-06-26 DIAGNOSIS — Z4789 Encounter for other orthopedic aftercare: Secondary | ICD-10-CM | POA: Diagnosis not present

## 2023-06-26 LAB — CBC
Hematocrit: 38.2 % (ref 37.5–51.0)
Hemoglobin: 12.7 g/dL — ABNORMAL LOW (ref 13.0–17.7)
MCH: 29.1 pg (ref 26.6–33.0)
MCHC: 33.2 g/dL (ref 31.5–35.7)
MCV: 87 fL (ref 79–97)
Platelets: 194 10*3/uL (ref 150–450)
RBC: 4.37 x10E6/uL (ref 4.14–5.80)
RDW: 14.5 % (ref 11.6–15.4)
WBC: 4.7 10*3/uL (ref 3.4–10.8)

## 2023-06-26 LAB — LIPID PANEL
Chol/HDL Ratio: 3.1 ratio (ref 0.0–5.0)
Cholesterol, Total: 154 mg/dL (ref 100–199)
HDL: 50 mg/dL (ref >39)
LDL Chol Calc (NIH): 78 mg/dL (ref 0–99)
Triglycerides: 150 mg/dL — ABNORMAL HIGH (ref 0–149)
VLDL Cholesterol Cal: 26 mg/dL (ref 5–40)

## 2023-06-26 LAB — CMP14+EGFR
ALT: 22 IU/L (ref 0–44)
AST: 22 IU/L (ref 0–40)
Albumin: 4.5 g/dL (ref 3.8–4.8)
Alkaline Phosphatase: 61 IU/L (ref 44–121)
BUN/Creatinine Ratio: 14 (ref 10–24)
BUN: 20 mg/dL (ref 8–27)
Bilirubin Total: 0.4 mg/dL (ref 0.0–1.2)
CO2: 24 mmol/L (ref 20–29)
Calcium: 9.8 mg/dL (ref 8.6–10.2)
Chloride: 106 mmol/L (ref 96–106)
Creatinine, Ser: 1.48 mg/dL — ABNORMAL HIGH (ref 0.76–1.27)
Globulin, Total: 1.8 g/dL (ref 1.5–4.5)
Glucose: 91 mg/dL (ref 70–99)
Potassium: 4.8 mmol/L (ref 3.5–5.2)
Sodium: 143 mmol/L (ref 134–144)
Total Protein: 6.3 g/dL (ref 6.0–8.5)
eGFR: 48 mL/min/{1.73_m2} — ABNORMAL LOW (ref >59)

## 2023-06-26 LAB — TSH: TSH: 3.2 u[IU]/mL (ref 0.450–4.500)

## 2023-06-26 LAB — PSA: Prostate Specific Ag, Serum: 0.9 ng/mL (ref 0.0–4.0)

## 2023-06-26 LAB — URIC ACID: Uric Acid: 4.5 mg/dL (ref 3.8–8.4)

## 2023-06-29 ENCOUNTER — Other Ambulatory Visit: Payer: Self-pay | Admitting: Family Medicine

## 2023-07-03 DIAGNOSIS — G5603 Carpal tunnel syndrome, bilateral upper limbs: Secondary | ICD-10-CM | POA: Diagnosis not present

## 2023-07-13 ENCOUNTER — Telehealth: Payer: Self-pay | Admitting: Family Medicine

## 2023-07-13 NOTE — Telephone Encounter (Signed)
Pt wife LVM says pt is needing a refill on Potassium- was denied at [facility]. Please advise Thank you

## 2023-07-14 ENCOUNTER — Other Ambulatory Visit: Payer: Self-pay | Admitting: Family Medicine

## 2023-07-14 MED ORDER — POTASSIUM CHLORIDE CRYS ER 10 MEQ PO TBCR: 10.0000 meq | EXTENDED_RELEASE_TABLET | Freq: Every day | ORAL | 3 refills | Status: DC

## 2023-07-14 NOTE — Telephone Encounter (Signed)
Patient came by office said the pharmacist could not fill this that it was cancelled and he is completing out of this medicine potassium chloride can it be resent to [facility]

## 2023-07-14 NOTE — Telephone Encounter (Signed)
Medication sent.

## 2023-07-17 DIAGNOSIS — G5603 Carpal tunnel syndrome, bilateral upper limbs: Secondary | ICD-10-CM | POA: Diagnosis not present

## 2023-07-22 ENCOUNTER — Other Ambulatory Visit: Payer: Self-pay | Admitting: Family Medicine

## 2023-07-29 ENCOUNTER — Other Ambulatory Visit: Payer: Self-pay | Admitting: Family Medicine

## 2023-08-06 ENCOUNTER — Other Ambulatory Visit: Payer: Self-pay | Admitting: Family Medicine

## 2023-08-21 ENCOUNTER — Other Ambulatory Visit: Payer: Self-pay | Admitting: Family Medicine

## 2023-08-28 ENCOUNTER — Ambulatory Visit: Payer: PPO | Attending: Internal Medicine | Admitting: Internal Medicine

## 2023-08-28 ENCOUNTER — Encounter: Payer: Self-pay | Admitting: Internal Medicine

## 2023-08-28 VITALS — BP 136/92 | HR 65 | Ht 70.0 in | Wt 230.0 lb

## 2023-08-28 DIAGNOSIS — I77819 Aortic ectasia, unspecified site: Secondary | ICD-10-CM | POA: Insufficient documentation

## 2023-08-28 DIAGNOSIS — I209 Angina pectoris, unspecified: Secondary | ICD-10-CM | POA: Diagnosis not present

## 2023-08-28 DIAGNOSIS — I351 Nonrheumatic aortic (valve) insufficiency: Secondary | ICD-10-CM | POA: Diagnosis not present

## 2023-08-28 NOTE — Patient Instructions (Signed)
Medication Instructions:  Your physician recommends that you continue on your current medications as directed. Please refer to the Current Medication list given to you today.  *If you need a refill on your cardiac medications before your next appointment, please call your pharmacy*   Lab Work: None If you have labs (blood work) drawn today and your tests are completely normal, you will receive your results only by: MyChart Message (if you have MyChart) OR A paper copy in the mail If you have any lab test that is abnormal or we need to change your treatment, we will call you to review the results.   Testing/Procedures: Your physician has requested that you have an echocardiogram. Echocardiography is a painless test that uses sound waves to create images of your heart. It provides your doctor with information about the size and shape of your heart and how well your heart's chambers and valves are working. This procedure takes approximately one hour. There are no restrictions for this procedure. Please do NOT wear cologne, perfume, aftershave, or lotions (deodorant is allowed). Please arrive 15 minutes prior to your appointment time.  Please note: We ask at that you not bring children with you during ultrasound (echo/ vascular) testing. Due to room size and safety concerns, children are not allowed in the ultrasound rooms during exams. Our front office staff cannot provide observation of children in our lobby area while testing is being conducted. An adult accompanying a patient to their appointment will only be allowed in the ultrasound room at the discretion of the ultrasound technician under special circumstances. We apologize for any inconvenience.    Follow-Up: At [facility], you and your health needs are our priority.  As part of our continuing mission to provide you with exceptional heart care, we have created designated Provider Care Teams.  These Care Teams include your  primary Cardiologist (physician) and Advanced Practice Providers (APPs -  Physician Assistants and Nurse Practitioners) who all work together to provide you with the care you need, when you need it.  We recommend signing up for the patient portal called "MyChart".  Sign up information is provided on this After Visit Summary.  MyChart is used to connect with patients for Virtual Visits (Telemedicine).  Patients are able to view lab/test results, encounter notes, upcoming appointments, etc.  Non-urgent messages can be sent to your provider as well.   To learn more about what you can do with MyChart, go to [email or web address].    Your next appointment:   2 year(s)  Provider:   You may see Vishnu P Mallipeddi, MD or one of the following Advanced Practice Providers on your designated Care Team:   [place], PA-C  Jacolyn Reedy, [place]     Other Instructions

## 2023-08-28 NOTE — Progress Notes (Addendum)
Cardiology Office Note  Date: 08/28/2023   ID: [name] [name], DOB 04-26-1941, MRN [id number]  PCP:  Kerri Perches, MD  Cardiologist:  Marjo Bicker, MD Electrophysiologist:  None   History of Present Illness: [name] [name] is a 82 y.o. male known to have nonobstructive CAD (LHC in 2021 showed mild luminal irregularities), mild AI with aortic dilatation 40 mm in 2021, HTN, HLD was referred to cardiology clinic for decreased exercise capacity.  Patient reported having arthritis in his knee and ankle, sprained his ankle and also had bunions in both the feet, suffering from these issues for the last 6 months resulting in decreased ambulation distance.  Otherwise no symptoms of angina, DOE, orthopnea, PND, leg swelling, dizziness, presyncope and syncope.  Past Medical History:  Diagnosis Date   Anemia, iron deficiency    Annual physical exam 09/01/2016   Cancer (HCC)    skin cancers    Educated about COVID-19 virus infection 03/13/2019   ELECTROCARDIOGRAM, ABNORMAL 08/18/2010   Qualifier: Diagnosis of  By: Lodema Hong MD, Margaret     GERD (gastroesophageal reflux disease)    H/O hiatal hernia    Headache    Hemorrhoids    and minimal polp   History of kidney stones    History of migraines childhood    Flared up in 2009, and have responded to depakote which was stopped in 9 months   Hx of colonic polyps 02/24/2007   Hyperlipidemia 2003   Hypertension 2003   Low sodium levels 02/20/2017   LUNG NODULE 08/18/2010   Qualifier: Diagnosis of  By: Lodema Hong MD, Margaret  No need for radiologic f/u unless new symptoms develop    Lung nodules since 2009   CT scan advised in 08/2010   Neoplasm    dermal   Nonspecific abnormal findings on radiological and other examination of skull and head 08/18/2010   Qualifier: Diagnosis of  By: Lodema Hong MD, Margaret  Scan of 08/2010 showed no change over a 2 yr period, no need for further scanning.Pt is a non smoker, will not rept unless  symptomatic    Overweight(278.02)     Past Surgical History:  Procedure Laterality Date   COLONOSCOPY  2008   Fields-mod IH, 3mm polyp sigmoid colon (disappeared w/ insufflation & could not be retrieved)   COLONOSCOPY  05/19/2012   Procedure: COLONOSCOPY;  Surgeon: [place], MD;  Location: [facility];  Service: Endoscopy;  Laterality: N/A;  8:30   CYSTOSCOPY W/ URETERAL STENT PLACEMENT Left 11/27/2016   Procedure: CYSTOSCOPY WITH RETROGRADE PYELOGRAM/URETERAL STENT PLACEMENT;  Surgeon: Alfredo Martinez, MD;  Location: [facility] ORS;  Service: Urology;  Laterality: Left;   ESOPHAGOGASTRODUODENOSCOPY  01/05/2007   Fields-sm HH, benign SB biopsy   EXTRACORPOREAL SHOCK WAVE LITHOTRIPSY Left 12/08/2016   Procedure: LEFT EXTRACORPOREAL SHOCK WAVE LITHOTRIPSY (ESWL);  Surgeon: Jerilee Field, MD;  Location: [facility] ORS;  Service: Urology;  Laterality: Left;   GIVENS CAPSULE STUDY  01/20/2007   Fields-nothing to explain IDA, lymphectasias   LEFT HEART CATH AND CORONARY ANGIOGRAPHY N/A 06/15/2020   Procedure: LEFT HEART CATH AND CORONARY ANGIOGRAPHY;  Surgeon: [place], Peter M, MD;  Location: [facility] INVASIVE CV LAB;  Service: Cardiovascular;  Laterality: N/A;   removal of skin cancer twice      Current Outpatient Medications  Medication Sig Dispense Refill   acetaminophen (TYLENOL) 325 MG tablet Take 650 mg by mouth as needed for mild pain.      amLODipine (NORVASC) 2.5 MG tablet Take  1 tablet by mouth once daily 30 tablet 0   aspirin (ASPIRIN LOW DOSE) 81 MG EC tablet Take 1 tablet (81 mg total) by mouth every morning. 30 tablet 12   colchicine 0.6 MG tablet 1 capsule Orally every day to prevent gout flares for 30 days     famotidine (PEPCID) 40 MG tablet Take 1 tablet by mouth once daily 90 tablet 0   finasteride (PROSCAR) 5 MG tablet Take 1 tablet by mouth once daily 90 tablet 0   Krill Oil 300 MG CAPS Take 300 mg by mouth at bedtime.      loratadine (CLARITIN) 10 MG tablet Take 10 mg by mouth daily.      metoprolol tartrate (LOPRESSOR) 100 MG tablet Take 1 tablet by mouth twice daily 180 tablet 0   Misc Natural Products (BLACK CHERRY CONCENTRATE PO) Take 1 Dose by mouth daily.      Multiple Vitamin (MULTIVITAMIN WITH MINERALS) TABS Take 1 tablet by mouth daily.     nitroGLYCERIN (NITROSTAT) 0.4 MG SL tablet Take 1 tab under your tongue while sitting If no relief of chest pain may repeat one tab every 5 minutes up to 3 tablets total over 15 minutes (Patient taking differently: Place 0.4 mg under the tongue every 5 (five) minutes as needed for chest pain. up to 3 tablets total over 15 minutes) 25 tablet 3   potassium chloride (KLOR-CON M) 10 MEQ tablet Take 1 tablet (10 mEq total) by mouth daily. 90 tablet 3   rosuvastatin (CRESTOR) 20 MG tablet Take 1 tablet by mouth once daily 90 tablet 0   terazosin (HYTRIN) 2 MG capsule Take 1 capsule by mouth at bedtime 90 capsule 0   No current facility-administered medications for this visit.   Allergies:  Ace inhibitors   Social History: The patient  reports that he quit smoking about 44 years ago. His smoking use included cigarettes. He started smoking about 59 years ago. He has a 30 pack-year smoking history. He quit smokeless tobacco use about 18 years ago.  His smokeless tobacco use included chew. He reports that he does not drink alcohol and does not use drugs.   Family History: The patient's family history includes Alzheimer's disease (age of onset: 11) in his mother; Asthma in his father; Heart defect in his brother and mother; Heart failure in his father; Hypertension in his daughter; Kidney Stones in his daughter.   ROS:  Please see the history of present illness. Otherwise, complete review of systems is positive for none.  All other systems are reviewed and negative.   Physical Exam: VS:  BP (!) 144/88   Pulse 65   Ht 5\' 10"  (1.778 m)   Wt 230 lb (104.3 kg)   SpO2 94%   BMI 33.00 kg/m , BMI Body mass index is 33 kg/m.  Wt Readings from  Last 3 Encounters:  08/28/23 230 lb (104.3 kg)  06/25/23 225 lb 0.6 oz (102.1 kg)  06/24/22 222 lb 1.3 oz (100.7 kg)    General: Patient appears comfortable at rest. HEENT: Conjunctiva and lids normal, oropharynx clear with moist mucosa. Neck: Supple, no elevated JVP or carotid bruits, no thyromegaly. Lungs: Clear to auscultation, nonlabored breathing at rest. Cardiac: Regular rate and rhythm, no S3 or significant systolic murmur, no pericardial rub. Abdomen: Soft, nontender, no hepatomegaly, bowel sounds present, no guarding or rebound. Extremities: No pitting edema, distal pulses 2+. Skin: Warm and dry. Musculoskeletal: No kyphosis. Neuropsychiatric: Alert and oriented x3, affect grossly  appropriate.  Recent Labwork: 06/25/2023: ALT 22; AST 22; BUN 20; Creatinine, Ser 1.48; Hemoglobin 12.7; Platelets 194; Potassium 4.8; Sodium 143; TSH 3.200     Component Value Date/Time   CHOL 154 06/25/2023 0924   TRIG 150 (H) 06/25/2023 0924   HDL 50 06/25/2023 0924   CHOLHDL 3.1 06/25/2023 0924   CHOLHDL 3.8 09/28/2019 1027   VLDL 23 02/17/2017 0803   LDLCALC 78 06/25/2023 0924   LDLCALC 115 (H) 09/28/2019 1027     Assessment and Plan:  False positive cardiac stress test (LHC-minimal luminal irregularities in 2021) Mild AI with aortic root dilatation 40 mm in 2021 HTN, controlled HLD, at goal   -Patient has arthritis in his knee/ankle, sprained his ankle and had bunions with gout later on which was limiting his walking distance. LHC in 2021 showed minimal luminal irregularities. No angina, no indication of ischemia evaluation at this time.  Okay to stop aspirin 81. -Echocardiogram in 2021 showed mild AI with aortic root dilatation 40 mm.  Will repeat echocardiogram for surveillance of AI. Otherwise he is asymptomatic, compensated. -Continue amlodipine 2.5 mg once daily, metoprolol tartrate 100 mg twice daily (he has first-degree AV block on the EKG today and denies having symptoms of  dizziness/lightheadedness or syncope).  If he develops the symptoms in the future, will need to cut back on metoprolol dose.  Follows with PCP. -Continue rosuvastatin 20 mg nightly, goal LDL less than 100, okay to cut back on the dose from 20 to 10 mg at bedtime due to no CAD and age > 75 years.   I spent a total duration of 45 minutes review prior medications, notes, EKG, labs, face-to-face discussion/counseling of his medical condition, pathophysiology, evaluation, management, ordering test and documenting the findings in the note.    Medication Adjustments/Labs and Tests Ordered: Current medicines are reviewed at length with the patient today.  Concerns regarding medicines are outlined above.    Disposition:  Follow up 2 years   Signed, Maleigha Colvard Verne Spurr, MD, 08/28/2023 3:15 PM    [facility] at [facility] [address] [place], [place] [address]

## 2023-08-29 ENCOUNTER — Other Ambulatory Visit: Payer: Self-pay | Admitting: Family Medicine

## 2023-09-01 ENCOUNTER — Telehealth: Payer: Self-pay

## 2023-09-01 ENCOUNTER — Encounter: Payer: Self-pay | Admitting: Internal Medicine

## 2023-09-01 ENCOUNTER — Ambulatory Visit (INDEPENDENT_AMBULATORY_CARE_PROVIDER_SITE_OTHER): Payer: PPO | Admitting: Internal Medicine

## 2023-09-01 VITALS — BP 120/71 | HR 65 | Ht 70.0 in | Wt 230.8 lb

## 2023-09-01 DIAGNOSIS — J029 Acute pharyngitis, unspecified: Secondary | ICD-10-CM | POA: Diagnosis not present

## 2023-09-01 MED ORDER — AZITHROMYCIN 250 MG PO TABS: ORAL_TABLET | ORAL | 0 refills | Status: AC

## 2023-09-01 MED ORDER — ROSUVASTATIN CALCIUM 10 MG PO TABS: 10.0000 mg | ORAL_TABLET | Freq: Every day | ORAL | 3 refills | Status: DC

## 2023-09-01 MED ORDER — PROMETHAZINE-DM 6.25-15 MG/5ML PO SYRP: 5.0000 mL | ORAL_SOLUTION | Freq: Four times a day (QID) | ORAL | 0 refills | Status: DC | PRN

## 2023-09-01 NOTE — Telephone Encounter (Signed)
Patient notified and verbalized understanding of medication changes. Pt had no questions or concerns at this time.

## 2023-09-01 NOTE — Patient Instructions (Signed)
Please start taking Azithromycin as prescribed.  Please take Promethazine DM syrup as needed for cough.  Please perform warm water gargling for sore throat.  Use humidifier at nighttime to avoid dry air exposure.

## 2023-09-01 NOTE — Progress Notes (Signed)
Acute Office Visit  Subjective:    Patient ID: [name] [name], male    DOB: Sep 30, 1941, 82 y.o.   MRN: [id number]  Chief Complaint  Patient presents with   Sore Throat    Sore throat for over a week , difficulty swallowing     HPI Patient is in today for complaint of sore throat, painful swallowing and dry cough  for the last 1 week.  Denies any nasal congestion or postnasal drip.  He has tried taking promethazine DM syrup as needed for cough with mild relief.  Denies any dyspnea or wheezing currently.  Denies any fever or chills currently.  Past Medical History:  Diagnosis Date   Anemia, iron deficiency    Annual physical exam 09/01/2016   Cancer (HCC)    skin cancers    Educated about COVID-19 virus infection 03/13/2019   ELECTROCARDIOGRAM, ABNORMAL 08/18/2010   Qualifier: Diagnosis of  By: Lodema Hong MD, Margaret     GERD (gastroesophageal reflux disease)    H/O hiatal hernia    Headache    Hemorrhoids    and minimal polp   History of kidney stones    History of migraines childhood    Flared up in 2009, and have responded to depakote which was stopped in 9 months   Hx of colonic polyps 02/24/2007   Hyperlipidemia 2003   Hypertension 2003   Low sodium levels 02/20/2017   LUNG NODULE 08/18/2010   Qualifier: Diagnosis of  By: Lodema Hong MD, Margaret  No need for radiologic f/u unless new symptoms develop    Lung nodules since 2009   CT scan advised in 08/2010   Neoplasm    dermal   Nonspecific abnormal findings on radiological and other examination of skull and head 08/18/2010   Qualifier: Diagnosis of  By: Lodema Hong MD, Margaret  Scan of 08/2010 showed no change over a 2 yr period, no need for further scanning.Pt is a non smoker, will not rept unless symptomatic    Overweight(278.02)     Past Surgical History:  Procedure Laterality Date   COLONOSCOPY  2008   Fields-mod IH, 3mm polyp sigmoid colon (disappeared w/ insufflation & could not be retrieved)   COLONOSCOPY   05/19/2012   Procedure: COLONOSCOPY;  Surgeon: West Bali, MD;  Location: [facility];  Service: Endoscopy;  Laterality: N/A;  8:30   CYSTOSCOPY W/ URETERAL STENT PLACEMENT Left 11/27/2016   Procedure: CYSTOSCOPY WITH RETROGRADE PYELOGRAM/URETERAL STENT PLACEMENT;  Surgeon: Alfredo Martinez, MD;  Location: [facility];  Service: Urology;  Laterality: Left;   ESOPHAGOGASTRODUODENOSCOPY  01/05/2007   Fields-sm HH, benign SB biopsy   EXTRACORPOREAL SHOCK WAVE LITHOTRIPSY Left 12/08/2016   Procedure: LEFT EXTRACORPOREAL SHOCK WAVE LITHOTRIPSY (ESWL);  Surgeon: Jerilee Field, MD;  Location: [facility];  Service: Urology;  Laterality: Left;   GIVENS CAPSULE STUDY  01/20/2007   Fields-nothing to explain IDA, lymphectasias   LEFT HEART CATH AND CORONARY ANGIOGRAPHY N/A 06/15/2020   Procedure: LEFT HEART CATH AND CORONARY ANGIOGRAPHY;  Surgeon: [place], Peter M, MD;  Location: [facility] INVASIVE CV LAB;  Service: Cardiovascular;  Laterality: N/A;   removal of skin cancer twice      Family History  Problem Relation Age of Onset   Heart defect Mother        pacemaker    Alzheimer's disease Mother 73       2011   Heart defect Brother        bradycardia    Heart failure Father  Asthma Father    Hypertension Daughter    Kidney Stones Daughter    Colon cancer Neg Hx     Social History   Socioeconomic History   Marital status: Married    Spouse name: brenda    Number of children: 1   Years of education: 10   Highest education level: 10th grade  Occupational History   Occupation: retired, Marketing executive: SELF EMPLOYED  Tobacco Use   Smoking status: Former    Current packs/day: 0.00    Average packs/day: 2.0 packs/day for 15.0 years (30.0 ttl pk-yrs)    Types: Cigarettes    Start date: 10/21/1963    Quit date: 10/20/1978    Years since quitting: 44.8   Smokeless tobacco: Former    Types: Chew    Quit date: 10/20/2004  Vaping Use   Vaping status: Never Used  Substance and Sexual Activity    Alcohol use: No   Drug use: No   Sexual activity: Yes  Other Topics Concern   Not on file  Social History Narrative   1 healthy daughter   Lives alone with wife and dog    Social Determinants of Health   Financial Resource Strain: Low Risk  (10/06/2022)   Overall Financial Resource Strain (CARDIA)    Difficulty of Paying Living Expenses: Not hard at all  Food Insecurity: No Food Insecurity (10/06/2022)   Hunger Vital Sign    Worried About Running Out of Food in the Last Year: Never true    Ran Out of Food in the Last Year: Never true  Transportation Needs: No Transportation Needs (05/14/2023)   PRAPARE - Administrator, Civil Service (Medical): No    Lack of Transportation (Non-Medical): No  Physical Activity: Insufficiently Active (10/06/2022)   Exercise Vital Sign    Days of Exercise per Week: 3 days    Minutes of Exercise per Session: 30 min  Stress: No Stress Concern Present (10/06/2022)   [employer] of Occupational Health - Occupational Stress Questionnaire    Feeling of Stress : Not at all  Social Connections: Socially Integrated (10/06/2022)   Social Connection and Isolation Panel [NHANES]    Frequency of Communication with Friends and Family: More than three times a week    Frequency of Social Gatherings with Friends and Family: More than three times a week    Attends Religious Services: More than 4 times per year    Active Member of [employer] or Organizations: Yes    Attends Engineer, structural: More than 4 times per year    Marital Status: Married  Catering manager Violence: Not At Risk (10/06/2022)   Humiliation, Afraid, Rape, and Kick questionnaire    Fear of Current or Ex-Partner: No    Emotionally Abused: No    Physically Abused: No    Sexually Abused: No    Outpatient Medications Prior to Visit  Medication Sig Dispense Refill   acetaminophen (TYLENOL) 325 MG tablet Take 650 mg by mouth as needed for mild pain.      amLODipine  (NORVASC) 2.5 MG tablet Take 1 tablet by mouth once daily 30 tablet 0   colchicine 0.6 MG tablet 1 capsule Orally every day to prevent gout flares for 30 days     famotidine (PEPCID) 40 MG tablet Take 1 tablet by mouth once daily 90 tablet 0   finasteride (PROSCAR) 5 MG tablet Take 1 tablet by mouth once daily 90 tablet 0  Krill Oil 300 MG CAPS Take 300 mg by mouth at bedtime.      loratadine (CLARITIN) 10 MG tablet Take 10 mg by mouth daily.     metoprolol tartrate (LOPRESSOR) 100 MG tablet Take 1 tablet by mouth twice daily 180 tablet 0   Misc Natural Products (BLACK CHERRY CONCENTRATE PO) Take 1 Dose by mouth daily.      Multiple Vitamin (MULTIVITAMIN WITH MINERALS) TABS Take 1 tablet by mouth daily.     nitroGLYCERIN (NITROSTAT) 0.4 MG SL tablet Take 1 tab under your tongue while sitting If no relief of chest pain may repeat one tab every 5 minutes up to 3 tablets total over 15 minutes (Patient taking differently: Place 0.4 mg under the tongue every 5 (five) minutes as needed for chest pain. up to 3 tablets total over 15 minutes) 25 tablet 3   potassium chloride (KLOR-CON M) 10 MEQ tablet Take 1 tablet (10 mEq total) by mouth daily. 90 tablet 3   rosuvastatin (CRESTOR) 10 MG tablet Take 1 tablet (10 mg total) by mouth daily. 90 tablet 3   terazosin (HYTRIN) 2 MG capsule Take 1 capsule by mouth at bedtime 90 capsule 0   No facility-administered medications prior to visit.    Allergies  Allergen Reactions   Ace Inhibitors Cough    Family member not sure if he is allergic or not.    Review of Systems  Constitutional:  Negative for chills and fever.  HENT:  Positive for sore throat. Negative for congestion.   Eyes:  Negative for pain and discharge.  Respiratory:  Positive for cough. Negative for shortness of breath.   Cardiovascular:  Negative for chest pain and palpitations.  Gastrointestinal:  Negative for diarrhea, nausea and vomiting.  Endocrine: Negative for polydipsia and  polyuria.  Genitourinary:  Negative for dysuria and hematuria.  Musculoskeletal:  Negative for neck pain and neck stiffness.  Skin:  Negative for rash.  Neurological:  Negative for dizziness and weakness.  Psychiatric/Behavioral:  Negative for agitation and behavioral problems.        Objective:    Physical Exam Vitals reviewed.  Constitutional:      General: He is not in acute distress.    Appearance: He is not diaphoretic.  HENT:     Head: Normocephalic and atraumatic.     Nose: Nose normal.     Mouth/Throat:     Mouth: Mucous membranes are moist.     Pharynx: Posterior oropharyngeal erythema present.  Eyes:     General: No scleral icterus.    Extraocular Movements: Extraocular movements intact.  Cardiovascular:     Rate and Rhythm: Normal rate and regular rhythm.     Heart sounds: Normal heart sounds. No murmur heard. Pulmonary:     Breath sounds: Normal breath sounds. No wheezing or rales.  Musculoskeletal:     Cervical back: Neck supple. No tenderness.     Right lower leg: No edema.     Left lower leg: No edema.  Skin:    General: Skin is warm.     Findings: No rash.  Neurological:     General: No focal deficit present.     Mental Status: He is alert and oriented to person, place, and time.  Psychiatric:        Mood and Affect: Mood normal.        Behavior: Behavior normal.     BP 120/71 (BP Location: Right Arm, Patient Position: Sitting, Cuff Size: Large)  Pulse 65   Ht 5\' 10"  (1.778 m)   Wt 230 lb 12.8 oz (104.7 kg)   SpO2 93%   BMI 33.12 kg/m  Wt Readings from Last 3 Encounters:  09/01/23 230 lb 12.8 oz (104.7 kg)  08/28/23 230 lb (104.3 kg)  06/25/23 225 lb 0.6 oz (102.1 kg)        Assessment & Plan:   Problem List Items Addressed This Visit       Respiratory   Acute pharyngitis - Primary    Has posterior pharyngeal erythema Started empiric azithromycin Perform warm water gargling Phenol or Chloraseptic throat spray as needed for sore  throat Promethazine DM syrup as needed for cough       Relevant Medications   azithromycin (ZITHROMAX) 250 MG tablet   promethazine-dextromethorphan (PROMETHAZINE-DM) 6.25-15 MG/5ML syrup     Meds ordered this encounter  Medications   azithromycin (ZITHROMAX) 250 MG tablet    Sig: Take 2 tablets on day 1, then 1 tablet daily on days 2 through 5    Dispense:  6 tablet    Refill:  0   promethazine-dextromethorphan (PROMETHAZINE-DM) 6.25-15 MG/5ML syrup    Sig: Take 5 mLs by mouth 4 (four) times daily as needed.    Dispense:  118 mL    Refill:  0     Teshia Mahone Concha Se, MD

## 2023-09-01 NOTE — Telephone Encounter (Signed)
-----   Message from Vishnu P Mallipeddi sent at 08/28/2023  5:11 PM EST ----- Regarding: D/c aspirin and decraese rosuvastatin from 20 to 10 mg QHS Saw him today in the office.  Discontinue aspirin as his LHC was clean in 2021. Decrease rosuvastatin from 20 to 10 mg at bedtime due to no CAD and age > 75 years.

## 2023-09-01 NOTE — Assessment & Plan Note (Signed)
Has posterior pharyngeal erythema Started empiric azithromycin Perform warm water gargling Phenol or Chloraseptic throat spray as needed for sore throat Promethazine DM syrup as needed for cough

## 2023-09-02 ENCOUNTER — Other Ambulatory Visit: Payer: Self-pay | Admitting: Family Medicine

## 2023-09-02 MED ORDER — NYSTATIN 100000 UNIT/ML MT SUSP: 5.0000 mL | Freq: Three times a day (TID) | OROMUCOSAL | 0 refills | Status: DC | PRN

## 2023-09-02 NOTE — Progress Notes (Signed)
Med sent for sore throat with difficulty swallowing , will message daughter to collect, this in response to pt msg

## 2023-09-03 ENCOUNTER — Ambulatory Visit: Payer: PPO | Admitting: Cardiology

## 2023-09-05 DIAGNOSIS — J029 Acute pharyngitis, unspecified: Secondary | ICD-10-CM | POA: Diagnosis not present

## 2023-09-07 ENCOUNTER — Other Ambulatory Visit: Payer: Self-pay

## 2023-09-07 ENCOUNTER — Emergency Department (HOSPITAL_COMMUNITY)
Admission: EM | Admit: 2023-09-07 | Discharge: 2023-09-08 | Disposition: A | Discharge status: Home / Self Care | Payer: PPO | Attending: Emergency Medicine | Admitting: Emergency Medicine

## 2023-09-07 ENCOUNTER — Emergency Department (HOSPITAL_COMMUNITY): Payer: PPO

## 2023-09-07 ENCOUNTER — Encounter (HOSPITAL_COMMUNITY): Payer: Self-pay | Admitting: Emergency Medicine

## 2023-09-07 ENCOUNTER — Ambulatory Visit: Payer: Self-pay | Admitting: Family Medicine

## 2023-09-07 DIAGNOSIS — J069 Acute upper respiratory infection, unspecified: Secondary | ICD-10-CM | POA: Diagnosis not present

## 2023-09-07 DIAGNOSIS — Z20822 Contact with and (suspected) exposure to covid-19: Secondary | ICD-10-CM | POA: Diagnosis not present

## 2023-09-07 DIAGNOSIS — R918 Other nonspecific abnormal finding of lung field: Secondary | ICD-10-CM | POA: Diagnosis not present

## 2023-09-07 DIAGNOSIS — I10 Essential (primary) hypertension: Secondary | ICD-10-CM | POA: Diagnosis not present

## 2023-09-07 DIAGNOSIS — Z79899 Other long term (current) drug therapy: Secondary | ICD-10-CM | POA: Diagnosis not present

## 2023-09-07 DIAGNOSIS — R059 Cough, unspecified: Secondary | ICD-10-CM | POA: Diagnosis not present

## 2023-09-07 LAB — CBC WITH DIFFERENTIAL/PLATELET
Abs Immature Granulocytes: 0 10*3/uL (ref 0.00–0.07)
Basophils Absolute: 0 10*3/uL (ref 0.0–0.1)
Basophils Relative: 0 %
Eosinophils Absolute: 0.4 10*3/uL (ref 0.0–0.5)
Eosinophils Relative: 5 %
HCT: 42.5 % (ref 39.0–52.0)
Hemoglobin: 13.4 g/dL (ref 13.0–17.0)
Lymphocytes Relative: 30 %
Lymphs Abs: 2.2 10*3/uL (ref 0.7–4.0)
MCH: 28.8 pg (ref 26.0–34.0)
MCHC: 31.5 g/dL (ref 30.0–36.0)
MCV: 91.2 fL (ref 80.0–100.0)
Monocytes Absolute: 0.9 10*3/uL (ref 0.1–1.0)
Monocytes Relative: 13 %
Neutro Abs: 3.7 10*3/uL (ref 1.7–7.7)
Neutrophils Relative %: 52 %
Platelets: 223 10*3/uL (ref 150–400)
RBC: 4.66 MIL/uL (ref 4.22–5.81)
RDW: 14.7 % (ref 11.5–15.5)
WBC: 7.2 10*3/uL (ref 4.0–10.5)
nRBC: 0 % (ref 0.0–0.2)

## 2023-09-07 LAB — COMPREHENSIVE METABOLIC PANEL
ALT: 39 U/L (ref 0–44)
AST: 40 U/L (ref 15–41)
Albumin: 4.2 g/dL (ref 3.5–5.0)
Alkaline Phosphatase: 51 U/L (ref 38–126)
Anion gap: 9 (ref 5–15)
BUN: 24 mg/dL — ABNORMAL HIGH (ref 8–23)
CO2: 26 mmol/L (ref 22–32)
Calcium: 9.2 mg/dL (ref 8.9–10.3)
Chloride: 104 mmol/L (ref 98–111)
Creatinine, Ser: 1.7 mg/dL — ABNORMAL HIGH (ref 0.61–1.24)
GFR, Estimated: 40 mL/min — ABNORMAL LOW (ref >60)
Glucose, Bld: 97 mg/dL (ref 70–99)
Potassium: 4.2 mmol/L (ref 3.5–5.1)
Sodium: 139 mmol/L (ref 135–145)
Total Bilirubin: 0.8 mg/dL (ref <1.2)
Total Protein: 7.8 g/dL (ref 6.5–8.1)

## 2023-09-07 LAB — RESP PANEL BY RT-PCR (RSV, FLU A&B, COVID)  RVPGX2
Influenza A by PCR: NEGATIVE
Influenza B by PCR: NEGATIVE
Resp Syncytial Virus by PCR: NEGATIVE
SARS Coronavirus 2 by RT PCR: NEGATIVE

## 2023-09-07 LAB — GROUP A STREP BY PCR: Group A Strep by PCR: NOT DETECTED

## 2023-09-07 MED ORDER — SODIUM CHLORIDE 0.9 % IV BOLUS
500.0000 mL | Freq: Once | INTRAVENOUS | Status: DC
  Administered 2023-09-07: 500 mL via INTRAVENOUS

## 2023-09-07 NOTE — ED Triage Notes (Signed)
Pt reports he was diagnosed with bronchitis and then developed a sore throat, pt was seen by PCP and UC and has been on 2 abx, pt now reports decreased appetite, altered taste, dizziness, headache (sore to touch), hiccups that will not stop

## 2023-09-07 NOTE — ED Provider Notes (Signed)
[place] EMERGENCY DEPARTMENT AT [facility] Provider Note   CSN: [id number] Arrival date & time: 09/07/23  1243     History {Add pertinent medical, surgical, social history, OB history to HPI:1}   [name] [name] is a 82 y.o. male.  HPI Patient has history of hypertension hyperlipidemia aortic regurgitation  Patient states he has been having trouble with cough congestion as well as sore throat for the last week or 2..  Patient saw his primary care doctor and was prescribed azithromycin on November 12.  Patient was put on 2 separate antibiotics but his symptoms have persisted.  His sore throat is better than it was earlier but he still has persistent decreased taste decreased appetite.  He is also had some hiccups recently.  Home Medications Prior to Admission medications   Medication Sig Start Date End Date Taking? Authorizing Provider  acetaminophen (TYLENOL) 325 MG tablet Take 650 mg by mouth as needed for mild pain.     [provider]  amLODipine (NORVASC) 2.5 MG tablet Take 1 tablet by mouth once daily 08/24/23   Kerri Perches, MD  colchicine 0.6 MG tablet 1 capsule Orally every day to prevent gout flares for 30 days    [provider]  famotidine (PEPCID) 40 MG tablet Take 1 tablet by mouth once daily 08/06/23   Kerri Perches, MD  finasteride (PROSCAR) 5 MG tablet Take 1 tablet by mouth once daily 06/23/23   Kerri Perches, MD  Krill Oil 300 MG CAPS Take 300 mg by mouth at bedtime.     [provider]  loratadine (CLARITIN) 10 MG tablet Take 10 mg by mouth daily.    [provider]  magic mouthwash (nystatin, lidocaine, diphenhydrAMINE, alum & mag hydroxide) suspension Swish and swallow 5 mLs 3 (three) times daily as needed for mouth pain. 09/02/23   Kerri Perches, MD  metoprolol tartrate (LOPRESSOR) 100 MG tablet Take 1 tablet by mouth twice daily 07/30/23   Kerri Perches, MD  Misc Natural Products  (BLACK CHERRY CONCENTRATE PO) Take 1 Dose by mouth daily.     [provider]  Multiple Vitamin (MULTIVITAMIN WITH MINERALS) TABS Take 1 tablet by mouth daily.    [provider]  nitroGLYCERIN (NITROSTAT) 0.4 MG SL tablet Take 1 tab under your tongue while sitting If no relief of chest pain may repeat one tab every 5 minutes up to 3 tablets total over 15 minutes Patient taking differently: Place 0.4 mg under the tongue every 5 (five) minutes as needed for chest pain. up to 3 tablets total over 15 minutes 05/31/20   Antoine Poche, MD  potassium chloride (KLOR-CON M) 10 MEQ tablet Take 1 tablet (10 mEq total) by mouth daily. 07/14/23   Kerri Perches, MD  promethazine-dextromethorphan (PROMETHAZINE-DM) 6.25-15 MG/5ML syrup Take 5 mLs by mouth 4 (four) times daily as needed. 09/01/23   Anabel Halon, MD  rosuvastatin (CRESTOR) 10 MG tablet Take 1 tablet (10 mg total) by mouth daily. 09/01/23 11/30/23  Mallipeddi, Orion Modest, MD  terazosin (HYTRIN) 2 MG capsule Take 1 capsule by mouth at bedtime 08/31/23   Kerri Perches, MD      Allergies    Ace inhibitors    Review of Systems   Review of Systems  Physical Exam Updated Vital Signs BP 132/84 (BP Location: Left Arm)   Pulse 84   Temp 98 F (36.7 C) (Oral)   Resp 19   Ht  1.778 m (5\' 10" )   Wt 104.3 kg   SpO2 99%   BMI 33.00 kg/m  Physical Exam Vitals and nursing note reviewed.  Constitutional:      General: He is not in acute distress.    Appearance: He is well-developed.  HENT:     Head: Normocephalic and atraumatic.     Right Ear: External ear normal.     Left Ear: External ear normal.     Mouth/Throat:     Pharynx: Posterior oropharyngeal erythema present. No oropharyngeal exudate.     Comments: Few vesicular type lesions posterior oropharynx Eyes:     General: No scleral icterus.       Right eye: No discharge.        Left eye: No discharge.     Conjunctiva/sclera: Conjunctivae normal.  Neck:      Trachea: No tracheal deviation.  Cardiovascular:     Rate and Rhythm: Normal rate and regular rhythm.  Pulmonary:     Effort: Pulmonary effort is normal. No respiratory distress.     Breath sounds: Normal breath sounds. No stridor. No wheezing or rales.  Abdominal:     General: Bowel sounds are normal. There is no distension.     Palpations: Abdomen is soft.     Tenderness: There is no abdominal tenderness. There is no guarding or rebound.  Musculoskeletal:        General: No tenderness or deformity.     Cervical back: Neck supple.  Skin:    General: Skin is warm and dry.     Findings: No rash.  Neurological:     General: No focal deficit present.     Mental Status: He is alert.     Cranial Nerves: No cranial nerve deficit, dysarthria or facial asymmetry.     Sensory: No sensory deficit.     Motor: No abnormal muscle tone or seizure activity.     Coordination: Coordination normal.  Psychiatric:        Mood and Affect: Mood normal.     ED Results / Procedures / Treatments   Labs (all labs ordered are listed, but only abnormal results are displayed) Labs Reviewed  COMPREHENSIVE METABOLIC PANEL - Abnormal; Notable for the following components:      Result Value   BUN 24 (*)    Creatinine, Ser 1.70 (*)    GFR, Estimated 40 (*)    All other components within normal limits  RESP PANEL BY RT-PCR (RSV, FLU A&B, COVID)  RVPGX2  GROUP A STREP BY PCR  CBC WITH DIFFERENTIAL/PLATELET    EKG None  Radiology No results found.  Procedures Procedures  {Document cardiac monitor, telemetry assessment procedure when appropriate:1}  Medications Ordered in ED Medications - No data to display  ED Course/ Medical Decision Making/ A&P Clinical Course as of 09/07/23 2329  Mon Sep 07, 2023  2329 Labs notable for normal CBC.  Metabolic panel does show slightly increased creatinine.  COVID flu RSV negative.  Strep negative [JK]    Clinical Course User Index [JK] Linwood Dibbles, MD    {   Click here for ABCD2, HEART and other calculatorsREFRESH Note before signing :1}                              Medical Decision Making Amount and/or Complexity of Data Reviewed Radiology: ordered.   ***  {Document critical care time when appropriate:1} {Document review of labs and  clinical decision tools ie heart score, Chads2Vasc2 etc:1}  {Document your independent review of radiology images, and any outside records:1} {Document your discussion with family members, caretakers, and with consultants:1} {Document social determinants of health affecting pt's care:1} {Document your decision making why or why not admission, treatments were needed:1} Final Clinical Impression(s) / ED Diagnoses Final diagnoses:  None    Rx / DC Orders ED Discharge Orders     None

## 2023-09-07 NOTE — Telephone Encounter (Signed)
Spoke with daughter [name] [name], given permission by pt. Chief Complaint: weakness, loss of appetite. Symptoms: Sore throat, weakness, "sore head", Frequency: 1 week Pertinent Negatives: Patient denies fevers, CP, diarrhea, vomiting Disposition: [] ED /[] Urgent Care (no appt availability in office) / [] Appointment(In office/virtual)/ []  [facility]/ [] Home Care/ [x] Refused Recommended Disposition /[] [facility]/ [x]  Follow-up with PCP Additional Notes: Pt seen in UC 2 days ago, treated for viral infection, given abx.  Daughter concerned pt is not drinking enough nor eating. Producing urine. Still performing ADLs and walking with stable gait unassisted.  Pt does not live alone. Advised to go to ED, pt refused, Willing to be seen by PCP, sched next available appt-tomorrow 2pm. Advised if worsening s/s please call back or go to ED. Push fluids as well as food.   Copied from CRM [id number]. Topic: General - Other >> Sep 07, 2023 11:30 AM [name] wrote: Reason for CRM: Daughter called [employer] needs to know if pt needs to go to ER pt not eating or drinkin. Reason for Disposition  [1] MODERATE weakness (i.e., interferes with work, school, normal activities) AND [2] cause unknown  (Exceptions: Weakness from acute minor illness or poor fluid intake; weakness is chronic and not worse.)  Answer Assessment - Initial Assessment Questions 1. DESCRIPTION: "Describe how you are feeling."     Head sore, weak, hiccups 2. SEVERITY: "How bad is it?"  "Can you stand and walk?"   - MILD (0-3): Feels weak or tired, but does not interfere with work, school or normal activities.   - MODERATE (4-7): Able to stand and walk; weakness interferes with work, school, or normal activities.   - SEVERE (8-10): Unable to stand or walk; unable to do usual activities.     Moderate 3. ONSET: "When did these symptoms begin?" (e.g., hours, days, weeks, months)     1 week 4. CAUSE: "What do you think is causing the  weakness or fatigue?" (e.g., not drinking enough fluids, medical problem, trouble sleeping)     UC recently dx with viral infection/URI, switched abx 5. NEW MEDICINES:  "Have you started on any new medicines recently?" (e.g., opioid pain medicines, benzodiazepines, muscle relaxants, antidepressants, antihistamines, neuroleptics, beta blockers)     Abx, most recent started-amoxicillin 6. OTHER SYMPTOMS: "Do you have any other symptoms?" (e.g., chest pain, fever, cough, SOB, vomiting, diarrhea, bleeding, other areas of pain)     Denies fever, + SOB same as when as UC,  Protocols used: Weakness (Generalized) and Fatigue-A-AH

## 2023-09-07 NOTE — ED Provider Triage Note (Signed)
Emergency Medicine Provider Triage Evaluation Note  [name] [name] , a 82 y.o. male  was evaluated in triage.  Pt complains of decreased appetite, nausea, sore throat.  Recently treated for possible strep throat, throat is improving but now has no appetite and is nauseous every time he takes any bites of food or tries to drink anything.  Family and patient are worried about dehydration.  Review of Systems  Positive: Sore throat, nausea Negative: Fever, vomiting  Physical Exam  BP 132/84 (BP Location: Left Arm)   Pulse 84   Temp 98 F (36.7 C) (Oral)   Resp 19   Ht 5\' 10"  (1.778 m)   Wt 104.3 kg   SpO2 99%   BMI 33.00 kg/m  Gen:   Awake, no distress   Resp:  Normal effort  MSK:   Moves extremities without difficulty  Other:    Medical Decision Making  Medically screening exam initiated at 9:07 PM.  Appropriate orders placed.  [name] [name] was informed that the remainder of the evaluation will be completed by another provider, this initial triage assessment does not replace that evaluation, and the importance of remaining in the ED until their evaluation is complete.     [name], [place] 09/07/23 2108

## 2023-09-08 ENCOUNTER — Ambulatory Visit: Payer: Self-pay | Admitting: Family Medicine

## 2023-09-08 ENCOUNTER — Encounter: Payer: Self-pay | Admitting: Family Medicine

## 2023-09-08 MED ORDER — ONDANSETRON 8 MG PO TBDP: 8.0000 mg | ORAL_TABLET | Freq: Three times a day (TID) | ORAL | 0 refills | Status: DC | PRN

## 2023-09-08 NOTE — Discharge Instructions (Signed)
Continue your antibiotics until they are finished.  Take the Zofran as needed for nausea.  Make sure to drink plenty of fluids, and try to stay well-hydrated.  Follow-up with your doctor to be rechecked

## 2023-09-08 NOTE — ED Notes (Signed)
Pt ambulated to restroom. 

## 2023-09-16 ENCOUNTER — Other Ambulatory Visit (HOSPITAL_COMMUNITY)
Admission: RE | Admit: 2023-09-16 | Discharge: 2023-09-16 | Disposition: A | Discharge status: Home / Self Care | Payer: PPO | Source: Ambulatory Visit | Attending: Family Medicine | Admitting: Family Medicine

## 2023-09-16 ENCOUNTER — Encounter: Payer: Self-pay | Admitting: Family Medicine

## 2023-09-16 ENCOUNTER — Ambulatory Visit: Payer: PPO | Admitting: Family Medicine

## 2023-09-16 ENCOUNTER — Telehealth: Payer: Self-pay

## 2023-09-16 ENCOUNTER — Other Ambulatory Visit: Payer: Self-pay

## 2023-09-16 VITALS — BP 102/67 | HR 67 | Ht 70.0 in | Wt 213.0 lb

## 2023-09-16 DIAGNOSIS — J029 Acute pharyngitis, unspecified: Secondary | ICD-10-CM

## 2023-09-16 DIAGNOSIS — R110 Nausea: Secondary | ICD-10-CM

## 2023-09-16 DIAGNOSIS — H8309 Labyrinthitis, unspecified ear: Secondary | ICD-10-CM | POA: Insufficient documentation

## 2023-09-16 DIAGNOSIS — R112 Nausea with vomiting, unspecified: Secondary | ICD-10-CM | POA: Diagnosis not present

## 2023-09-16 LAB — BASIC METABOLIC PANEL
Anion gap: 8 (ref 5–15)
BUN: 21 mg/dL (ref 8–23)
CO2: 25 mmol/L (ref 22–32)
Calcium: 9.4 mg/dL (ref 8.9–10.3)
Chloride: 104 mmol/L (ref 98–111)
Creatinine, Ser: 1.7 mg/dL — ABNORMAL HIGH (ref 0.61–1.24)
GFR, Estimated: 40 mL/min — ABNORMAL LOW (ref >60)
Glucose, Bld: 104 mg/dL — ABNORMAL HIGH (ref 70–99)
Potassium: 4.5 mmol/L (ref 3.5–5.1)
Sodium: 137 mmol/L (ref 135–145)

## 2023-09-16 LAB — CBC WITH DIFFERENTIAL/PLATELET
Abs Immature Granulocytes: 0.04 10*3/uL (ref 0.00–0.07)
Basophils Absolute: 0 10*3/uL (ref 0.0–0.1)
Basophils Relative: 0 %
Eosinophils Absolute: 0 10*3/uL (ref 0.0–0.5)
Eosinophils Relative: 1 %
HCT: 41.8 % (ref 39.0–52.0)
Hemoglobin: 13.7 g/dL (ref 13.0–17.0)
Immature Granulocytes: 1 %
Lymphocytes Relative: 15 %
Lymphs Abs: 1.2 10*3/uL (ref 0.7–4.0)
MCH: 29.5 pg (ref 26.0–34.0)
MCHC: 32.8 g/dL (ref 30.0–36.0)
MCV: 90.1 fL (ref 80.0–100.0)
Monocytes Absolute: 0.6 10*3/uL (ref 0.1–1.0)
Monocytes Relative: 7 %
Neutro Abs: 6.3 10*3/uL (ref 1.7–7.7)
Neutrophils Relative %: 76 %
Platelets: 286 10*3/uL (ref 150–400)
RBC: 4.64 MIL/uL (ref 4.22–5.81)
RDW: 14.6 % (ref 11.5–15.5)
WBC: 8.2 10*3/uL (ref 4.0–10.5)
nRBC: 0 % (ref 0.0–0.2)

## 2023-09-16 MED ORDER — ONDANSETRON HCL 4 MG/2ML IJ SOLN
4.0000 mg | Freq: Once | INTRAMUSCULAR | Status: AC
  Administered 2023-09-16: 4 mg via INTRAMUSCULAR

## 2023-09-16 MED ORDER — MECLIZINE HCL 50 MG PO TABS: ORAL_TABLET | ORAL | 0 refills | Status: DC

## 2023-09-16 MED ORDER — ONDANSETRON HCL 4 MG PO TABS: 4.0000 mg | ORAL_TABLET | Freq: Three times a day (TID) | ORAL | 0 refills | Status: DC | PRN

## 2023-09-16 NOTE — Telephone Encounter (Signed)
Copied from CRM [id number]. Topic: General - Other >> Sep 16, 2023 12:29 PM Alphonzo Lemmings O wrote: Reason for CRM: lab calling about patient . He is there to do some lab work but it has been discontinued. The lab is needing to speak with someone [phone]. Phones disconnected but patient is there and saying the Dr said he need his labs today but they had been discontinued and she mentioned about cbc and lipid and something else that can't be done together please give lab a call asap. Tried calling [facility] no one answered

## 2023-09-16 NOTE — Patient Instructions (Addendum)
F/U in 7 to 10 days, call or message with concerns  Zofran 4 mg IM in office, and prescribed also meclizine  for vertigo  Zofran and meclizine are prescribed  CBC and diff and chem 7 and EGFR, stat at hospital today

## 2023-09-21 ENCOUNTER — Other Ambulatory Visit: Payer: Self-pay | Admitting: Family Medicine

## 2023-09-21 DIAGNOSIS — N401 Enlarged prostate with lower urinary tract symptoms: Secondary | ICD-10-CM

## 2023-09-21 MED ORDER — BACLOFEN 10 MG PO TABS: ORAL_TABLET | ORAL | 0 refills | Status: DC

## 2023-09-23 ENCOUNTER — Ambulatory Visit: Payer: PPO | Admitting: Family Medicine

## 2023-09-23 ENCOUNTER — Other Ambulatory Visit (HOSPITAL_COMMUNITY): Payer: PPO

## 2023-09-23 DIAGNOSIS — R112 Nausea with vomiting, unspecified: Secondary | ICD-10-CM | POA: Insufficient documentation

## 2023-09-23 MED ORDER — MECLIZINE HCL 25 MG PO TABS: 25.0000 mg | ORAL_TABLET | Freq: Three times a day (TID) | ORAL | 0 refills | Status: DC | PRN

## 2023-09-23 NOTE — Progress Notes (Signed)
   [name] [name]     MRN: [id number]      DOB: May 14, 1941  Chief Complaint  Patient presents with   Follow-up    Follow up dizziness nausea, not able to eat losing weight    HPI [name] is here for follow up for acute illness which started 09/01/2023. He has been treated in the office and in the eD , still reports sigmificant dizziness, nausea, poor appetite , weakness.  Sore throat has improved, no documented fever, no cough. ROS See HPI  PE  BP 102/67 (BP Location: Right Arm, Patient Position: Sitting, Cuff Size: Normal)   Pulse 67   Ht 5\' 10"  (1.778 m)   Wt 213 lb (96.6 kg)   SpO2 93%   BMI 30.56 kg/m   Patient ill appearing , weight loss and ,mildly hypotensive  HEENT: No facial asymmetry, EOMI,      Chest: Clear to auscultation bilaterally.  CVS: S1, S2 no murmurs, no S3.Regular rate.  ABD: Soft non tender.   Ext: No edema  Skin: Intact, no ulcerations or rash noted.  CNS: CN 2-12 intact, power,  normal throughout.no focal deficits noted.   Assessment & Plan  Nausea and vomiting Zofran 4 mg iM in office , and script sent in , check labs today  Labyrinthitis, acute Acute vertigo ongoing , meclizine prescribed

## 2023-09-23 NOTE — Assessment & Plan Note (Signed)
Zofran 4 mg iM in office , and script sent in , check labs today

## 2023-09-23 NOTE — Assessment & Plan Note (Signed)
Acute vertigo ongoing , meclizine prescribed

## 2023-09-24 ENCOUNTER — Encounter: Payer: Self-pay | Admitting: Family Medicine

## 2023-09-24 ENCOUNTER — Ambulatory Visit (INDEPENDENT_AMBULATORY_CARE_PROVIDER_SITE_OTHER): Payer: PPO | Admitting: Family Medicine

## 2023-09-24 VITALS — BP 132/83 | HR 81 | Ht 70.0 in | Wt 209.1 lb

## 2023-09-24 DIAGNOSIS — R110 Nausea: Secondary | ICD-10-CM

## 2023-09-24 DIAGNOSIS — R42 Dizziness and giddiness: Secondary | ICD-10-CM

## 2023-09-24 NOTE — Patient Instructions (Addendum)
F/u in 6 weeks, call If you need me sooner  I am working om an urgent  ENT appointment for you, hopefully in the next 1 week, we will contact you with appt info as  soon as we get this  Please take the ,meclizine and zofran three times per day on a regular schedule since you become very symptomatic with any movement still  PLEASE Update DPR at checkout add names to [facility] your medical info can be discussed with   Thanks for choosing [facility], we consider it a privelige to serve you.

## 2023-09-25 ENCOUNTER — Telehealth (INDEPENDENT_AMBULATORY_CARE_PROVIDER_SITE_OTHER): Payer: Self-pay

## 2023-09-25 DIAGNOSIS — R110 Nausea: Secondary | ICD-10-CM | POA: Insufficient documentation

## 2023-09-25 DIAGNOSIS — R42 Dizziness and giddiness: Secondary | ICD-10-CM | POA: Insufficient documentation

## 2023-09-25 NOTE — Progress Notes (Signed)
   [name] [name]     MRN: [id number]      DOB: 05-02-1941  Chief Complaint  Patient presents with   Follow-up    Follow up still dizzy nauseous hiccups seem to be better    HPI [name] [name] is here for follow up of severe vertigo and nausea causing significant debility and with a 21 pound weight loss recorded  in past 3 weeks. Symptoms may be 30 % improved,  hiccups are less, but any mopvement causes debilitating nausea and vertigo He is eating increased amounts of food  of varying types , however since last visit , weight is again decreased  Has been on z pack for acute pharyngitis since symptom onset , and was also evaluated in the ED This is his 4th presentation for clinical evaluation ROS Denies recent fever or chills.  Denies chest congestion, productive cough or wheezing.  PE  BP 132/83 (BP Location: Right Arm, Patient Position: Sitting, Cuff Size: Normal)   Pulse 81   Ht 5\' 10"  (1.778 m)   Wt 209 lb 1.3 oz (94.8 kg)   SpO2 90%   BMI 30.00 kg/m   Patient alert and oriented and in no cardiopulmonary distress.Appears improved since last visit  HEENT: No facial asymmetry, EOMI,     Neck supple .   MS: Adequate ROM spine, shoulders, hips and knees.  Skin: Intact, no ulcerations or rash noted.  Psych: Good eye contact, normal affect. Memory intact not anxious or depressed appearing.  CNS: CN 2-12 intact, power,  normal throughout.no focal deficits noted.   Assessment & Plan  Vertigo Persistent vertigo and n ausea x 3 weeks with weight loss , urgent eNT eval, opt  to resume meclizine and zofran on schedule  Nausea Still experiencing nausea associated with vertigo, recommend scheduled use of zofran

## 2023-09-25 NOTE — Assessment & Plan Note (Signed)
Still experiencing nausea associated with vertigo, recommend scheduled use of zofran

## 2023-09-25 NOTE — Assessment & Plan Note (Signed)
Persistent vertigo and n ausea x 3 weeks with weight loss , urgent eNT eval, opt  to resume meclizine and zofran on schedule

## 2023-09-25 NOTE — Telephone Encounter (Signed)
Called and speak with [name] [name] she had left a message and wanted to speak about his care with patel.no one answered my call please send to me if they call.

## 2023-09-27 ENCOUNTER — Other Ambulatory Visit: Payer: Self-pay | Admitting: Family Medicine

## 2023-09-27 DIAGNOSIS — N401 Enlarged prostate with lower urinary tract symptoms: Secondary | ICD-10-CM

## 2023-09-28 ENCOUNTER — Other Ambulatory Visit: Payer: Self-pay

## 2023-09-28 MED ORDER — ONDANSETRON HCL 4 MG PO TABS: 4.0000 mg | ORAL_TABLET | Freq: Three times a day (TID) | ORAL | 0 refills | Status: DC | PRN

## 2023-09-29 ENCOUNTER — Other Ambulatory Visit: Payer: Self-pay | Admitting: Family Medicine

## 2023-09-29 MED ORDER — ONDANSETRON HCL 4 MG PO TABS: 4.0000 mg | ORAL_TABLET | Freq: Three times a day (TID) | ORAL | 0 refills | Status: AC | PRN

## 2023-09-29 MED ORDER — MECLIZINE HCL 25 MG PO TABS: 25.0000 mg | ORAL_TABLET | Freq: Three times a day (TID) | ORAL | 0 refills | Status: DC | PRN

## 2023-09-30 ENCOUNTER — Ambulatory Visit (HOSPITAL_COMMUNITY): Payer: PPO

## 2023-10-01 ENCOUNTER — Ambulatory Visit: Payer: Self-pay | Admitting: Family Medicine

## 2023-10-01 NOTE — Telephone Encounter (Signed)
   Chief Complaint: sore throat Symptoms: sore throat, occasional cough Frequency: x this morning Pertinent Negatives: Patient denies fever, rash, headache Disposition: [] ED /[] Urgent Care (no appt availability in office) / [] Appointment(In office/virtual)/ []  [facility]/ [x] Home Care/ [] Refused Recommended Disposition /[] [facility]/ []  Follow-up with PCP Additional Notes: Patient called in and reports he has had a mild sore throat since drinking hot coffee this morning. Patient reports it feels like his throat is raw, but that he is still able to eat and drink okay. Patient is currently taking medication for acid reflux and is scheduled with an ENT for follow-up so believes this may also be related. Per protocol, this RN advised home care and told patient to call back if symptoms worsen. Patient verbalized understanding.    Copied from CRM [id number]. Topic: Clinical - Red Word Triage >> Oct 01, 2023  2:29 PM Prudencio Pair wrote: Red Word that prompted transfer to Nurse Triage: Patient's wife, Steward Drone, states patient said his throat has a raw feeling and burning sensation when he swallowed coffee this morning. She states pt wants to know if any of these three meds that he has been on for a while, could be the cause. Meds are baclofen, meclizine hcl, and ondansetron 4mg . Reason for Disposition  [1] Sore throat with cough/cold symptoms AND [2] present < 5 days  Answer Assessment - Initial Assessment Questions 1. ONSET: "When did the throat start hurting?" (Hours or days ago)      This morning 2. SEVERITY: "How bad is the sore throat?" (Scale 1-10; mild, moderate or severe)   - MILD (1-3):  Doesn't interfere with eating or normal activities.   - MODERATE (4-7): Interferes with eating some solids and normal activities.   - SEVERE (8-10):  Excruciating pain, interferes with most normal activities.   - SEVERE WITH DYSPHAGIA (10): Can't swallow liquids, drooling.     2/10 3.  STREP EXPOSURE: "Has there been any exposure to strep within the past week?" If Yes, ask: "What type of contact occurred?"      no 4.  VIRAL SYMPTOMS: "Are there any symptoms of a cold, such as a runny nose, cough, hoarse voice or red eyes?"      cough 5. FEVER: "Do you have a fever?" If Yes, ask: "What is your temperature, how was it measured, and when did it start?"     no 6. PUS ON THE TONSILS: "Is there pus on the tonsils in the back of your throat?"     no 7. OTHER SYMPTOMS: "Do you have any other symptoms?" (e.g., difficulty breathing, headache, rash)     Feels raw down to stomach, cough, reflux  Protocols used: Sore Throat-A-AH

## 2023-10-02 ENCOUNTER — Telehealth: Payer: Self-pay | Admitting: Internal Medicine

## 2023-10-02 ENCOUNTER — Other Ambulatory Visit: Payer: Self-pay | Admitting: Family Medicine

## 2023-10-02 DIAGNOSIS — R110 Nausea: Secondary | ICD-10-CM

## 2023-10-02 DIAGNOSIS — R1310 Dysphagia, unspecified: Secondary | ICD-10-CM

## 2023-10-02 DIAGNOSIS — K219 Gastro-esophageal reflux disease without esophagitis: Secondary | ICD-10-CM

## 2023-10-02 MED ORDER — PANTOPRAZOLE SODIUM 40 MG PO TBEC: 40.0000 mg | DELAYED_RELEASE_TABLET | Freq: Every day | ORAL | 1 refills | Status: DC

## 2023-10-02 NOTE — Telephone Encounter (Signed)
Patient's PCP reached out to me.  Can we see if we get him in for new patient evaluation with me next week?  Diagnosis nausea vomiting GERD.  Thank you!

## 2023-10-07 ENCOUNTER — Ambulatory Visit (INDEPENDENT_AMBULATORY_CARE_PROVIDER_SITE_OTHER): Payer: PPO | Admitting: Internal Medicine

## 2023-10-07 VITALS — BP 108/72 | HR 60 | Temp 97.5°F | Ht 70.0 in | Wt 208.8 lb

## 2023-10-07 DIAGNOSIS — R634 Abnormal weight loss: Secondary | ICD-10-CM | POA: Diagnosis not present

## 2023-10-07 DIAGNOSIS — R1310 Dysphagia, unspecified: Secondary | ICD-10-CM | POA: Diagnosis not present

## 2023-10-07 DIAGNOSIS — R110 Nausea: Secondary | ICD-10-CM | POA: Diagnosis not present

## 2023-10-07 DIAGNOSIS — K219 Gastro-esophageal reflux disease without esophagitis: Secondary | ICD-10-CM

## 2023-10-07 MED ORDER — PANTOPRAZOLE SODIUM 40 MG PO TBEC: 40.0000 mg | DELAYED_RELEASE_TABLET | Freq: Two times a day (BID) | ORAL | 11 refills | Status: AC

## 2023-10-07 NOTE — Patient Instructions (Signed)
I am going to increase your pantoprazole to 40 mg twice daily.  This medication works best if you take at least 30 minutes before breakfast.  The second dose you can take 30 minutes before dinner or at bedtime.  Follow-up in 6 to 8 weeks.  If not improved will consider upper endoscopy.  If your symptoms worsen in the meantime then let me know and we can set this up over the phone.  It was very nice meeting both you today.  Dr. Marletta Lor

## 2023-10-07 NOTE — Progress Notes (Signed)
Primary Care Physician:  Kerri Perches, MD Primary Gastroenterologist:  Dr. Marletta Lor  Chief Complaint  Patient presents with   Gastroesophageal Reflux    Patient arrives with daughter. Referred for GERD. Having a lot of belching. Started about the first week of November. Having painful swallowing and nausea. He thinks nausea is coming from motion sickness.     HPI:   [name] [name] is a 82 y.o. male who presents to clinic today for from his PCP Dr. Lodema Hong for evaluation.  Patient states he was in his normal state of health until early November when he had what sounds like pharyngitis, likely viral.  Strep negative.  Following that he had very painful swallowing/odynophagia.  States he could not even tolerate water.  Also notes associated nausea which she believes is due to motion sickness.  He has evaluation by ENT upcoming for possible BPPV.  Does also report some acid reflux, describes it as a burning in his chest.  Occurs after belching.  No previous upper endoscopy.  He was started on pantoprazole 40 mg daily 5 days ago.  States since that time, his symptoms have drastically improved.  Swallowing no longer painful.  Reflux symptoms decreased significantly.  No melena hematochezia.  Past Medical History:  Diagnosis Date   Anemia, iron deficiency    Annual physical exam 09/01/2016   Cancer (HCC)    skin cancers    Educated about COVID-19 virus infection 03/13/2019   ELECTROCARDIOGRAM, ABNORMAL 08/18/2010   Qualifier: Diagnosis of  By: Lodema Hong MD, Margaret     GERD (gastroesophageal reflux disease)    H/O hiatal hernia    Headache    Hemorrhoids    and minimal polp   History of kidney stones    History of migraines childhood    Flared up in 2009, and have responded to depakote which was stopped in 9 months   Hx of colonic polyps 02/24/2007   Hyperlipidemia 2003   Hypertension 2003   Low sodium levels 02/20/2017   LUNG NODULE 08/18/2010   Qualifier: Diagnosis of  By:  Lodema Hong MD, Margaret  No need for radiologic f/u unless new symptoms develop    Lung nodules since 2009   CT scan advised in 08/2010   Neoplasm    dermal   Nonspecific abnormal findings on radiological and other examination of skull and head 08/18/2010   Qualifier: Diagnosis of  By: Lodema Hong MD, Margaret  Scan of 08/2010 showed no change over a 2 yr period, no need for further scanning.Pt is a non smoker, will not rept unless symptomatic    Overweight(278.02)     Past Surgical History:  Procedure Laterality Date   COLONOSCOPY  2008   Fields-mod IH, 3mm polyp sigmoid colon (disappeared w/ insufflation & could not be retrieved)   COLONOSCOPY  05/19/2012   Procedure: COLONOSCOPY;  Surgeon: West Bali, MD;  Location: [facility];  Service: Endoscopy;  Laterality: N/A;  8:30   CYSTOSCOPY W/ URETERAL STENT PLACEMENT Left 11/27/2016   Procedure: CYSTOSCOPY WITH RETROGRADE PYELOGRAM/URETERAL STENT PLACEMENT;  Surgeon: Alfredo Martinez, MD;  Location: [facility];  Service: Urology;  Laterality: Left;   ESOPHAGOGASTRODUODENOSCOPY  01/05/2007   Fields-sm HH, benign SB biopsy   EXTRACORPOREAL SHOCK WAVE LITHOTRIPSY Left 12/08/2016   Procedure: LEFT EXTRACORPOREAL SHOCK WAVE LITHOTRIPSY (ESWL);  Surgeon: Jerilee Field, MD;  Location: [facility];  Service: Urology;  Laterality: Left;   GIVENS CAPSULE STUDY  01/20/2007   Fields-nothing to explain IDA, lymphectasias  LEFT HEART CATH AND CORONARY ANGIOGRAPHY N/A 06/15/2020   Procedure: LEFT HEART CATH AND CORONARY ANGIOGRAPHY;  Surgeon: [place], Peter M, MD;  Location: [facility] INVASIVE CV LAB;  Service: Cardiovascular;  Laterality: N/A;   removal of skin cancer twice      Current Outpatient Medications  Medication Sig Dispense Refill   acetaminophen (TYLENOL) 325 MG tablet Take 650 mg by mouth as needed for mild pain.      colchicine 0.6 MG tablet 1 capsule Orally every day to prevent gout flares for 30 days     famotidine (PEPCID) 40 MG tablet Take 1 tablet by  mouth once daily 90 tablet 0   finasteride (PROSCAR) 5 MG tablet Take 1 tablet by mouth once daily 90 tablet 0   Krill Oil 300 MG CAPS Take 300 mg by mouth at bedtime.      loratadine (CLARITIN) 10 MG tablet Take 10 mg by mouth daily.     meclizine (ANTIVERT) 25 MG tablet Take 1 tablet (25 mg total) by mouth 3 (three) times daily as needed for dizziness. 30 tablet 0   Misc Natural Products (BLACK CHERRY CONCENTRATE PO) Take 1 Dose by mouth daily.      Multiple Vitamin (MULTIVITAMIN WITH MINERALS) TABS Take 1 tablet by mouth daily.     nitroGLYCERIN (NITROSTAT) 0.4 MG SL tablet Take 1 tab under your tongue while sitting If no relief of chest pain may repeat one tab every 5 minutes up to 3 tablets total over 15 minutes (Patient taking differently: Place 0.4 mg under the tongue every 5 (five) minutes as needed for chest pain. up to 3 tablets total over 15 minutes) 25 tablet 3   ondansetron (ZOFRAN) 4 MG tablet Take 1 tablet (4 mg total) by mouth every 8 (eight) hours as needed for nausea or vomiting. 30 tablet 0   pantoprazole (PROTONIX) 40 MG tablet Take 1 tablet (40 mg total) by mouth daily. 30 tablet 1   potassium chloride (KLOR-CON M) 10 MEQ tablet Take 1 tablet (10 mEq total) by mouth daily. 90 tablet 3   rosuvastatin (CRESTOR) 10 MG tablet Take 1 tablet (10 mg total) by mouth daily. 90 tablet 3   terazosin (HYTRIN) 2 MG capsule Take 1 capsule by mouth at bedtime 90 capsule 0   baclofen (LIORESAL) 10 MG tablet Take one half to one tablet by mouth every 8 hours for uncontrolled hiccups (Patient not taking: Reported on 10/07/2023) 21 each 0   magic mouthwash (nystatin, lidocaine, diphenhydrAMINE, alum & mag hydroxide) suspension Swish and swallow 5 mLs 3 (three) times daily as needed for mouth pain. (Patient not taking: Reported on 10/07/2023) 180 mL 0   metoprolol tartrate (LOPRESSOR) 100 MG tablet Take 1 tablet by mouth twice daily (Patient not taking: Reported on 10/07/2023) 180 tablet 0    ondansetron (ZOFRAN-ODT) 8 MG disintegrating tablet Take 1 tablet (8 mg total) by mouth every 8 (eight) hours as needed for nausea or vomiting. (Patient not taking: Reported on 10/07/2023) 12 tablet 0   promethazine-dextromethorphan (PROMETHAZINE-DM) 6.25-15 MG/5ML syrup Take 5 mLs by mouth 4 (four) times daily as needed. (Patient not taking: Reported on 10/07/2023) 118 mL 0   No current facility-administered medications for this visit.    Allergies as of 10/07/2023 - Review Complete 10/07/2023  Allergen Reaction Noted   Ace inhibitors Cough 02/09/2012    Family History  Problem Relation Age of Onset   Heart defect Mother        pacemaker  Alzheimer's disease Mother 70       2011   Heart defect Brother        bradycardia    Heart failure Father    Asthma Father    Hypertension Daughter    Kidney Stones Daughter    Colon cancer Neg Hx     Social History   Socioeconomic History   Marital status: Married    Spouse name: brenda    Number of children: 1   Years of education: 10   Highest education level: 10th grade  Occupational History   Occupation: retired, Marketing executive: SELF EMPLOYED  Tobacco Use   Smoking status: Former    Current packs/day: 0.00    Average packs/day: 2.0 packs/day for 15.0 years (30.0 ttl pk-yrs)    Types: Cigarettes    Start date: 10/21/1963    Quit date: 10/20/1978    Years since quitting: 44.9   Smokeless tobacco: Former    Types: Chew    Quit date: 10/20/2004  Vaping Use   Vaping status: Never Used  Substance and Sexual Activity   Alcohol use: No   Drug use: No   Sexual activity: Yes  Other Topics Concern   Not on file  Social History Narrative   1 healthy daughter   Lives alone with wife and dog    Social Drivers of Health   Financial Resource Strain: Low Risk  (10/06/2022)   Overall Financial Resource Strain (CARDIA)    Difficulty of Paying Living Expenses: Not hard at all  Food Insecurity: No Food Insecurity  (10/06/2022)   Hunger Vital Sign    Worried About Running Out of Food in the Last Year: Never true    Ran Out of Food in the Last Year: Never true  Transportation Needs: No Transportation Needs (05/14/2023)   PRAPARE - Administrator, Civil Service (Medical): No    Lack of Transportation (Non-Medical): No  Physical Activity: Insufficiently Active (10/06/2022)   Exercise Vital Sign    Days of Exercise per Week: 3 days    Minutes of Exercise per Session: 30 min  Stress: No Stress Concern Present (10/06/2022)   [employer] of Occupational Health - Occupational Stress Questionnaire    Feeling of Stress : Not at all  Social Connections: Socially Integrated (10/06/2022)   Social Connection and Isolation Panel [NHANES]    Frequency of Communication with Friends and Family: More than three times a week    Frequency of Social Gatherings with Friends and Family: More than three times a week    Attends Religious Services: More than 4 times per year    Active Member of [employer] or Organizations: Yes    Attends Engineer, structural: More than 4 times per year    Marital Status: Married  Catering manager Violence: Not At Risk (10/06/2022)   Humiliation, Afraid, Rape, and Kick questionnaire    Fear of Current or Ex-Partner: No    Emotionally Abused: No    Physically Abused: No    Sexually Abused: No    Subjective: Review of Systems  Constitutional:  Negative for chills and fever.  HENT:  Negative for congestion and hearing loss.   Eyes:  Negative for blurred vision and double vision.  Respiratory:  Negative for cough and shortness of breath.   Cardiovascular:  Negative for chest pain and palpitations.  Gastrointestinal:  Positive for heartburn and nausea. Negative for abdominal pain, blood in stool, constipation, diarrhea, melena and  vomiting.       Odynophagia  Genitourinary:  Negative for dysuria and urgency.  Musculoskeletal:  Negative for joint pain and myalgias.   Skin:  Negative for itching and rash.  Neurological:  Negative for dizziness and headaches.  Psychiatric/Behavioral:  Negative for depression. The patient is not nervous/anxious.        Objective: There were no vitals taken for this visit. Physical Exam Constitutional:      Appearance: Normal appearance.  HENT:     Head: Normocephalic and atraumatic.  Eyes:     Extraocular Movements: Extraocular movements intact.     Conjunctiva/sclera: Conjunctivae normal.  Cardiovascular:     Rate and Rhythm: Normal rate and regular rhythm.  Pulmonary:     Effort: Pulmonary effort is normal.     Breath sounds: Normal breath sounds.  Abdominal:     General: Bowel sounds are normal.     Palpations: Abdomen is soft.  Musculoskeletal:        General: Normal range of motion.     Cervical back: Normal range of motion and neck supple.  Skin:    General: Skin is warm.  Neurological:     General: No focal deficit present.     Mental Status: He is alert and oriented to person, place, and time.  Psychiatric:        Mood and Affect: Mood normal.        Behavior: Behavior normal.      Assessment: *Chronic GERD *Odynophagia *Nausea *Weight loss  Plan: Patient reports vast improvement in his symptoms after only x 5 days of pantoprazole.  Discussed scheduling for upper endoscopy now versus trial of medication for 6 to 8 weeks and he would like to try medication first given his recent improvement.  Will increase pantoprazole to twice daily.  Follow-up in 6 to 8 weeks, if not improved we will schedule upper endoscopy at that time.  Patient to call office if worsens in the meantime and we can set this up more urgently.  Thank you Dr. Lodema Hong for the kind referral  10/07/2023 9:20 AM   Disclaimer: This note was dictated with voice recognition software. Similar sounding words can inadvertently be transcribed and may not be corrected upon review.

## 2023-10-08 ENCOUNTER — Encounter: Payer: Self-pay | Admitting: Internal Medicine

## 2023-10-18 ENCOUNTER — Other Ambulatory Visit: Payer: Self-pay | Admitting: Family Medicine

## 2023-10-20 ENCOUNTER — Encounter (INDEPENDENT_AMBULATORY_CARE_PROVIDER_SITE_OTHER): Payer: Self-pay

## 2023-10-20 ENCOUNTER — Ambulatory Visit (INDEPENDENT_AMBULATORY_CARE_PROVIDER_SITE_OTHER): Payer: PPO | Admitting: Otolaryngology

## 2023-10-20 VITALS — BP 131/79 | HR 56 | Ht 69.0 in | Wt 208.0 lb

## 2023-10-20 DIAGNOSIS — H8120 Vestibular neuronitis, unspecified ear: Secondary | ICD-10-CM | POA: Diagnosis not present

## 2023-10-20 DIAGNOSIS — R42 Dizziness and giddiness: Secondary | ICD-10-CM

## 2023-10-20 NOTE — Progress Notes (Signed)
 Dear Dr. Antonetta, Here is my assessment for our mutual patient, [name] [name]. Thank you for allowing me the opportunity to care for your patient. Please do not hesitate to contact me should you have any other questions. Sincerely, [name] Blanch, MD  Otolaryngology Clinic Note Referring provider: Dr. Antonetta HPI:  [name] [name] is a 82 y.o. male kindly referred by Dr. Antonetta   Initial visit (12/224): The patient presents today for evaluation of vertigo. He reports that around 5-6 weeks ago he started to develop sore throat and upper respiratory infection, and got antibiotics. No improvement in symptoms. Of note, he notes along with the sore throat he had difficulty swallowing and was unable to tolerate solids or liquids. At this point, after the URI, he reports he started to have vertigo acute onset associated with nausea. Difficult to keep food down due to the N/V. Vertigo is described as constant, unremitting, worse with head movement and it lasted weeks. He reports basically being in bed for 2 weeks due to this and feeling off imbalance. He took meclizine  and it helped some. No decline in hearing, tinnitus, or other otologic complaints such as ear fullness, drainage. He has since noted slow improvement and now feels back to normal. No nausea. He denied any associated neurologic symptoms including numbness tingling weakness, denied any headache, no visual changes.  He denied any preceding trauma to the head. He notes this started as a kid, he was unable to ride in a car through the mountains without becoming severely nauseous and dizzy.  He also notes any sort of quick movements such as carnival rides caused episodes of severe dizziness and nausea.  The symptoms were brief for few seconds, never lasting longer than a day, and he has not had them for several years. He has not taken any meclizine  or medications for vertigo over last 2 weeks  No recent audiograms  H&N Surgery: None  PMHx: HTN,  Aortic dilation, BPH, CKD, HLD  Independent Review of Additional Tests or Records:  Dr. JONELLE [name] (09/01/2023): Acute pharyngitis, Rx azithromycin  ED 09/07/2023 Dr. Randol: Cough/congestion/URI. Not improved on azithromycin , vesicles in OP c/f viral pharyngitis; Rx: continue abx, supportive care Dr. Antonetta (09/16/2023): Noted significant dizziness, Nausea, weakness, sore throat improved; No neuro deficitis; Dx: labyrinthitis; Rx: meclizine  and zofran  Dr. Antonetta (09/24/2023): Sx 30% improved, movement causes vertigo and nausea; eating more; on z-pack; Labyrinthitis - Rx: ENT eval, meclizine , zofran  Dr. Cindie (10/07/2023): GI visit -- some acid reflux; on PPI; improvement afterwards Dx: GERD; Rx: continue PPI, f/u 6-8 weeks CBC 09/16/2023: wnl BMP 09/16/2023: no significant EL abnormalities PMH/Meds/All/SocHx/FamHx/ROS:   Past Medical History:  Diagnosis Date   Anemia, iron deficiency    Annual physical exam 09/01/2016   Cancer (HCC)    skin cancers    Educated about COVID-19 virus infection 03/13/2019   ELECTROCARDIOGRAM, ABNORMAL 08/18/2010   Qualifier: Diagnosis of  By: Antonetta MD, Margaret     GERD (gastroesophageal reflux disease)    H/O hiatal hernia    Headache    Hemorrhoids    and minimal polp   History of kidney stones    History of migraines childhood    Flared up in 2009, and have responded to depakote which was stopped in 9 months   Hx of colonic polyps 02/24/2007   Hyperlipidemia 2003   Hypertension 2003   Low sodium levels 02/20/2017   LUNG NODULE 08/18/2010   Qualifier: Diagnosis of  By: Antonetta MD, Margaret  No need for radiologic  f/u unless new symptoms develop    Lung nodules since 2009   CT scan advised in 08/2010   Neoplasm    dermal   Nonspecific abnormal findings on radiological and other examination of skull and head 08/18/2010   Qualifier: Diagnosis of  By: Antonetta MD, Rollene Cluster of 08/2010 showed no change over a 2 yr period, no need for further  scanning.Pt is a non smoker, will not rept unless symptomatic    Overweight(278.02)      Past Surgical History:  Procedure Laterality Date   COLONOSCOPY  2008   Fields-mod IH, 3mm polyp sigmoid colon (disappeared w/ insufflation & could not be retrieved)   COLONOSCOPY  05/19/2012   Procedure: COLONOSCOPY;  Surgeon: Margo LITTIE Haddock, MD;  Location: [facility];  Service: Endoscopy;  Laterality: N/A;  8:30   CYSTOSCOPY W/ URETERAL STENT PLACEMENT Left 11/27/2016   Procedure: CYSTOSCOPY WITH RETROGRADE PYELOGRAM/URETERAL STENT PLACEMENT;  Surgeon: Glendia Elizabeth, MD;  Location: [facility];  Service: Urology;  Laterality: Left;   ESOPHAGOGASTRODUODENOSCOPY  01/05/2007   Fields-sm HH, benign SB biopsy   EXTRACORPOREAL SHOCK WAVE LITHOTRIPSY Left 12/08/2016   Procedure: LEFT EXTRACORPOREAL SHOCK WAVE LITHOTRIPSY (ESWL);  Surgeon: Donnice Brooks, MD;  Location: [facility];  Service: Urology;  Laterality: Left;   GIVENS CAPSULE STUDY  01/20/2007   Fields-nothing to explain IDA, lymphectasias   LEFT HEART CATH AND CORONARY ANGIOGRAPHY N/A 06/15/2020   Procedure: LEFT HEART CATH AND CORONARY ANGIOGRAPHY;  Surgeon: Jordan, Peter M, MD;  Location: [facility] INVASIVE CV LAB;  Service: Cardiovascular;  Laterality: N/A;   removal of skin cancer twice      Family History  Problem Relation Age of Onset   Heart defect Mother        pacemaker    Alzheimer's disease Mother 17       2011   Heart defect Brother        bradycardia    Heart failure Father    Asthma Father    Hypertension Daughter    Kidney Stones Daughter    Colon cancer Neg Hx      Social Connections: Socially Integrated (10/06/2022)   Social Connection and Isolation Panel [NHANES]    Frequency of Communication with Friends and Family: More than three times a week    Frequency of Social Gatherings with Friends and Family: More than three times a week    Attends Religious Services: More than 4 times per year    Active Member of [employer] or  Organizations: Yes    Attends Engineer, Structural: More than 4 times per year    Marital Status: Married      Current Outpatient Medications:    acetaminophen  (TYLENOL ) 325 MG tablet, Take 650 mg by mouth as needed for mild pain. , Disp: , Rfl:    allopurinol  (ZYLOPRIM ) 300 MG tablet, Take 300 mg by mouth daily., Disp: , Rfl:    amLODipine  (NORVASC ) 2.5 MG tablet, Take 1 tablet by mouth once daily, Disp: 30 tablet, Rfl: 0   colchicine 0.6 MG tablet, 1 capsule Orally every day to prevent gout flares for 30 days, Disp: , Rfl:    famotidine  (PEPCID ) 40 MG tablet, Take 1 tablet by mouth once daily, Disp: 90 tablet, Rfl: 0   finasteride  (PROSCAR ) 5 MG tablet, Take 1 tablet by mouth once daily, Disp: 90 tablet, Rfl: 0   Krill Oil 300 MG CAPS, Take 300 mg by mouth at bedtime. , Disp: , Rfl:  loratadine  (CLARITIN ) 10 MG tablet, Take 10 mg by mouth daily., Disp: , Rfl:    meclizine  (ANTIVERT ) 25 MG tablet, Take 1 tablet (25 mg total) by mouth 3 (three) times daily as needed for dizziness., Disp: 30 tablet, Rfl: 0   Misc Natural Products (BLACK CHERRY CONCENTRATE PO), Take 1 Dose by mouth daily. , Disp: , Rfl:    Multiple Vitamin (MULTIVITAMIN WITH MINERALS) TABS, Take 1 tablet by mouth daily., Disp: , Rfl:    nitroGLYCERIN  (NITROSTAT ) 0.4 MG SL tablet, Take 1 tab under your tongue while sitting If no relief of chest pain may repeat one tab every 5 minutes up to 3 tablets total over 15 minutes (Patient taking differently: Place 0.4 mg under the tongue every 5 (five) minutes as needed for chest pain. up to 3 tablets total over 15 minutes), Disp: 25 tablet, Rfl: 3   ondansetron  (ZOFRAN ) 4 MG tablet, Take 1 tablet (4 mg total) by mouth every 8 (eight) hours as needed for nausea or vomiting., Disp: 30 tablet, Rfl: 0   pantoprazole  (PROTONIX ) 40 MG tablet, Take 1 tablet (40 mg total) by mouth 2 (two) times daily., Disp: 60 tablet, Rfl: 11   potassium chloride  (KLOR-CON  M) 10 MEQ tablet, Take 1  tablet (10 mEq total) by mouth daily., Disp: 90 tablet, Rfl: 3   rosuvastatin  (CRESTOR ) 10 MG tablet, Take 1 tablet (10 mg total) by mouth daily., Disp: 90 tablet, Rfl: 3   terazosin  (HYTRIN ) 2 MG capsule, Take 1 capsule by mouth at bedtime, Disp: 90 capsule, Rfl: 0   Physical Exam:   BP 131/79 (BP Location: Left Arm, Patient Position: Sitting, Cuff Size: Normal)   Pulse (!) 56   Ht 5' 9 (1.753 m)   Wt 208 lb (94.3 kg)   SpO2 92%   BMI 30.72 kg/m   Pertinent Findings  CN II-XII intact Head impulse test negative, no nystagmus, negative romberg Bilateral EAC clear and TM intact with well pneumatized middle ear spaces Weber 512: equal Rinne 512: AC > BC b/l  Anterior rhinoscopy: Septum right deviation; bilateral inferior turbinates with no hypertrophy No lesions of oral cavity/oropharynx; dentition with several missing teeth, minimal tonsillar tissue, no erythema, no lesions, uvula midline and rises with phonation No obviously palpable neck masses/lymphadenopathy/thyromegaly No respiratory distress or stridor  Seprately Identifiable Procedures:  None  Impression & Plans:  Acey Woodfield is a 82 y.o. male with:  1. Dizziness   2. Vestibular neuronitis, unspecified laterality    History and Px most consistent with vestibular neuritis. Appears to been  well cared for by Dr. Antonetta acutely. No other otologic symptoms. We discussed possibility of meniere's but don't think presentation would be consistent with this. Do not suspect OM or Schwannoma. He is doing much better, and  has not had symptoms in 10-14d.  We discussed options: Observation Further vestibular testing, hearing testing or rehab MRI After discussion, given he is now back to baseline, opted for observation and forego testing. Certainly try to avoid meclizine  now which he is doing  - f/u as needed should things decline or not improve   Thank you for allowing me the opportunity to care for your patient. Please do not  hesitate to contact me should you have any other questions.  Sincerely, [name] Blanch, mD [facility] Phone: [phone] Fax: [phone]  10/24/2023, 10:58 AM   MDM:  Level 3: [phone] Complexity/Problems addressed: low - acute problem, improved Data complexity: mod - review of notes and labs -  Morbidity: low  - Prescription Drug prescribed or managed: no

## 2023-11-09 ENCOUNTER — Other Ambulatory Visit: Payer: Self-pay | Admitting: Family Medicine

## 2023-11-12 ENCOUNTER — Ambulatory Visit (INDEPENDENT_AMBULATORY_CARE_PROVIDER_SITE_OTHER): Payer: PPO | Admitting: Family Medicine

## 2023-11-12 ENCOUNTER — Encounter: Payer: Self-pay | Admitting: Family Medicine

## 2023-11-12 VITALS — BP 131/80 | HR 73 | Ht 69.0 in | Wt 218.2 lb

## 2023-11-12 DIAGNOSIS — H8309 Labyrinthitis, unspecified ear: Secondary | ICD-10-CM

## 2023-11-12 DIAGNOSIS — I10 Essential (primary) hypertension: Secondary | ICD-10-CM | POA: Diagnosis not present

## 2023-11-12 DIAGNOSIS — E66811 Obesity, class 1: Secondary | ICD-10-CM | POA: Diagnosis not present

## 2023-11-12 DIAGNOSIS — E782 Mixed hyperlipidemia: Secondary | ICD-10-CM | POA: Diagnosis not present

## 2023-11-12 DIAGNOSIS — K219 Gastro-esophageal reflux disease without esophagitis: Secondary | ICD-10-CM | POA: Diagnosis not present

## 2023-11-12 DIAGNOSIS — M1A9XX1 Chronic gout, unspecified, with tophus (tophi): Secondary | ICD-10-CM | POA: Diagnosis not present

## 2023-11-12 NOTE — Assessment & Plan Note (Signed)
Resolved will f/u with ENT as needed

## 2023-11-12 NOTE — Assessment & Plan Note (Signed)
Symptomatic on max dose protonix, encouraged to d/c all caffeine and keep weight down, f/u with GI

## 2023-11-12 NOTE — Assessment & Plan Note (Signed)
Controlled, no change in medication  

## 2023-11-12 NOTE — Assessment & Plan Note (Signed)
  Patient re-educated about  the importance of commitment to a  minimum of 150 minutes of exercise per week as able.  The importance of healthy food choices with portion control discussed, as well as eating regularly and within a 12 hour window most days. The need to choose "clean , green" food 50 to 75% of the time is discussed, as well as to make water the primary drink and set a goal of 64 ounces water daily.       11/12/2023    1:17 PM 10/20/2023   10:53 AM 10/07/2023    9:20 AM  Weight /BMI  Weight 218 lb 3.2 oz 208 lb 208 lb 12.8 oz  Height 5\' 9"  (1.753 m) 5\' 9"  (1.753 m) 5\' 10"  (1.778 m)  BMI 32.22 kg/m2 30.72 kg/m2 29.96 kg/m2

## 2023-11-12 NOTE — Assessment & Plan Note (Signed)
No recent flare , check uric acid level , on allopiurinol

## 2023-11-12 NOTE — Progress Notes (Signed)
[name] [name]     MRN: [id number]      DOB: November 19, 1940  Chief Complaint  Patient presents with   Care Management    6 week f/u, reports sx of reflux has hiccups at times.     HPI [name] [name] is here for follow up and re-evaluation of chronic medical conditions, medication management and review of any available recent lab and radiology data.  Preventive health is updated, specifically  Cancer screening and Immunization.   Questions or concerns regarding consultations or procedures which the PT has had in the interim are  addressed.Vertigo resolved without ENT intervention, additional w/up on hold pending recurrence Severe uncontrolled GERD with occasional hiccups, has GI f/u still has relatively high caffeine intake, needs none The PT denies any adverse reactions to current medications since the last visit.    ROS Denies recent fever or chills. Denies sinus pressure, nasal congestion, ear pain or sore throat. Denies chest congestion, productive cough or wheezing. Denies chest pains, palpitations and leg swelling Denies  vomiting,diarrhea or constipation.   Denies dysuria, frequency, hesitancy or incontinence. Denies joint pain, swelling and limitation in mobility. Denies headaches, seizures, numbness, or tingling. Denies depression, anxiety or insomnia. Denies skin break down or rash.   PE  BP 131/80   Pulse 73   Ht 5\' 9"  (1.753 m)   Wt 218 lb 3.2 oz (99 kg)   SpO2 91%   BMI 32.22 kg/m   Patient alert and oriented and in no cardiopulmonary distress.  HEENT: No facial asymmetry, EOMI,     Neck supple .  Chest: Clear to auscultation bilaterally.  CVS: S1, S2 no murmurs, no S3.Regular rate.  ABD: Soft non tender.   Ext: No edema  MS: Adequate though reduced  ROM spine, shoulders, hips and knees.  Skin: Intact, no ulcerations or rash noted.  Psych: Good eye contact, normal affect. Memory intact not anxious or depressed appearing.  CNS: CN 2-12 intact, power,   normal throughout.no focal deficits noted.   Assessment & Plan  Essential hypertension Controlled, no change in medication   Hyperlipidemia Hyperlipidemia:Low fat diet discussed and encouraged.   Lipid Panel  Lab Results  Component Value Date   CHOL 154 06/25/2023   HDL 50 06/25/2023   LDLCALC 78 06/25/2023   TRIG 150 (H) 06/25/2023   CHOLHDL 3.1 06/25/2023     Updated lab needed at/ before next visit. Needs to reduce fat in diet  Labyrinthitis, acute Resolved will f/u with ENT as needed  Obesity (BMI 30.0-34.9)  Patient re-educated about  the importance of commitment to a  minimum of 150 minutes of exercise per week as able.  The importance of healthy food choices with portion control discussed, as well as eating regularly and within a 12 hour window most days. The need to choose "clean , green" food 50 to 75% of the time is discussed, as well as to make water the primary drink and set a goal of 64 ounces water daily.       11/12/2023    1:17 PM 10/20/2023   10:53 AM 10/07/2023    9:20 AM  Weight /BMI  Weight 218 lb 3.2 oz 208 lb 208 lb 12.8 oz  Height 5\' 9"  (1.753 m) 5\' 9"  (1.753 m) 5\' 10"  (1.778 m)  BMI 32.22 kg/m2 30.72 kg/m2 29.96 kg/m2      GERD (gastroesophageal reflux disease) Symptomatic on max dose protonix, encouraged to d/c all caffeine and keep weight down, f/u with  GI  Gout No recent flare , check uric acid level , on allopiurinol

## 2023-11-12 NOTE — Patient Instructions (Signed)
F/U in 5 months, call if you need me sooner  Fasting lipid, cmp and EGFr and uric acid level in next 1 to 3 weeks  Staying away from caffeine  wILL lessen the reflux symptoms so worth working at this  It is important that you exercise regularly at least 30 minutes 5 times a week. If you develop chest pain, have severe difficulty breathing, or feel very tired, stop exercising immediately and seek medical attention   Best for 2025!  Thanks for choosing [facility], we consider it a privelige to serve you.

## 2023-11-12 NOTE — Assessment & Plan Note (Signed)
Hyperlipidemia:Low fat diet discussed and encouraged.   Lipid Panel  Lab Results  Component Value Date   CHOL 154 06/25/2023   HDL 50 06/25/2023   LDLCALC 78 06/25/2023   TRIG 150 (H) 06/25/2023   CHOLHDL 3.1 06/25/2023     Updated lab needed at/ before next visit. Needs to reduce fat in diet

## 2023-11-17 ENCOUNTER — Other Ambulatory Visit: Payer: Self-pay | Admitting: Family Medicine

## 2023-11-19 DIAGNOSIS — M1A9XX1 Chronic gout, unspecified, with tophus (tophi): Secondary | ICD-10-CM | POA: Diagnosis not present

## 2023-11-19 DIAGNOSIS — E782 Mixed hyperlipidemia: Secondary | ICD-10-CM | POA: Diagnosis not present

## 2023-11-19 DIAGNOSIS — I10 Essential (primary) hypertension: Secondary | ICD-10-CM | POA: Diagnosis not present

## 2023-11-20 ENCOUNTER — Encounter: Payer: Self-pay | Admitting: Family Medicine

## 2023-11-20 LAB — CMP14+EGFR
ALT: 20 [IU]/L (ref 0–44)
AST: 23 [IU]/L (ref 0–40)
Albumin: 4.4 g/dL (ref 3.7–4.7)
Alkaline Phosphatase: 55 [IU]/L (ref 44–121)
BUN/Creatinine Ratio: 6 — ABNORMAL LOW (ref 10–24)
BUN: 10 mg/dL (ref 8–27)
Bilirubin Total: 0.4 mg/dL (ref 0.0–1.2)
CO2: 21 mmol/L (ref 20–29)
Calcium: 9.2 mg/dL (ref 8.6–10.2)
Chloride: 108 mmol/L — ABNORMAL HIGH (ref 96–106)
Creatinine, Ser: 1.54 mg/dL — ABNORMAL HIGH (ref 0.76–1.27)
Globulin, Total: 2 g/dL (ref 1.5–4.5)
Glucose: 88 mg/dL (ref 70–99)
Potassium: 4.3 mmol/L (ref 3.5–5.2)
Sodium: 145 mmol/L — ABNORMAL HIGH (ref 134–144)
Total Protein: 6.4 g/dL (ref 6.0–8.5)
eGFR: 45 mL/min/{1.73_m2} — ABNORMAL LOW (ref >59)

## 2023-11-20 LAB — LIPID PANEL
Chol/HDL Ratio: 3.7 {ratio} (ref 0.0–5.0)
Cholesterol, Total: 172 mg/dL (ref 100–199)
HDL: 47 mg/dL (ref >39)
LDL Chol Calc (NIH): 96 mg/dL (ref 0–99)
Triglycerides: 170 mg/dL — ABNORMAL HIGH (ref 0–149)
VLDL Cholesterol Cal: 29 mg/dL (ref 5–40)

## 2023-11-20 LAB — URIC ACID: Uric Acid: 4.5 mg/dL (ref 3.8–8.4)

## 2023-11-25 ENCOUNTER — Ambulatory Visit: Payer: PPO | Admitting: Cardiology

## 2023-11-25 ENCOUNTER — Ambulatory Visit: Payer: PPO | Admitting: Family Medicine

## 2023-12-01 ENCOUNTER — Other Ambulatory Visit: Payer: Self-pay | Admitting: Family Medicine

## 2023-12-02 ENCOUNTER — Ambulatory Visit (INDEPENDENT_AMBULATORY_CARE_PROVIDER_SITE_OTHER): Payer: PPO

## 2023-12-02 VITALS — Ht 69.0 in | Wt 218.0 lb

## 2023-12-02 DIAGNOSIS — Z0000 Encounter for general adult medical examination without abnormal findings: Secondary | ICD-10-CM

## 2023-12-02 NOTE — Patient Instructions (Signed)
[name] [name] , Thank you for taking time to come for your Medicare Wellness Visit. I appreciate your ongoing commitment to your health goals. Please review the following plan we discussed and let me know if I can assist you in the future.   Referrals/Orders/Follow-Ups/Clinician Recommendations:  Next Medicare Annual Wellness Visit:December 06, 2024 at 10:00 am telephone visit  This is a list of the screening recommended for you and due dates:  Health Maintenance  Topic Date Due   Zoster (Shingles) Vaccine (1 of 2) 07/16/1992   DTaP/Tdap/Td vaccine (2 - Td or Tdap) 08/10/2021   COVID-19 Vaccine (6 - 2024-25 season) 06/21/2023   Medicare Annual Wellness Visit  12/01/2024   Pneumonia Vaccine  Completed   Flu Shot  Completed   HPV Vaccine  Aged Out   Hepatitis C Screening  Discontinued    Advanced directives: (Copy Requested) Please bring a copy of your health care power of attorney and living will to the office to be added to your chart at your convenience.  Next Medicare Annual Wellness Visit scheduled for next year: yes  Preventive Care 82 Years and Older, Male Preventive care refers to lifestyle choices and visits with your health care provider that can promote health and wellness. Preventive care visits are also called wellness exams. What can I expect for my preventive care visit? Counseling During your preventive care visit, your health care provider may ask about your: Medical history, including: Past medical problems. Family medical history. History of falls. Current health, including: Emotional well-being. Home life and relationship well-being. Sexual activity. Memory and ability to understand (cognition). Lifestyle, including: Alcohol, nicotine or tobacco, and drug use. Access to firearms. Diet, exercise, and sleep habits. Work and work Astronomer. Sunscreen use. Safety issues such as seatbelt and bike helmet use. Physical exam Your health care provider will check  your: Height and weight. These may be used to calculate your BMI (body mass index). BMI is a measurement that tells if you are at a healthy weight. Waist circumference. This measures the distance around your waistline. This measurement also tells if you are at a healthy weight and may help predict your risk of certain diseases, such as type 2 diabetes and high blood pressure. Heart rate and blood pressure. Body temperature. Skin for abnormal spots. What immunizations do I need?  Vaccines are usually given at various ages, according to a schedule. Your health care provider will recommend vaccines for you based on your age, medical history, and lifestyle or other factors, such as travel or where you work. What tests do I need? Screening Your health care provider may recommend screening tests for certain conditions. This may include: Lipid and cholesterol levels. Diabetes screening. This is done by checking your blood sugar (glucose) after you have not eaten for a while (fasting). Hepatitis C test. Hepatitis B test. HIV (human immunodeficiency virus) test. STI (sexually transmitted infection) testing, if you are at risk. Lung cancer screening. Colorectal cancer screening. Prostate cancer screening. Abdominal aortic aneurysm (AAA) screening. You may need this if you are a current or former smoker. Talk with your health care provider about your test results, treatment options, and if necessary, the need for more tests. Follow these instructions at home: Eating and drinking  Eat a diet that includes fresh fruits and vegetables, whole grains, lean protein, and low-fat dairy products. Limit your intake of foods with high amounts of sugar, saturated fats, and salt. Take vitamin and mineral supplements as recommended by your health care provider.  Do not drink alcohol if your health care provider tells you not to drink. If you drink alcohol: Limit how much you have to 0-2 drinks a day. Know how  much alcohol is in your drink. In the [place] one drink equals one 12 oz bottle of beer (355 mL), one 5 oz glass of wine (148 mL), or one 1 oz glass of hard liquor (44 mL). Lifestyle Brush your teeth every morning and night with fluoride toothpaste. Floss one time each day. Exercise for at least 30 minutes 5 or more days each week. Do not use any products that contain nicotine or tobacco. These products include cigarettes, chewing tobacco, and vaping devices, such as e-cigarettes. If you need help quitting, ask your health care provider. Do not use drugs. If you are sexually active, practice safe sex. Use a condom or other form of protection to prevent STIs. Take aspirin only as told by your health care provider. Make sure that you understand how much to take and what form to take. Work with your health care provider to find out whether it is safe and beneficial for you to take aspirin daily. Ask your health care provider if you need to take a cholesterol-lowering medicine (statin). Find healthy ways to manage stress, such as: Meditation, yoga, or listening to music. Journaling. Talking to a trusted person. Spending time with friends and family. Safety Always wear your seat belt while driving or riding in a vehicle. Do not drive: If you have been drinking alcohol. Do not ride with someone who has been drinking. When you are tired or distracted. While texting. If you have been using any mind-altering substances or drugs. Wear a helmet and other protective equipment during sports activities. If you have firearms in your house, make sure you follow all gun safety procedures. Minimize exposure to UV radiation to reduce your risk of skin cancer. What's next? Visit your health care provider once a year for an annual wellness visit. Ask your health care provider how often you should have your eyes and teeth checked. Stay up to date on all vaccines. This information is not intended to replace  advice given to you by your health care provider. Make sure you discuss any questions you have with your health care provider. Document Revised: 04/03/2021 Document Reviewed: 04/03/2021 Elsevier Patient Education  2024 [employer].  Understanding Your Risk for Falls Millions of people have serious injuries from falls each year. It is important to understand your risk of falling. Talk with your health care provider about your risk and what you can do to lower it. If you do have a serious fall, make sure to tell your provider. Falling once raises your risk of falling again. How can falls affect me? Serious injuries from falls are common. These include: Broken bones, such as hip fractures. Head injuries, such as traumatic brain injuries (TBI) or concussions. A fear of falling can cause you to avoid activities and stay at home. This can make your muscles weaker and raise your risk for a fall. What can increase my risk? There are a number of risk factors that increase your risk for falling. The more risk factors you have, the higher your risk of falling. Serious injuries from a fall happen most often to people who are older than 82 years old. Teenagers and young adults ages 33-29 are also at higher risk. Common risk factors include: Weakness in the lower body. Being generally weak or confused due to long-term (chronic) illness. Dizziness  or balance problems. Poor vision. Medicines that cause dizziness or drowsiness. These may include: Medicines for your blood pressure, heart, anxiety, insomnia, or swelling (edema). Pain medicines. Muscle relaxants. Other risk factors include: Drinking alcohol. Having had a fall in the past. Having foot pain or wearing improper footwear. Working at a dangerous job. Having any of the following in your home: Tripping hazards, such as floor clutter or loose rugs. Poor lighting. Pets. Having dementia or memory loss. What actions can I take to lower my risk  of falling?     Physical activity Stay physically fit. Do strength and balance exercises. Consider taking a regular class to build strength and balance. Yoga and tai chi are good options. Vision Have your eyes checked every year and your prescription for glasses or contacts updated as needed. Shoes and walking aids Wear non-skid shoes. Wear shoes that have rubber soles and low heels. Do not wear high heels. Do not walk around the house in socks or slippers. Use a cane or walker as told by your provider. Home safety Attach secure railings on both sides of your stairs. Install grab bars for your bathtub, shower, and toilet. Use a non-skid mat in your bathtub or shower. Attach bath mats securely with double-sided, non-slip rug tape. Use good lighting in all rooms. Keep a flashlight near your bed. Make sure there is a clear path from your bed to the bathroom. Use night-lights. Do not use throw rugs. Make sure all carpeting is taped or tacked down securely. Remove all clutter from walkways and stairways, including extension cords. Repair uneven or broken steps and floors. Avoid walking on icy or slippery surfaces. Walk on the grass instead of on icy or slick sidewalks. Use ice melter to get rid of ice on walkways in the winter. Use a cordless phone. Questions to ask your health care provider Can you help me check my risk for a fall? Do any of my medicines make me more likely to fall? Should I take a vitamin D supplement? What exercises can I do to improve my strength and balance? Should I make an appointment to have my vision checked? Do I need a bone density test to check for weak bones (osteoporosis)? Would it help to use a cane or a walker? Where to find more information Centers for Disease Control and Prevention, [employer]: TonerPromos.no Community-Based Fall Prevention Programs: TonerPromos.no [employer] on Aging: BaseRingTones.pl Contact a health care provider if: You fall at home. You are  afraid of falling at home. You feel weak, drowsy, or dizzy. This information is not intended to replace advice given to you by your health care provider. Make sure you discuss any questions you have with your health care provider. Document Revised: 06/09/2022 Document Reviewed: 06/09/2022 Elsevier Patient Education  2024 [employer].

## 2023-12-02 NOTE — Progress Notes (Signed)
Interactive audio and video telecommunications were attempted between this provider and patient, however failed, due to patient having technical difficulties OR patient did not have access to video capability.  We continued and completed visit with audio only. Because this visit was a virtual/telehealth visit,  certain criteria was not obtained, such a blood pressure, CBG if applicable, and timed get up and go. Any medications not marked as "taking" were not mentioned during the medication reconciliation part of the visit. Any vitals not documented were not able to be obtained due to this being a telehealth visit or patient was unable to self-report a recent blood pressure reading due to a lack of equipment at home via telehealth. Vitals that have been documented are verbally provided by the patient.   Subjective:   [name] [name] is a 83 y.o. male who presents for Medicare Annual/Subsequent preventive examination.  Visit Complete: Virtual I connected with  [name] [name] on 12/02/23 by a audio enabled telemedicine application and verified that I am speaking with the correct person using two identifiers.  Patient Location: Home  Provider Location: Home Office  I discussed the limitations of evaluation and management by telemedicine. The patient expressed understanding and agreed to proceed.  Vital Signs: Because this visit was a virtual/telehealth visit, some criteria may be missing or patient reported. Any vitals not documented were not able to be obtained and vitals that have been documented are patient reported.  Cardiac Risk Factors include: advanced age (>44men, >66 women);dyslipidemia;hypertension;male gender;obesity (BMI >30kg/m2);sedentary lifestyle     Objective:    Today's Vitals   12/02/23 1403  Weight: 218 lb (98.9 kg)  Height: 5\' 9"  (1.753 m)  PainSc: 0-No pain   Body mass index is 32.19 kg/m.     12/02/2023    2:04 PM 09/07/2023    1:27 PM 10/06/2022    9:39 AM  09/26/2020    8:16 AM 06/15/2020    7:52 AM 05/06/2020    1:16 PM 09/13/2018    9:56 AM  Advanced Directives  Does Patient Have a Medical Advance Directive? Yes No Yes Yes Yes No No  Type of Estate agent of [place];Living will  Healthcare Power of [place];Living will Healthcare Power of [place];Living will Healthcare Power of [place];Living will    Does patient want to make changes to medical advance directive?   No - Patient declined      Copy of Healthcare Power of Attorney in Chart? No - copy requested  No - copy requested No - copy requested No - copy requested    Would patient like information on creating a medical advance directive?  No - Patient declined     Yes (ED - Information included in AVS)    Current Medications (verified) Outpatient Encounter Medications as of 12/02/2023  Medication Sig   acetaminophen (TYLENOL) 325 MG tablet Take 650 mg by mouth as needed for mild pain.    allopurinol (ZYLOPRIM) 300 MG tablet Take 300 mg by mouth daily.   amLODipine (NORVASC) 2.5 MG tablet Take 1 tablet by mouth once daily   colchicine 0.6 MG tablet 1 capsule Orally every day to prevent gout flares for 30 days   famotidine (PEPCID) 40 MG tablet Take 1 tablet by mouth once daily   finasteride (PROSCAR) 5 MG tablet Take 1 tablet by mouth once daily   Krill Oil 300 MG CAPS Take 300 mg by mouth at bedtime.    loratadine (CLARITIN) 10 MG tablet Take 10 mg by  mouth daily.   meclizine (ANTIVERT) 25 MG tablet Take 1 tablet (25 mg total) by mouth 3 (three) times daily as needed for dizziness.   metoprolol tartrate (LOPRESSOR) 100 MG tablet Take 1 tablet by mouth twice daily   Misc Natural Products (BLACK CHERRY CONCENTRATE PO) Take 1 Dose by mouth daily.    Multiple Vitamin (MULTIVITAMIN WITH MINERALS) TABS Take 1 tablet by mouth daily.   nitroGLYCERIN (NITROSTAT) 0.4 MG SL tablet Take 1 tab under your tongue while sitting If no relief of chest pain may repeat one tab every 5  minutes up to 3 tablets total over 15 minutes (Patient taking differently: Place 0.4 mg under the tongue every 5 (five) minutes as needed for chest pain. up to 3 tablets total over 15 minutes)   ondansetron (ZOFRAN) 4 MG tablet Take 1 tablet (4 mg total) by mouth every 8 (eight) hours as needed for nausea or vomiting.   pantoprazole (PROTONIX) 40 MG tablet Take 1 tablet (40 mg total) by mouth 2 (two) times daily.   potassium chloride (KLOR-CON M) 10 MEQ tablet Take 1 tablet (10 mEq total) by mouth daily.   terazosin (HYTRIN) 2 MG capsule Take 1 capsule by mouth at bedtime   rosuvastatin (CRESTOR) 10 MG tablet Take 1 tablet (10 mg total) by mouth daily.   No facility-administered encounter medications on file as of 12/02/2023.    Allergies (verified) Ace inhibitors   History: Past Medical History:  Diagnosis Date   Anemia, iron deficiency    Annual physical exam 09/01/2016   Cancer (HCC)    skin cancers    Educated about COVID-19 virus infection 03/13/2019   ELECTROCARDIOGRAM, ABNORMAL 08/18/2010   Qualifier: Diagnosis of  By: Lodema Hong MD, Margaret     GERD (gastroesophageal reflux disease)    H/O hiatal hernia    Headache    Hemorrhoids    and minimal polp   History of kidney stones    History of migraines childhood    Flared up in 2009, and have responded to depakote which was stopped in 9 months   Hx of colonic polyps 02/24/2007   Hyperlipidemia 2003   Hypertension 2003   Low sodium levels 02/20/2017   LUNG NODULE 08/18/2010   Qualifier: Diagnosis of  By: Lodema Hong MD, Margaret  No need for radiologic f/u unless new symptoms develop    Lung nodules since 2009   CT scan advised in 08/2010   Neoplasm    dermal   Nonspecific abnormal findings on radiological and other examination of skull and head 08/18/2010   Qualifier: Diagnosis of  By: Lodema Hong MD, Margaret  Scan of 08/2010 showed no change over a 2 yr period, no need for further scanning.Pt is a non smoker, will not rept unless  symptomatic    Overweight(278.02)    Past Surgical History:  Procedure Laterality Date   COLONOSCOPY  2008   Fields-mod IH, 3mm polyp sigmoid colon (disappeared w/ insufflation & could not be retrieved)   COLONOSCOPY  05/19/2012   Procedure: COLONOSCOPY;  Surgeon: West Bali, MD;  Location: [facility];  Service: Endoscopy;  Laterality: N/A;  8:30   CYSTOSCOPY W/ URETERAL STENT PLACEMENT Left 11/27/2016   Procedure: CYSTOSCOPY WITH RETROGRADE PYELOGRAM/URETERAL STENT PLACEMENT;  Surgeon: Alfredo Martinez, MD;  Location: [facility];  Service: Urology;  Laterality: Left;   ESOPHAGOGASTRODUODENOSCOPY  01/05/2007   [facility], benign SB biopsy   EXTRACORPOREAL SHOCK WAVE LITHOTRIPSY Left 12/08/2016   Procedure: LEFT EXTRACORPOREAL SHOCK WAVE LITHOTRIPSY (  ESWL);  Surgeon: Jerilee Field, MD;  Location: [facility];  Service: Urology;  Laterality: Left;   GIVENS CAPSULE STUDY  01/20/2007   Fields-nothing to explain IDA, lymphectasias   LEFT HEART CATH AND CORONARY ANGIOGRAPHY N/A 06/15/2020   Procedure: LEFT HEART CATH AND CORONARY ANGIOGRAPHY;  Surgeon: [place], Peter M, MD;  Location: [facility] INVASIVE CV LAB;  Service: Cardiovascular;  Laterality: N/A;   removal of skin cancer twice     Family History  Problem Relation Age of Onset   Heart defect Mother        pacemaker    Alzheimer's disease Mother 12       2011   Heart defect Brother        bradycardia    Heart failure Father    Asthma Father    Hypertension Daughter    Kidney Stones Daughter    Colon cancer Neg Hx    Social History   Socioeconomic History   Marital status: Married    Spouse name: brenda    Number of children: 1   Years of education: 10   Highest education level: 10th grade  Occupational History   Occupation: retired, Marketing executive: SELF EMPLOYED  Tobacco Use   Smoking status: Former    Current packs/day: 0.00    Average packs/day: 2.0 packs/day for 15.0 years (30.0 ttl pk-yrs)    Types: Cigarettes     Start date: 10/21/1963    Quit date: 10/20/1978    Years since quitting: 45.1   Smokeless tobacco: Former    Types: Chew    Quit date: 10/20/2004  Vaping Use   Vaping status: Never Used  Substance and Sexual Activity   Alcohol use: No   Drug use: No   Sexual activity: Yes  Other Topics Concern   Not on file  Social History Narrative   1 healthy daughter   Lives alone with wife and dog    Social Drivers of Health   Financial Resource Strain: Low Risk  (12/02/2023)   Overall Financial Resource Strain (CARDIA)    Difficulty of Paying Living Expenses: Not hard at all  Food Insecurity: No Food Insecurity (12/02/2023)   Hunger Vital Sign    Worried About Running Out of Food in the Last Year: Never true    Ran Out of Food in the Last Year: Never true  Transportation Needs: No Transportation Needs (12/02/2023)   PRAPARE - Administrator, Civil Service (Medical): No    Lack of Transportation (Non-Medical): No  Physical Activity: Inactive (12/02/2023)   Exercise Vital Sign    Days of Exercise per Week: 0 days    Minutes of Exercise per Session: 0 min  Stress: No Stress Concern Present (12/02/2023)   [employer] of Occupational Health - Occupational Stress Questionnaire    Feeling of Stress : Not at all  Social Connections: Moderately Integrated (12/02/2023)   Social Connection and Isolation Panel [NHANES]    Frequency of Communication with Friends and Family: More than three times a week    Frequency of Social Gatherings with Friends and Family: More than three times a week    Attends Religious Services: More than 4 times per year    Active Member of [employer] or Organizations: No    Attends Banker Meetings: Never    Marital Status: Married    Tobacco Counseling Counseling given: Yes   Clinical Intake:  Pre-visit preparation completed: Yes  Pain : No/denies pain  Pain Score: 0-No pain     BMI - recorded: 32.19 Nutritional Status: BMI > 30   Obese Nutritional Risks: None Diabetes: No  How often do you need to have someone help you when you read instructions, pamphlets, or other written materials from your doctor or pharmacy?: 1 - Never  Interpreter Needed?: No  Information entered by :: Maryjean Ka CMA   Activities of Daily Living    12/02/2023    2:03 PM  In your present state of health, do you have any difficulty performing the following activities:  Hearing? 0  Vision? 0  Difficulty concentrating or making decisions? 0  Walking or climbing stairs? 0  Dressing or bathing? 0  Doing errands, shopping? 0  Preparing Food and eating ? N  Using the Toilet? N  In the past six months, have you accidently leaked urine? N  Do you have problems with loss of bowel control? N  Managing your Medications? N  Managing your Finances? N  Housekeeping or managing your Housekeeping? N    Patient Care Team: Kerri Perches, MD as PCP - General Mallipeddi, Orion Modest, MD as PCP - Cardiology (Cardiology) Marcelino Duster, MD as Referring Physician (Dermatology)  Indicate any recent Medical Services you may have received from other than [facility] providers in the past year (date may be approximate).     Assessment:   This is a routine wellness examination for [place].  Hearing/Vision screen Hearing Screening - Comments:: Patient denies any hearing difficulties.   Vision Screening - Comments:: Wears rx glasses - up to date with routine eye exams  Patient sees [facility] [place] office. He was made aware that the office does not have a provider as of right now. He will call to see if he can be seen at another location   Goals Addressed             This Visit's Progress    Patient Stated       Remain active and improve health so I don't have to go to so many doctors.        Depression Screen    12/02/2023    2:06 PM 11/12/2023    1:17 PM 09/24/2023    1:04 PM 09/01/2023    9:17 AM 06/25/2023    8:24 AM 10/06/2022    9:39 AM  10/06/2022    9:38 AM  PHQ 2/9 Scores  PHQ - 2 Score 0 0 0 0 1 0 0  PHQ- 9 Score 0 0   3      Fall Risk    12/02/2023    2:05 PM 11/12/2023    1:17 PM 09/24/2023    1:04 PM 09/16/2023   11:39 AM 09/01/2023    9:17 AM  Fall Risk   Falls in the past year? 0 0 0 0 0  Number falls in past yr: 0 0 0 0 0  Injury with Fall? 0 0 0 0 0  Risk for fall due to : No Fall Risks No Fall Risks No Fall Risks No Fall Risks No Fall Risks  Follow up Falls prevention discussed;Falls evaluation completed Falls evaluation completed Falls evaluation completed Falls evaluation completed Falls evaluation completed    MEDICARE RISK AT HOME: Medicare Risk at Home Any stairs in or around the home?: Yes If so, are there any without handrails?: No Home free of loose throw rugs in walkways, pet beds, electrical cords, etc?: Yes Adequate lighting in your home to reduce  risk of falls?: Yes Life alert?: Yes Use of a cane, walker or w/c?: No Grab bars in the bathroom?: No Shower chair or bench in shower?: Yes Elevated toilet seat or a handicapped toilet?: Yes  TIMED UP AND GO:  Was the test performed?  No    Cognitive Function:    10/06/2022    9:40 AM  MMSE - Mini Mental State Exam  Not completed: Unable to complete        12/02/2023    2:05 PM 10/06/2022    9:40 AM 09/27/2021    3:37 PM 09/26/2020    8:18 AM 09/26/2019    8:19 AM  6CIT Screen  What Year? 0 points 0 points 0 points 0 points 0 points  What month? 0 points 0 points 0 points 0 points 0 points  What time? 0 points 0 points  0 points 0 points  Count back from 20 0 points 0 points  0 points 0 points  Months in reverse 0 points 0 points  0 points 0 points  Repeat phrase 0 points 0 points  0 points 0 points  Total Score 0 points 0 points  0 points 0 points    Immunizations Immunization History  Administered Date(s) Administered   Fluad Quad(high Dose 65+) 07/25/2019, 07/27/2020, 06/25/2021, 07/09/2022   Fluad Trivalent(High Dose  65+) 06/25/2023   Influenza Whole 08/12/2010, 08/11/2011   Influenza, High Dose Seasonal PF 07/28/2018   Influenza,inj,Quad PF,6+ Mos 07/27/2013, 07/27/2014, 08/07/2015, 09/01/2016, 08/26/2017   Moderna Covid-19 Vaccine Bivalent Booster 69yrs & up 07/29/2022   Moderna SARS-COV2 Booster Vaccination 11/01/2019   Pneumococcal Conjugate-13 05/01/2014   Pneumococcal Polysaccharide-23 04/29/2013   Tdap 08/11/2011   Unspecified SARS-COV-2 Vaccination 11/01/2019, 12/02/2019, 09/06/2020   Zoster, Live 04/10/2014    TDAP status: Due, Education has been provided regarding the importance of this vaccine. Advised may receive this vaccine at local pharmacy or Health Dept. Aware to provide a copy of the vaccination record if obtained from local pharmacy or Health Dept. Verbalized acceptance and understanding.  Flu Vaccine status: Up to date  Pneumococcal vaccine status: Up to date  Covid-19 vaccine status: Information provided on how to obtain vaccines.   Qualifies for Shingles Vaccine? Yes   Zostavax completed Yes   Shingrix Completed?: No.    Education has been provided regarding the importance of this vaccine. Patient has been advised to call insurance company to determine out of pocket expense if they have not yet received this vaccine. Advised may also receive vaccine at local pharmacy or Health Dept. Verbalized acceptance and understanding.  Screening Tests Health Maintenance  Topic Date Due   Zoster Vaccines- Shingrix (1 of 2) 07/16/1992   DTaP/Tdap/Td (2 - Td or Tdap) 08/10/2021   COVID-19 Vaccine (6 - 2024-25 season) 06/21/2023   Medicare Annual Wellness (AWV)  06/24/2024   Pneumonia Vaccine 79+ Years old  Completed   INFLUENZA VACCINE  Completed   HPV VACCINES  Aged Out   Hepatitis C Screening  Discontinued    Health Maintenance  Health Maintenance Due  Topic Date Due   Zoster Vaccines- Shingrix (1 of 2) 07/16/1992   DTaP/Tdap/Td (2 - Td or Tdap) 08/10/2021   COVID-19 Vaccine  (6 - 2024-25 season) 06/21/2023    Colorectal cancer screening: No longer required.   Lung Cancer Screening: (Low Dose CT Chest recommended if Age 20-80 years, 20 pack-year currently smoking OR have quit w/in 15years.) does not qualify.   Lung Cancer Screening Referral: na  Additional Screening:  Hepatitis C Screening: does not qualify; Completed   Vision Screening: Recommended annual ophthalmology exams for early detection of glaucoma and other disorders of the eye. Is the patient up to date with their annual eye exam?  Yes  Who is the provider or what is the name of the office in which the patient attends annual eye exams? [facility] If pt is not established with a provider, would they like to be referred to a provider to establish care? No .   Dental Screening: Recommended annual dental exams for proper oral hygiene  Diabetic Foot Exam: na  Community Resource Referral / Chronic Care Management: CRR required this visit?  No   CCM required this visit?  No     Plan:     I have personally reviewed and noted the following in the patient's chart:   Medical and social history Use of alcohol, tobacco or illicit drugs  Current medications and supplements including opioid prescriptions. Patient is not currently taking opioid prescriptions. Functional ability and status Nutritional status Physical activity Advanced directives List of other physicians Hospitalizations, surgeries, and ER visits in previous 12 months Vitals Screenings to include cognitive, depression, and falls Referrals and appointments  In addition, I have reviewed and discussed with patient certain preventive protocols, quality metrics, and best practice recommendations. A written personalized care plan for preventive services as well as general preventive health recommendations were provided to patient.     Jordan Hawks Brooklee Michelin, CMA   12/02/2023   After Visit Summary: (Mail) Due to this being a  telephonic visit, the after visit summary with patients personalized plan was offered to patient via mail

## 2023-12-03 ENCOUNTER — Ambulatory Visit: Payer: PPO | Admitting: Gastroenterology

## 2023-12-07 DIAGNOSIS — L570 Actinic keratosis: Secondary | ICD-10-CM | POA: Diagnosis not present

## 2023-12-15 ENCOUNTER — Other Ambulatory Visit: Payer: Self-pay | Admitting: Family Medicine

## 2023-12-15 ENCOUNTER — Ambulatory Visit (HOSPITAL_COMMUNITY)
Admission: RE | Admit: 2023-12-15 | Discharge: 2023-12-15 | Disposition: A | Discharge status: Home / Self Care | Payer: PPO | Source: Ambulatory Visit | Attending: Internal Medicine | Admitting: Internal Medicine

## 2023-12-15 DIAGNOSIS — I351 Nonrheumatic aortic (valve) insufficiency: Secondary | ICD-10-CM | POA: Diagnosis not present

## 2023-12-15 LAB — ECHOCARDIOGRAM COMPLETE
Area-P 1/2: 3.39 cm2
S' Lateral: 3.2 cm

## 2023-12-17 DIAGNOSIS — L720 Epidermal cyst: Secondary | ICD-10-CM | POA: Diagnosis not present

## 2023-12-17 DIAGNOSIS — D485 Neoplasm of uncertain behavior of skin: Secondary | ICD-10-CM | POA: Diagnosis not present

## 2024-01-14 ENCOUNTER — Other Ambulatory Visit: Payer: Self-pay | Admitting: Family Medicine

## 2024-02-13 ENCOUNTER — Other Ambulatory Visit: Payer: Self-pay | Admitting: Family Medicine

## 2024-02-27 ENCOUNTER — Other Ambulatory Visit: Payer: Self-pay | Admitting: Family Medicine

## 2024-03-07 DIAGNOSIS — G5603 Carpal tunnel syndrome, bilateral upper limbs: Secondary | ICD-10-CM | POA: Diagnosis not present

## 2024-03-07 DIAGNOSIS — Z4789 Encounter for other orthopedic aftercare: Secondary | ICD-10-CM | POA: Diagnosis not present

## 2024-03-14 ENCOUNTER — Other Ambulatory Visit: Payer: Self-pay | Admitting: Family Medicine

## 2024-03-21 ENCOUNTER — Other Ambulatory Visit: Payer: Self-pay | Admitting: Family Medicine

## 2024-03-21 DIAGNOSIS — N401 Enlarged prostate with lower urinary tract symptoms: Secondary | ICD-10-CM

## 2024-03-21 DIAGNOSIS — Z4789 Encounter for other orthopedic aftercare: Secondary | ICD-10-CM | POA: Diagnosis not present

## 2024-03-21 DIAGNOSIS — G5601 Carpal tunnel syndrome, right upper limb: Secondary | ICD-10-CM | POA: Diagnosis not present

## 2024-04-04 DIAGNOSIS — M79641 Pain in right hand: Secondary | ICD-10-CM | POA: Diagnosis not present

## 2024-04-12 ENCOUNTER — Encounter: Payer: Self-pay | Admitting: Family Medicine

## 2024-04-12 ENCOUNTER — Other Ambulatory Visit: Payer: Self-pay | Admitting: Family Medicine

## 2024-04-12 ENCOUNTER — Ambulatory Visit (INDEPENDENT_AMBULATORY_CARE_PROVIDER_SITE_OTHER): Payer: PPO | Admitting: Family Medicine

## 2024-04-12 VITALS — BP 123/70 | HR 67 | Resp 16 | Ht 70.0 in | Wt 229.0 lb

## 2024-04-12 DIAGNOSIS — K219 Gastro-esophageal reflux disease without esophagitis: Secondary | ICD-10-CM | POA: Diagnosis not present

## 2024-04-12 DIAGNOSIS — I10 Essential (primary) hypertension: Secondary | ICD-10-CM

## 2024-04-12 DIAGNOSIS — E66811 Obesity, class 1: Secondary | ICD-10-CM

## 2024-04-12 DIAGNOSIS — E782 Mixed hyperlipidemia: Secondary | ICD-10-CM | POA: Diagnosis not present

## 2024-04-12 DIAGNOSIS — R42 Dizziness and giddiness: Secondary | ICD-10-CM

## 2024-04-12 DIAGNOSIS — M1A9XX1 Chronic gout, unspecified, with tophus (tophi): Secondary | ICD-10-CM | POA: Diagnosis not present

## 2024-04-12 NOTE — Patient Instructions (Signed)
 Annual exam in 5 months, call if you need em sooner  Fasting lipid, cmp and eGFR and uric acid level  in next 1 week  It is important that you exercise regularly at least 30 minutes 5 times a week. If you develop chest pain, have severe difficulty breathing, or feel very tired, stop exercising immediately and seek medical attention   It is important that you exercise regularly at least 30 minutes 5 times a week. If you develop chest pain, have severe difficulty breathing, or feel very tired, stop exercising immediately and seek medical attention   Think about what you will eat, plan ahead. Choose  clean, green, fresh or frozen over canned, processed or packaged foods which are more sugary, salty and fatty. 70 to 75% of food eaten should be vegetables and fruit. Three meals at set times with snacks allowed between meals, but they must be fruit or vegetables. Aim to eat over a 12 hour period , example 7 am to 7 pm, and STOP after  your last meal of the day. Drink water ,generally about 64 ounces per day, no other drink is as healthy. Fruit juice is best enjoyed in a healthy way, by EATING the fruit.  Thanks for choosing [facility], we consider it a privelige to serve you.

## 2024-04-14 DIAGNOSIS — I10 Essential (primary) hypertension: Secondary | ICD-10-CM | POA: Diagnosis not present

## 2024-04-14 DIAGNOSIS — M1A9XX1 Chronic gout, unspecified, with tophus (tophi): Secondary | ICD-10-CM | POA: Diagnosis not present

## 2024-04-14 DIAGNOSIS — E782 Mixed hyperlipidemia: Secondary | ICD-10-CM | POA: Diagnosis not present

## 2024-04-14 NOTE — Assessment & Plan Note (Signed)
 Controlled, no change in medication DASH diet and commitment to daily physical activity for a minimum of 30 minutes discussed and encouraged, as a part of hypertension management. The importance of attaining a healthy weight is also discussed.     04/12/2024    1:08 PM 12/02/2023    2:03 PM 11/12/2023    1:17 PM 10/20/2023   10:53 AM 10/07/2023    9:20 AM 09/24/2023    1:03 PM 09/16/2023   11:38 AM  BP/Weight  Systolic BP 123 -- 131 131 108 132 102  Diastolic BP 70 -- 80 79 72 83 67  Wt. (Lbs) 229 218 218.2 208 208.8 209.08 213  BMI 32.86 kg/m2 32.19 kg/m2 32.22 kg/m2 30.72 kg/m2 29.96 kg/m2 30 kg/m2 30.56 kg/m2

## 2024-04-14 NOTE — Assessment & Plan Note (Signed)
 Controlled, no change in medication

## 2024-04-14 NOTE — Assessment & Plan Note (Signed)
 Hyperlipidemia:Low fat diet discussed and encouraged.   Lipid Panel  Lab Results  Component Value Date   CHOL 172 11/19/2023   HDL 47 11/19/2023   LDLCALC 96 11/19/2023   TRIG 170 (H) 11/19/2023   CHOLHDL 3.7 11/19/2023     Updated lab needed at/ before next visit.  Needs to lower fat intake

## 2024-04-14 NOTE — Assessment & Plan Note (Signed)
 No recent flare, f/u uric acid level

## 2024-04-14 NOTE — Assessment & Plan Note (Signed)
  Patient re-educated about  the importance of commitment to a  minimum of 150 minutes of exercise per week as able.  The importance of healthy food choices with portion control discussed, as well as eating regularly and within a 12 hour window most days. The need to choose clean , green food 50 to 75% of the time is discussed, as well as to make water  the primary drink and set a goal of 64 ounces water  daily.       04/12/2024    1:08 PM 12/02/2023    2:03 PM 11/12/2023    1:17 PM  Weight /BMI  Weight 229 lb 218 lb 218 lb 3.2 oz  Height 5' 10 (1.778 m) 5' 9 (1.753 m) 5' 9 (1.753 m)  BMI 32.86 kg/m2 32.19 kg/m2 32.22 kg/m2    Needs to reduce caloric intake

## 2024-04-14 NOTE — Progress Notes (Signed)
 [name] [name]     MRN: [id number]      DOB: [birth year]  Chief Complaint  Patient presents with   Hypertension    5 month follow up     HPI [name] [name] is here for follow up and re-evaluation of chronic medical conditions, medication management and review of any available recent lab and radiology data.  Preventive health is updated, specifically  Cancer screening and Immunization.   Questions or concerns regarding consultations or procedures which the PT has had in the interim are  addressed. The PT denies any adverse reactions to current medications since the last visit.  There are no new concerns.  There are no specific complaints   ROS Denies recent fever or chills. Denies sinus pressure, nasal congestion, ear pain or sore throat. Denies chest congestion, productive cough or wheezing. Denies chest pains, palpitations and leg swelling Denies abdominal pain, nausea, vomiting,diarrhea or constipation.   Denies dysuria, frequency, hesitancy or incontinence. Denies uncontrolled  joint pain, swelling and limitation in mobility. Denies headaches, seizures, numbness, or tingling. Denies depression, anxiety or insomnia. Denies skin break down or rash.   PE  BP 123/70   Pulse 67   Resp 16   Ht 5' 10 (1.778 m)   Wt 229 lb (103.9 kg)   SpO2 93%   BMI 32.86 kg/m   Patient alert and oriented and in no cardiopulmonary distress.  HEENT: No facial asymmetry, EOMI,     Neck supple .  Chest: Clear to auscultation bilaterally.  CVS: S1, S2 no murmurs, no S3.Regular rate.  ABD: Soft non tender.   Ext: No edema  MS: Decreased  ROM spine, shoulders, hips and knees.  Skin: Intact, no ulcerations or rash noted.  Psych: Good eye contact, normal affect. Memory intact not anxious or depressed appearing.  CNS: CN 2-12 intact, power,  normal throughout.no focal deficits noted.   Assessment & Plan  Essential hypertension Controlled, no change in medication DASH diet and  commitment to daily physical activity for a minimum of 30 minutes discussed and encouraged, as a part of hypertension management. The importance of attaining a healthy weight is also discussed.     04/12/2024    1:08 PM 12/02/2023    2:03 PM 11/12/2023    1:17 PM 10/20/2023   10:53 AM 10/07/2023    9:20 AM 09/24/2023    1:03 PM 09/16/2023   11:38 AM  BP/Weight  Systolic BP 123 -- 131 131 108 132 102  Diastolic BP 70 -- 80 79 72 83 67  Wt. (Lbs) 229 218 218.2 208 208.8 209.08 213  BMI 32.86 kg/m2 32.19 kg/m2 32.22 kg/m2 30.72 kg/m2 29.96 kg/m2 30 kg/m2 30.56 kg/m2       Hyperlipidemia Hyperlipidemia:Low fat diet discussed and encouraged.   Lipid Panel  Lab Results  Component Value Date   CHOL 172 11/19/2023   HDL 47 11/19/2023   LDLCALC 96 11/19/2023   TRIG 170 (H) 11/19/2023   CHOLHDL 3.7 11/19/2023     Updated lab needed at/ before next visit.  Needs to lower fat intake  Gout No recent flare, f/u uric acid level  Vertigo Rsolved x 6 months  Obesity (BMI 30.0-34.9)  Patient re-educated about  the importance of commitment to a  minimum of 150 minutes of exercise per week as able.  The importance of healthy food choices with portion control discussed, as well as eating regularly and within a 12 hour window most days. The need to choose clean ,  green food 50 to 75% of the time is discussed, as well as to make water  the primary drink and set a goal of 64 ounces water  daily.       04/12/2024    1:08 PM 12/02/2023    2:03 PM 11/12/2023    1:17 PM  Weight /BMI  Weight 229 lb 218 lb 218 lb 3.2 oz  Height 5' 10 (1.778 m) 5' 9 (1.753 m) 5' 9 (1.753 m)  BMI 32.86 kg/m2 32.19 kg/m2 32.22 kg/m2    Needs to reduce caloric intake  GERD (gastroesophageal reflux disease) Controlled, no change in medication

## 2024-04-14 NOTE — Assessment & Plan Note (Signed)
 Rsolved x 6 months

## 2024-04-15 ENCOUNTER — Other Ambulatory Visit: Payer: Self-pay

## 2024-04-15 ENCOUNTER — Ambulatory Visit: Payer: Self-pay | Admitting: Family Medicine

## 2024-04-15 DIAGNOSIS — I10 Essential (primary) hypertension: Secondary | ICD-10-CM

## 2024-04-15 LAB — CMP14+EGFR
ALT: 22 IU/L (ref 0–44)
AST: 25 IU/L (ref 0–40)
Albumin: 4.5 g/dL (ref 3.7–4.7)
Alkaline Phosphatase: 70 IU/L (ref 44–121)
BUN/Creatinine Ratio: 11 (ref 10–24)
BUN: 20 mg/dL (ref 8–27)
Bilirubin Total: 0.4 mg/dL (ref 0.0–1.2)
CO2: 22 mmol/L (ref 20–29)
Calcium: 9.3 mg/dL (ref 8.6–10.2)
Chloride: 108 mmol/L — ABNORMAL HIGH (ref 96–106)
Creatinine, Ser: 1.87 mg/dL — ABNORMAL HIGH (ref 0.76–1.27)
Globulin, Total: 2 g/dL (ref 1.5–4.5)
Glucose: 87 mg/dL (ref 70–99)
Potassium: 4.8 mmol/L (ref 3.5–5.2)
Sodium: 144 mmol/L (ref 134–144)
Total Protein: 6.5 g/dL (ref 6.0–8.5)
eGFR: 36 mL/min/{1.73_m2} — ABNORMAL LOW (ref >59)

## 2024-04-15 LAB — LIPID PANEL
Chol/HDL Ratio: 3.5 ratio (ref 0.0–5.0)
Cholesterol, Total: 161 mg/dL (ref 100–199)
HDL: 46 mg/dL (ref >39)
LDL Chol Calc (NIH): 89 mg/dL (ref 0–99)
Triglycerides: 146 mg/dL (ref 0–149)
VLDL Cholesterol Cal: 26 mg/dL (ref 5–40)

## 2024-04-15 LAB — URIC ACID: Uric Acid: 6.5 mg/dL (ref 3.8–8.4)

## 2024-05-02 DIAGNOSIS — I10 Essential (primary) hypertension: Secondary | ICD-10-CM | POA: Diagnosis not present

## 2024-05-03 ENCOUNTER — Ambulatory Visit: Payer: Self-pay | Admitting: Family Medicine

## 2024-05-03 ENCOUNTER — Telehealth: Payer: Self-pay

## 2024-05-03 LAB — BMP8+EGFR
BUN/Creatinine Ratio: 7 — ABNORMAL LOW (ref 10–24)
BUN: 13 mg/dL (ref 8–27)
CO2: 21 mmol/L (ref 20–29)
Calcium: 8.8 mg/dL (ref 8.6–10.2)
Chloride: 110 mmol/L — ABNORMAL HIGH (ref 96–106)
Creatinine, Ser: 1.76 mg/dL — ABNORMAL HIGH (ref 0.76–1.27)
Glucose: 90 mg/dL (ref 70–99)
Potassium: 4.5 mmol/L (ref 3.5–5.2)
Sodium: 145 mmol/L — ABNORMAL HIGH (ref 134–144)
eGFR: 38 mL/min/1.73 — ABNORMAL LOW (ref >59)

## 2024-05-03 NOTE — Telephone Encounter (Signed)
 Copied from CRM [id number]. Topic: Clinical - Request for Lab/Test Order >> May 03, 2024  3:20 PM Turkey B wrote: Reason for CRM: pt and wife on line gave lab results

## 2024-05-18 ENCOUNTER — Other Ambulatory Visit: Payer: Self-pay | Admitting: Family Medicine

## 2024-05-22 ENCOUNTER — Encounter: Payer: Self-pay | Admitting: Family Medicine

## 2024-05-23 NOTE — Telephone Encounter (Signed)
 Called and spoke with patient- states he is feeling better and does not need an appt at the moment but will call if anything changes.

## 2024-05-25 ENCOUNTER — Other Ambulatory Visit: Payer: Self-pay | Admitting: Family Medicine

## 2024-06-01 DIAGNOSIS — M9902 Segmental and somatic dysfunction of thoracic region: Secondary | ICD-10-CM | POA: Diagnosis not present

## 2024-06-01 DIAGNOSIS — M9905 Segmental and somatic dysfunction of pelvic region: Secondary | ICD-10-CM | POA: Diagnosis not present

## 2024-06-01 DIAGNOSIS — M62830 Muscle spasm of back: Secondary | ICD-10-CM | POA: Diagnosis not present

## 2024-06-01 DIAGNOSIS — M9903 Segmental and somatic dysfunction of lumbar region: Secondary | ICD-10-CM | POA: Diagnosis not present

## 2024-06-02 ENCOUNTER — Other Ambulatory Visit: Payer: Self-pay | Admitting: Family Medicine

## 2024-06-03 DIAGNOSIS — M9905 Segmental and somatic dysfunction of pelvic region: Secondary | ICD-10-CM | POA: Diagnosis not present

## 2024-06-03 DIAGNOSIS — M9903 Segmental and somatic dysfunction of lumbar region: Secondary | ICD-10-CM | POA: Diagnosis not present

## 2024-06-03 DIAGNOSIS — M62830 Muscle spasm of back: Secondary | ICD-10-CM | POA: Diagnosis not present

## 2024-06-03 DIAGNOSIS — M9902 Segmental and somatic dysfunction of thoracic region: Secondary | ICD-10-CM | POA: Diagnosis not present

## 2024-06-06 DIAGNOSIS — M9905 Segmental and somatic dysfunction of pelvic region: Secondary | ICD-10-CM | POA: Diagnosis not present

## 2024-06-06 DIAGNOSIS — M9902 Segmental and somatic dysfunction of thoracic region: Secondary | ICD-10-CM | POA: Diagnosis not present

## 2024-06-06 DIAGNOSIS — M62830 Muscle spasm of back: Secondary | ICD-10-CM | POA: Diagnosis not present

## 2024-06-06 DIAGNOSIS — M9903 Segmental and somatic dysfunction of lumbar region: Secondary | ICD-10-CM | POA: Diagnosis not present

## 2024-06-15 DIAGNOSIS — L820 Inflamed seborrheic keratosis: Secondary | ICD-10-CM | POA: Diagnosis not present

## 2024-06-15 DIAGNOSIS — L570 Actinic keratosis: Secondary | ICD-10-CM | POA: Diagnosis not present

## 2024-06-15 DIAGNOSIS — D485 Neoplasm of uncertain behavior of skin: Secondary | ICD-10-CM | POA: Diagnosis not present

## 2024-06-17 ENCOUNTER — Other Ambulatory Visit: Payer: Self-pay | Admitting: Family Medicine

## 2024-06-17 DIAGNOSIS — N401 Enlarged prostate with lower urinary tract symptoms: Secondary | ICD-10-CM

## 2024-07-04 ENCOUNTER — Other Ambulatory Visit: Payer: Self-pay | Admitting: Family Medicine

## 2024-07-16 ENCOUNTER — Other Ambulatory Visit: Payer: Self-pay | Admitting: Family Medicine

## 2024-08-14 ENCOUNTER — Other Ambulatory Visit: Payer: Self-pay | Admitting: Family Medicine

## 2024-08-21 ENCOUNTER — Other Ambulatory Visit: Payer: Self-pay | Admitting: Family Medicine

## 2024-08-29 ENCOUNTER — Other Ambulatory Visit: Payer: Self-pay | Admitting: Family Medicine

## 2024-08-29 ENCOUNTER — Other Ambulatory Visit: Payer: Self-pay | Admitting: Internal Medicine

## 2024-09-09 DIAGNOSIS — M65331 Trigger finger, right middle finger: Secondary | ICD-10-CM | POA: Diagnosis not present

## 2024-09-09 DIAGNOSIS — M65321 Trigger finger, right index finger: Secondary | ICD-10-CM | POA: Diagnosis not present

## 2024-09-09 DIAGNOSIS — G5603 Carpal tunnel syndrome, bilateral upper limbs: Secondary | ICD-10-CM | POA: Diagnosis not present

## 2024-09-13 ENCOUNTER — Other Ambulatory Visit: Payer: Self-pay | Admitting: Family Medicine

## 2024-09-13 DIAGNOSIS — N401 Enlarged prostate with lower urinary tract symptoms: Secondary | ICD-10-CM

## 2024-09-21 ENCOUNTER — Ambulatory Visit: Admitting: Family Medicine

## 2024-09-21 ENCOUNTER — Encounter: Payer: Self-pay | Admitting: Family Medicine

## 2024-09-21 ENCOUNTER — Other Ambulatory Visit: Payer: Self-pay

## 2024-09-21 DIAGNOSIS — Z125 Encounter for screening for malignant neoplasm of prostate: Secondary | ICD-10-CM | POA: Diagnosis not present

## 2024-09-21 DIAGNOSIS — I10 Essential (primary) hypertension: Secondary | ICD-10-CM

## 2024-09-21 DIAGNOSIS — E559 Vitamin D deficiency, unspecified: Secondary | ICD-10-CM

## 2024-09-21 DIAGNOSIS — R7989 Other specified abnormal findings of blood chemistry: Secondary | ICD-10-CM | POA: Diagnosis not present

## 2024-09-21 DIAGNOSIS — K219 Gastro-esophageal reflux disease without esophagitis: Secondary | ICD-10-CM | POA: Diagnosis not present

## 2024-09-21 DIAGNOSIS — E7849 Other hyperlipidemia: Secondary | ICD-10-CM | POA: Diagnosis not present

## 2024-09-21 DIAGNOSIS — M1A9XX1 Chronic gout, unspecified, with tophus (tophi): Secondary | ICD-10-CM

## 2024-09-21 DIAGNOSIS — J209 Acute bronchitis, unspecified: Secondary | ICD-10-CM | POA: Diagnosis not present

## 2024-09-21 DIAGNOSIS — E782 Mixed hyperlipidemia: Secondary | ICD-10-CM

## 2024-09-21 DIAGNOSIS — E66811 Obesity, class 1: Secondary | ICD-10-CM | POA: Diagnosis not present

## 2024-09-21 MED ORDER — CEPHALEXIN 500 MG PO CAPS: 500.0000 mg | ORAL_CAPSULE | Freq: Two times a day (BID) | ORAL | 0 refills | Status: AC

## 2024-09-21 MED ORDER — ALBUTEROL SULFATE HFA 108 (90 BASE) MCG/ACT IN AERS: 2.0000 | INHALATION_SPRAY | Freq: Four times a day (QID) | RESPIRATORY_TRACT | 0 refills | Status: AC | PRN

## 2024-09-21 MED ORDER — PREDNISONE 5 MG PO TABS: 5.0000 mg | ORAL_TABLET | Freq: Two times a day (BID) | ORAL | 0 refills | Status: AC

## 2024-09-21 MED ORDER — BENZONATATE 100 MG PO CAPS: 100.0000 mg | ORAL_CAPSULE | Freq: Three times a day (TID) | ORAL | 0 refills | Status: AC | PRN

## 2024-09-21 NOTE — Patient Instructions (Addendum)
 Annual exam in 4 to 8 weeks  Fasting CBc, lipid, cmp and EGGFR, TSH , vit D and PSA and uric acid  mid January, 1 week before next visit  You are treated  for acute bronchitis, albuterol  inhaler, prednisone , keflex and tessalon  perles ( FOUR new medications) are prescribed  Cough suppressant syrup is already at the pharmacy  Thanks for choosing [facility], we consider it a privelige to serve you.

## 2024-09-22 ENCOUNTER — Telehealth: Payer: Self-pay

## 2024-09-22 NOTE — Telephone Encounter (Signed)
 Copied from CRM #[id number]. Topic: General - Call Back - No Documentation >> Sep 22, 2024  3:49 PM [name] [name] wrote: Reason for CRM: patient  is calling saying that cologaurd said that pt needs a new specimen please call pt back [phone]

## 2024-09-29 ENCOUNTER — Encounter: Payer: Self-pay | Admitting: Family Medicine

## 2024-09-29 ENCOUNTER — Other Ambulatory Visit: Payer: Self-pay

## 2024-09-29 DIAGNOSIS — J209 Acute bronchitis, unspecified: Secondary | ICD-10-CM | POA: Insufficient documentation

## 2024-09-29 DIAGNOSIS — Z1211 Encounter for screening for malignant neoplasm of colon: Secondary | ICD-10-CM

## 2024-09-29 DIAGNOSIS — Z125 Encounter for screening for malignant neoplasm of prostate: Secondary | ICD-10-CM | POA: Insufficient documentation

## 2024-09-29 DIAGNOSIS — R7989 Other specified abnormal findings of blood chemistry: Secondary | ICD-10-CM | POA: Insufficient documentation

## 2024-09-29 NOTE — Progress Notes (Signed)
 DOC MANDALA     MRN: [id number]      DOB: 08-05-1941  Chief Complaint  Patient presents with   Medical Management of Chronic Issues    5 month follow up    HPI [name] [name] is here for follow up and re-evaluation of chronic medical conditions, medication management and review of any available recent lab and radiology data.  3 day h/o  excess chest congestion with cough and wheeze, has also had chills , no documented fever, entire family is sick ROS Denies sinus pressure, nasal congestion, ear pain or sore throat. Denies chest pains, palpitations and leg swelling Denies abdominal pain, nausea, vomiting,diarrhea or constipation.   Denies dysuria, frequency, hesitancy or incontinence. Denies  uncontrolled joint pain, swelling and limitation in mobility. Denies headaches, seizures, numbness, or tingling. Denies depression, anxiety or insomnia. Denies skin break down or rash.   PE  BP 134/80   Pulse 70   Ht 5' 10 (1.778 m)   Wt 227 lb 1.9 oz (103 kg)   SpO2 (!) 88%   BMI 32.59 kg/m   Patient alert and oriented ess.  HEENT: No facial asymmetry, EOMI,     Neck supple .  Chest: decreased air entry, scattered wheezes and crackles  CVS: S1, S2 no murmurs, no S3.Regular rate.  ABD: Soft non tender.   Ext: No edema  MS: decreased though Adequate ROM spine, shoulders, hips and knees.  Skin: Intact, no ulcerations or rash noted.  Psych: Good eye contact, normal affect. Memory intact not anxious or depressed appearing.  CNS: CN 2-12 intact, power,  normal throughout.no focal deficits noted.   Assessment & Plan Acute bronchitis Albuterol , keflex , tessalon  perles and prednisone  prescribed  Essential hypertension Controlled, no change in medication DASH diet and commitment to daily physical activity for a minimum of 30 minutes discussed and encouraged, as a part of hypertension management. The importance of attaining a healthy weight is also discussed.      09/21/2024    1:08 PM 04/12/2024    1:08 PM 12/02/2023    2:03 PM 11/12/2023    1:17 PM 10/20/2023   10:53 AM 10/07/2023    9:20 AM 09/24/2023    1:03 PM  BP/Weight  Systolic BP 134 123 -- 131 131 108 132  Diastolic BP 80 70 -- 80 79 72 83  Wt. (Lbs) 227.12 229 218 218.2 208 208.8 209.08  BMI 32.59 kg/m2 32.86 kg/m2 32.19 kg/m2 32.22 kg/m2 30.72 kg/m2 29.96 kg/m2 30 kg/m2       Hyperlipidemia Hyperlipidemia:Low fat diet discussed and encouraged.   Lipid Panel  Lab Results  Component Value Date   CHOL 161 04/14/2024   HDL 46 04/14/2024   LDLCALC 89 04/14/2024   TRIG 146 04/14/2024   CHOLHDL 3.5 04/14/2024     Updated lab needed at/ before next visit.   BPH with lower urinary tract symptoms without urinary obstruction Continue current meds for adequate urine flow  GERD (gastroesophageal reflux disease) Controlled, no change in medication   Gout No recent flare check uric acid level   Low vitamin D  level Updated lab needed at/ before next visit.   Obesity (BMI 30.0-34.9)  Patient re-educated about  the importance of commitment to a  minimum of 150 minutes of exercise per week as able.  The importance of healthy food choices with portion control discussed, as well as eating regularly and within a 12 hour window most days. The need to choose clean , green food 50  to 75% of the time is discussed, as well as to make water  the primary drink and set a goal of 64 ounces water  daily.       09/21/2024    1:08 PM 04/12/2024    1:08 PM 12/02/2023    2:03 PM  Weight /BMI  Weight 227 lb 1.9 oz 229 lb 218 lb  Height 5' 10 (1.778 m) 5' 10 (1.778 m) 5' 9 (1.753 m)  BMI 32.59 kg/m2 32.86 kg/m2 32.19 kg/m2

## 2024-09-29 NOTE — Assessment & Plan Note (Signed)
 Controlled, no change in medication DASH diet and commitment to daily physical activity for a minimum of 30 minutes discussed and encouraged, as a part of hypertension management. The importance of attaining a healthy weight is also discussed.     09/21/2024    1:08 PM 04/12/2024    1:08 PM 12/02/2023    2:03 PM 11/12/2023    1:17 PM 10/20/2023   10:53 AM 10/07/2023    9:20 AM 09/24/2023    1:03 PM  BP/Weight  Systolic BP 134 123 -- 131 131 108 132  Diastolic BP 80 70 -- 80 79 72 83  Wt. (Lbs) 227.12 229 218 218.2 208 208.8 209.08  BMI 32.59 kg/m2 32.86 kg/m2 32.19 kg/m2 32.22 kg/m2 30.72 kg/m2 29.96 kg/m2 30 kg/m2

## 2024-09-29 NOTE — Assessment & Plan Note (Signed)
 Controlled, no change in medication

## 2024-09-29 NOTE — Assessment & Plan Note (Signed)
 Albuterol , keflex , tessalon  perles and prednisone  prescribed

## 2024-09-29 NOTE — Assessment & Plan Note (Signed)
No recent flare check uric acid level

## 2024-09-29 NOTE — Assessment & Plan Note (Signed)
 Continue current meds for adequate urine flow

## 2024-09-29 NOTE — Telephone Encounter (Signed)
 Ordered

## 2024-09-29 NOTE — Assessment & Plan Note (Signed)
 Hyperlipidemia:Low fat diet discussed and encouraged.   Lipid Panel  Lab Results  Component Value Date   CHOL 161 04/14/2024   HDL 46 04/14/2024   LDLCALC 89 04/14/2024   TRIG 146 04/14/2024   CHOLHDL 3.5 04/14/2024     Updated lab needed at/ before next visit.

## 2024-09-29 NOTE — Assessment & Plan Note (Signed)
 Updated lab needed at/ before next visit.

## 2024-09-29 NOTE — Assessment & Plan Note (Signed)
°  Patient re-educated about  the importance of commitment to a  minimum of 150 minutes of exercise per week as able.  The importance of healthy food choices with portion control discussed, as well as eating regularly and within a 12 hour window most days. The need to choose clean , green food 50 to 75% of the time is discussed, as well as to make water  the primary drink and set a goal of 64 ounces water  daily.       09/21/2024    1:08 PM 04/12/2024    1:08 PM 12/02/2023    2:03 PM  Weight /BMI  Weight 227 lb 1.9 oz 229 lb 218 lb  Height 5' 10 (1.778 m) 5' 10 (1.778 m) 5' 9 (1.753 m)  BMI 32.59 kg/m2 32.86 kg/m2 32.19 kg/m2

## 2024-10-11 ENCOUNTER — Other Ambulatory Visit: Payer: Self-pay | Admitting: Family Medicine

## 2024-10-11 ENCOUNTER — Other Ambulatory Visit: Payer: Self-pay | Admitting: Internal Medicine

## 2024-10-18 LAB — COLOGUARD: COLOGUARD: NEGATIVE

## 2024-10-19 ENCOUNTER — Other Ambulatory Visit: Payer: Self-pay | Admitting: Internal Medicine

## 2024-10-19 ENCOUNTER — Ambulatory Visit: Payer: Self-pay | Admitting: Family Medicine

## 2024-11-06 ENCOUNTER — Other Ambulatory Visit: Payer: Self-pay | Admitting: Internal Medicine

## 2024-11-07 ENCOUNTER — Encounter: Payer: Self-pay | Admitting: Family Medicine

## 2024-11-08 ENCOUNTER — Telehealth: Payer: Self-pay | Admitting: Pharmacy Technician

## 2024-11-08 ENCOUNTER — Other Ambulatory Visit (HOSPITAL_COMMUNITY): Payer: Self-pay

## 2024-11-08 MED ORDER — CHLORPHENIRAMINE MALEATE 4 MG PO TABS: ORAL_TABLET | ORAL | 0 refills | Status: AC

## 2024-11-08 MED ORDER — MECLIZINE HCL 25 MG PO TABS: 25.0000 mg | ORAL_TABLET | Freq: Three times a day (TID) | ORAL | 0 refills | Status: AC | PRN

## 2024-11-08 MED ORDER — FLUTICASONE PROPIONATE 50 MCG/ACT NA SUSP: 2.0000 | Freq: Every day | NASAL | 6 refills | Status: AC

## 2024-11-08 MED ORDER — ONDANSETRON 4 MG PO TBDP: 4.0000 mg | ORAL_TABLET | Freq: Three times a day (TID) | ORAL | 0 refills | Status: AC | PRN

## 2024-11-08 NOTE — Telephone Encounter (Signed)
 Pharmacy Patient Advocate Encounter   Received notification from [facility] that prior authorization for Ondansetron  4mg  dispersible is required/requested.   Insurance verification completed.   The patient is insured through [facility] [employer].   Per test claim: Medication is not covered by his Part D plan. He should be able to pay out of pocket for a reasonable amount.

## 2024-11-16 ENCOUNTER — Other Ambulatory Visit: Payer: Self-pay | Admitting: Family Medicine

## 2024-11-20 ENCOUNTER — Other Ambulatory Visit: Payer: Self-pay | Admitting: Family Medicine

## 2024-11-25 ENCOUNTER — Other Ambulatory Visit: Payer: Self-pay | Admitting: Family Medicine

## 2024-12-06 ENCOUNTER — Ambulatory Visit: Payer: PPO

## 2024-12-07 ENCOUNTER — Encounter: Admitting: Family Medicine
